# Patient Record
Sex: Female | Born: 1955 | Race: Black or African American | Hispanic: No | Smoking: Never smoker
Health system: Southern US, Community
[De-identification: ages and names within clinical notes are randomized; demographics above are authoritative.]

## PROBLEM LIST (undated history)

## (undated) DIAGNOSIS — K219 Gastro-esophageal reflux disease without esophagitis: Secondary | ICD-10-CM

## (undated) DIAGNOSIS — B20 Human immunodeficiency virus [HIV] disease: Secondary | ICD-10-CM

## (undated) DIAGNOSIS — I10 Essential (primary) hypertension: Secondary | ICD-10-CM

## (undated) HISTORY — PX: ECTOPIC PREGNANCY SURGERY: SHX613

## (undated) HISTORY — PX: UTERINE FIBROID SURGERY: SHX826

## (undated) HISTORY — PX: BUNIONECTOMY: SHX129

---

## 1997-08-10 ENCOUNTER — Emergency Department (HOSPITAL_COMMUNITY): Admission: EM | Admit: 1997-08-10 | Discharge: 1997-08-10 | Payer: Self-pay | Admitting: Emergency Medicine

## 1998-02-24 ENCOUNTER — Emergency Department (HOSPITAL_COMMUNITY): Admission: EM | Admit: 1998-02-24 | Discharge: 1998-02-24 | Payer: Self-pay | Admitting: Emergency Medicine

## 1999-07-22 ENCOUNTER — Encounter: Payer: Self-pay | Admitting: Emergency Medicine

## 1999-07-22 ENCOUNTER — Emergency Department (HOSPITAL_COMMUNITY): Admission: EM | Admit: 1999-07-22 | Discharge: 1999-07-22 | Payer: Self-pay | Admitting: Emergency Medicine

## 1999-11-10 ENCOUNTER — Emergency Department (HOSPITAL_COMMUNITY): Admission: EM | Admit: 1999-11-10 | Discharge: 1999-11-10 | Payer: Self-pay | Admitting: Emergency Medicine

## 2000-01-18 ENCOUNTER — Emergency Department (HOSPITAL_COMMUNITY): Admission: EM | Admit: 2000-01-18 | Discharge: 2000-01-18 | Payer: Self-pay | Admitting: *Deleted

## 2001-06-22 ENCOUNTER — Emergency Department (HOSPITAL_COMMUNITY): Admission: EM | Admit: 2001-06-22 | Discharge: 2001-06-22 | Payer: Self-pay

## 2001-07-23 ENCOUNTER — Encounter: Admission: RE | Admit: 2001-07-23 | Discharge: 2001-07-23 | Payer: Self-pay | Admitting: Obstetrics and Gynecology

## 2001-07-23 ENCOUNTER — Other Ambulatory Visit: Admission: RE | Admit: 2001-07-23 | Discharge: 2001-07-23 | Payer: Self-pay | Admitting: Obstetrics and Gynecology

## 2001-11-20 ENCOUNTER — Emergency Department (HOSPITAL_COMMUNITY): Admission: EM | Admit: 2001-11-20 | Discharge: 2001-11-20 | Payer: Self-pay | Admitting: Emergency Medicine

## 2002-07-14 ENCOUNTER — Emergency Department (HOSPITAL_COMMUNITY): Admission: EM | Admit: 2002-07-14 | Discharge: 2002-07-14 | Payer: Self-pay | Admitting: Emergency Medicine

## 2003-01-24 ENCOUNTER — Ambulatory Visit (HOSPITAL_COMMUNITY): Admission: RE | Admit: 2003-01-24 | Discharge: 2003-01-24 | Payer: Self-pay | Admitting: Obstetrics and Gynecology

## 2003-02-22 ENCOUNTER — Encounter (INDEPENDENT_AMBULATORY_CARE_PROVIDER_SITE_OTHER): Payer: Self-pay | Admitting: *Deleted

## 2003-02-22 ENCOUNTER — Encounter: Admission: RE | Admit: 2003-02-22 | Discharge: 2003-02-22 | Payer: Self-pay | Admitting: Obstetrics and Gynecology

## 2003-08-06 ENCOUNTER — Emergency Department (HOSPITAL_COMMUNITY): Admission: EM | Admit: 2003-08-06 | Discharge: 2003-08-06 | Payer: Self-pay | Admitting: Emergency Medicine

## 2003-10-30 ENCOUNTER — Emergency Department (HOSPITAL_COMMUNITY): Admission: EM | Admit: 2003-10-30 | Discharge: 2003-10-30 | Payer: Self-pay | Admitting: Emergency Medicine

## 2004-01-18 ENCOUNTER — Emergency Department (HOSPITAL_COMMUNITY): Admission: EM | Admit: 2004-01-18 | Discharge: 2004-01-18 | Payer: Self-pay | Admitting: Emergency Medicine

## 2004-01-27 ENCOUNTER — Ambulatory Visit: Payer: Self-pay | Admitting: Family Medicine

## 2004-09-21 ENCOUNTER — Emergency Department (HOSPITAL_COMMUNITY): Admission: EM | Admit: 2004-09-21 | Discharge: 2004-09-21 | Payer: Self-pay | Admitting: Family Medicine

## 2004-11-23 ENCOUNTER — Emergency Department (HOSPITAL_COMMUNITY): Admission: EM | Admit: 2004-11-23 | Discharge: 2004-11-23 | Payer: Self-pay | Admitting: Emergency Medicine

## 2004-12-17 ENCOUNTER — Emergency Department (HOSPITAL_COMMUNITY): Admission: EM | Admit: 2004-12-17 | Discharge: 2004-12-17 | Payer: Self-pay | Admitting: Emergency Medicine

## 2005-02-04 ENCOUNTER — Emergency Department (HOSPITAL_COMMUNITY): Admission: EM | Admit: 2005-02-04 | Discharge: 2005-02-04 | Payer: Self-pay | Admitting: Family Medicine

## 2005-03-11 DIAGNOSIS — Z21 Asymptomatic human immunodeficiency virus [HIV] infection status: Secondary | ICD-10-CM

## 2005-03-11 DIAGNOSIS — B20 Human immunodeficiency virus [HIV] disease: Secondary | ICD-10-CM

## 2005-03-11 HISTORY — DX: Asymptomatic human immunodeficiency virus (hiv) infection status: Z21

## 2005-03-11 HISTORY — DX: Human immunodeficiency virus (HIV) disease: B20

## 2005-03-24 ENCOUNTER — Emergency Department (HOSPITAL_COMMUNITY): Admission: EM | Admit: 2005-03-24 | Discharge: 2005-03-24 | Payer: Self-pay | Admitting: Emergency Medicine

## 2005-05-08 ENCOUNTER — Emergency Department (HOSPITAL_COMMUNITY): Admission: EM | Admit: 2005-05-08 | Discharge: 2005-05-08 | Payer: Self-pay | Admitting: Emergency Medicine

## 2005-05-21 ENCOUNTER — Emergency Department (HOSPITAL_COMMUNITY): Admission: EM | Admit: 2005-05-21 | Discharge: 2005-05-21 | Payer: Self-pay | Admitting: Emergency Medicine

## 2005-05-23 ENCOUNTER — Encounter: Admission: RE | Admit: 2005-05-23 | Discharge: 2005-05-23 | Payer: Self-pay | Admitting: Internal Medicine

## 2005-05-23 ENCOUNTER — Encounter (INDEPENDENT_AMBULATORY_CARE_PROVIDER_SITE_OTHER): Payer: Self-pay | Admitting: *Deleted

## 2005-05-23 ENCOUNTER — Ambulatory Visit: Payer: Self-pay | Admitting: Internal Medicine

## 2005-05-31 ENCOUNTER — Ambulatory Visit: Payer: Self-pay | Admitting: Cardiology

## 2005-06-01 ENCOUNTER — Inpatient Hospital Stay (HOSPITAL_COMMUNITY): Admission: EM | Admit: 2005-06-01 | Discharge: 2005-06-07 | Payer: Self-pay | Admitting: Emergency Medicine

## 2005-06-01 ENCOUNTER — Ambulatory Visit: Payer: Self-pay | Admitting: Internal Medicine

## 2005-06-01 ENCOUNTER — Ambulatory Visit: Payer: Self-pay | Admitting: Emergency Medicine

## 2005-06-01 ENCOUNTER — Ambulatory Visit: Payer: Self-pay | Admitting: Infectious Diseases

## 2005-06-04 ENCOUNTER — Encounter: Payer: Self-pay | Admitting: Cardiology

## 2005-06-18 ENCOUNTER — Ambulatory Visit: Payer: Self-pay | Admitting: Internal Medicine

## 2005-06-23 ENCOUNTER — Inpatient Hospital Stay (HOSPITAL_COMMUNITY): Admission: EM | Admit: 2005-06-23 | Discharge: 2005-06-26 | Payer: Self-pay | Admitting: Emergency Medicine

## 2005-06-29 ENCOUNTER — Emergency Department (HOSPITAL_COMMUNITY): Admission: EM | Admit: 2005-06-29 | Discharge: 2005-06-29 | Payer: Self-pay | Admitting: Emergency Medicine

## 2005-07-02 ENCOUNTER — Ambulatory Visit: Payer: Self-pay | Admitting: Internal Medicine

## 2005-07-04 ENCOUNTER — Ambulatory Visit: Payer: Self-pay | Admitting: Internal Medicine

## 2005-07-08 ENCOUNTER — Ambulatory Visit: Payer: Self-pay | Admitting: Internal Medicine

## 2005-08-07 ENCOUNTER — Ambulatory Visit: Payer: Self-pay | Admitting: Internal Medicine

## 2005-08-08 ENCOUNTER — Ambulatory Visit: Payer: Self-pay | Admitting: Internal Medicine

## 2005-08-22 ENCOUNTER — Ambulatory Visit: Payer: Self-pay | Admitting: Internal Medicine

## 2005-09-12 ENCOUNTER — Ambulatory Visit: Payer: Self-pay | Admitting: Internal Medicine

## 2005-10-10 ENCOUNTER — Ambulatory Visit: Payer: Self-pay | Admitting: Obstetrics & Gynecology

## 2005-10-10 ENCOUNTER — Encounter (INDEPENDENT_AMBULATORY_CARE_PROVIDER_SITE_OTHER): Payer: Self-pay | Admitting: Specialist

## 2005-10-21 ENCOUNTER — Ambulatory Visit: Payer: Self-pay | Admitting: Gynecology

## 2005-10-21 ENCOUNTER — Ambulatory Visit (HOSPITAL_COMMUNITY): Admission: RE | Admit: 2005-10-21 | Discharge: 2005-10-21 | Payer: Self-pay | Admitting: Family Medicine

## 2005-10-28 ENCOUNTER — Ambulatory Visit: Payer: Self-pay | Admitting: Internal Medicine

## 2005-10-28 ENCOUNTER — Encounter (INDEPENDENT_AMBULATORY_CARE_PROVIDER_SITE_OTHER): Payer: Self-pay | Admitting: *Deleted

## 2005-10-28 LAB — CONVERTED CEMR LAB
CD4 Count: 96 microliters
HIV 1 RNA Quant: 49 copies/mL

## 2005-11-20 ENCOUNTER — Ambulatory Visit: Payer: Self-pay | Admitting: Internal Medicine

## 2005-12-03 ENCOUNTER — Ambulatory Visit: Payer: Self-pay | Admitting: Gastroenterology

## 2005-12-05 ENCOUNTER — Ambulatory Visit: Payer: Self-pay | Admitting: Obstetrics & Gynecology

## 2005-12-05 ENCOUNTER — Encounter (INDEPENDENT_AMBULATORY_CARE_PROVIDER_SITE_OTHER): Payer: Self-pay | Admitting: Specialist

## 2005-12-05 ENCOUNTER — Other Ambulatory Visit: Admission: RE | Admit: 2005-12-05 | Discharge: 2005-12-05 | Payer: Self-pay | Admitting: Obstetrics and Gynecology

## 2005-12-13 ENCOUNTER — Ambulatory Visit: Payer: Self-pay | Admitting: Gastroenterology

## 2005-12-25 ENCOUNTER — Ambulatory Visit: Payer: Self-pay | Admitting: Internal Medicine

## 2005-12-25 ENCOUNTER — Ambulatory Visit: Payer: Self-pay | Admitting: Infectious Diseases

## 2005-12-25 ENCOUNTER — Encounter (INDEPENDENT_AMBULATORY_CARE_PROVIDER_SITE_OTHER): Payer: Self-pay | Admitting: *Deleted

## 2005-12-25 LAB — CONVERTED CEMR LAB
ALT: 13 units/L (ref 0–40)
Alkaline Phosphatase: 103 units/L (ref 39–117)
Bilirubin Urine: NEGATIVE
Calcium: 9.5 mg/dL (ref 8.4–10.5)
Cholesterol: 235 mg/dL — ABNORMAL HIGH (ref 0–200)
HDL: 74 mg/dL (ref >39)
HIV 1 RNA Quant: 52 copies/mL
Hgb urine dipstick: NEGATIVE
Phosphorus: 4.3 mg/dL (ref 2.3–4.6)
Specific Gravity, Urine: 1.017 (ref 1.005–1.03)
TSH: 1.397 microintl units/mL (ref 0.350–5.50)
Total CHOL/HDL Ratio: 3.2
pH: 6 (ref 5.0–8.0)

## 2006-01-13 DIAGNOSIS — O009 Unspecified ectopic pregnancy without intrauterine pregnancy: Secondary | ICD-10-CM

## 2006-01-13 DIAGNOSIS — Z9889 Other specified postprocedural states: Secondary | ICD-10-CM

## 2006-01-13 DIAGNOSIS — Z9189 Other specified personal risk factors, not elsewhere classified: Secondary | ICD-10-CM

## 2006-01-13 DIAGNOSIS — D649 Anemia, unspecified: Secondary | ICD-10-CM

## 2006-01-13 DIAGNOSIS — K219 Gastro-esophageal reflux disease without esophagitis: Secondary | ICD-10-CM | POA: Insufficient documentation

## 2006-01-13 DIAGNOSIS — F1420 Cocaine dependence, uncomplicated: Secondary | ICD-10-CM | POA: Insufficient documentation

## 2006-01-13 DIAGNOSIS — T783XXA Angioneurotic edema, initial encounter: Secondary | ICD-10-CM | POA: Insufficient documentation

## 2006-01-13 DIAGNOSIS — J309 Allergic rhinitis, unspecified: Secondary | ICD-10-CM | POA: Insufficient documentation

## 2006-01-13 DIAGNOSIS — O0384 Damage to pelvic organs following complete or unspecified spontaneous abortion: Secondary | ICD-10-CM | POA: Insufficient documentation

## 2006-01-13 DIAGNOSIS — A6000 Herpesviral infection of urogenital system, unspecified: Secondary | ICD-10-CM | POA: Insufficient documentation

## 2006-03-17 ENCOUNTER — Encounter (INDEPENDENT_AMBULATORY_CARE_PROVIDER_SITE_OTHER): Payer: Self-pay | Admitting: *Deleted

## 2006-03-17 ENCOUNTER — Ambulatory Visit: Payer: Self-pay | Admitting: Internal Medicine

## 2006-03-17 LAB — CONVERTED CEMR LAB
ALT: 9 units/L (ref 0–35)
Albumin: 4.1 g/dL (ref 3.5–5.2)
Bilirubin, Direct: <0.1 mg/dL (ref 0.0–0.3)
CD4 Count: 262 microliters
CO2: 28 meq/L (ref 19–32)
Calcium: 9.5 mg/dL (ref 8.4–10.5)
Chloride: 106 meq/L (ref 96–112)
Creatinine, Ser: 0.76 mg/dL (ref 0.40–1.20)
HIV 1 RNA Quant: 49 copies/mL
Total Protein: 8 g/dL (ref 6.0–8.3)
VLDL: 18 mg/dL (ref 0–40)

## 2006-05-05 ENCOUNTER — Encounter (INDEPENDENT_AMBULATORY_CARE_PROVIDER_SITE_OTHER): Payer: Self-pay | Admitting: *Deleted

## 2006-05-05 LAB — CONVERTED CEMR LAB: Pap Smear: ABNORMAL

## 2006-05-18 ENCOUNTER — Encounter (INDEPENDENT_AMBULATORY_CARE_PROVIDER_SITE_OTHER): Payer: Self-pay | Admitting: *Deleted

## 2006-06-10 ENCOUNTER — Telehealth: Payer: Self-pay | Admitting: Internal Medicine

## 2006-06-11 ENCOUNTER — Ambulatory Visit: Payer: Self-pay | Admitting: Internal Medicine

## 2006-06-11 ENCOUNTER — Encounter: Payer: Self-pay | Admitting: Internal Medicine

## 2006-06-11 ENCOUNTER — Encounter: Payer: Self-pay | Admitting: Infectious Diseases

## 2006-06-11 LAB — CONVERTED CEMR LAB
ALT: 10 units/L (ref 0–35)
AST: 17 units/L (ref 0–37)
Albumin: 4 g/dL (ref 3.5–5.2)
Bilirubin Urine: NEGATIVE
Calcium: 9.2 mg/dL (ref 8.4–10.5)
Cholesterol: 250 mg/dL — ABNORMAL HIGH (ref 0–200)
Creatinine, Ser: 0.82 mg/dL (ref 0.40–1.20)
Glucose, Bld: 73 mg/dL (ref 70–99)
HIV 1 RNA Quant: 49 copies/mL
Indirect Bilirubin: 0.3 mg/dL (ref 0.0–0.9)
Ketones, ur: NEGATIVE mg/dL
Phosphorus: 3.5 mg/dL (ref 2.3–4.6)
Protein, ur: NEGATIVE mg/dL
Sodium: 144 meq/L (ref 135–145)
Urine Glucose: NEGATIVE mg/dL
Urobilinogen, UA: 0.2 (ref 0.0–1.0)
VLDL: 20 mg/dL (ref 0–40)

## 2006-07-04 ENCOUNTER — Telehealth: Payer: Self-pay | Admitting: Internal Medicine

## 2006-08-01 ENCOUNTER — Telehealth: Payer: Self-pay | Admitting: Internal Medicine

## 2006-08-12 ENCOUNTER — Ambulatory Visit: Payer: Self-pay | Admitting: Internal Medicine

## 2006-08-12 DIAGNOSIS — M1990 Unspecified osteoarthritis, unspecified site: Secondary | ICD-10-CM | POA: Insufficient documentation

## 2006-08-12 DIAGNOSIS — B20 Human immunodeficiency virus [HIV] disease: Secondary | ICD-10-CM | POA: Insufficient documentation

## 2006-08-27 ENCOUNTER — Ambulatory Visit: Payer: Self-pay | Admitting: Internal Medicine

## 2006-08-28 ENCOUNTER — Encounter: Payer: Self-pay | Admitting: Internal Medicine

## 2006-08-28 LAB — CONVERTED CEMR LAB
BUN: 6 mg/dL (ref 6–23)
CO2: 25 meq/L (ref 19–32)
Calcium: 9.2 mg/dL (ref 8.4–10.5)
Creatinine, Ser: 0.7 mg/dL (ref 0.40–1.20)
Glucose, Bld: 94 mg/dL (ref 70–99)
HDL: 73 mg/dL (ref >39)
LDL Cholesterol: 147 mg/dL — ABNORMAL HIGH (ref 0–99)
Total CHOL/HDL Ratio: 3.2
Triglycerides: 76 mg/dL (ref <150)

## 2006-09-27 ENCOUNTER — Emergency Department (HOSPITAL_COMMUNITY): Admission: EM | Admit: 2006-09-27 | Discharge: 2006-09-27 | Payer: Self-pay | Admitting: Emergency Medicine

## 2006-11-19 ENCOUNTER — Ambulatory Visit: Payer: Self-pay | Admitting: Internal Medicine

## 2006-11-19 ENCOUNTER — Encounter: Payer: Self-pay | Admitting: Infectious Diseases

## 2006-11-19 LAB — CONVERTED CEMR LAB
CD4 Count: 260 microliters
HIV 1 RNA Quant: 49 copies/mL

## 2007-01-30 ENCOUNTER — Encounter (INDEPENDENT_AMBULATORY_CARE_PROVIDER_SITE_OTHER): Payer: Self-pay | Admitting: *Deleted

## 2007-02-17 ENCOUNTER — Ambulatory Visit: Payer: Self-pay | Admitting: Internal Medicine

## 2007-03-03 ENCOUNTER — Ambulatory Visit: Payer: Self-pay | Admitting: Internal Medicine

## 2007-03-09 ENCOUNTER — Encounter (INDEPENDENT_AMBULATORY_CARE_PROVIDER_SITE_OTHER): Payer: Self-pay | Admitting: *Deleted

## 2007-03-10 ENCOUNTER — Encounter (INDEPENDENT_AMBULATORY_CARE_PROVIDER_SITE_OTHER): Payer: Self-pay | Admitting: *Deleted

## 2007-05-14 ENCOUNTER — Ambulatory Visit: Payer: Self-pay | Admitting: Internal Medicine

## 2007-05-20 ENCOUNTER — Encounter: Payer: Self-pay | Admitting: Internal Medicine

## 2007-05-20 LAB — CONVERTED CEMR LAB: CD4 Count: 421 microliters

## 2007-07-22 ENCOUNTER — Emergency Department (HOSPITAL_COMMUNITY): Admission: EM | Admit: 2007-07-22 | Discharge: 2007-07-23 | Payer: Self-pay | Admitting: Emergency Medicine

## 2007-07-29 ENCOUNTER — Ambulatory Visit: Payer: Self-pay | Admitting: Internal Medicine

## 2007-08-25 ENCOUNTER — Telehealth: Payer: Self-pay | Admitting: Internal Medicine

## 2007-08-26 ENCOUNTER — Ambulatory Visit: Payer: Self-pay | Admitting: Internal Medicine

## 2007-08-26 DIAGNOSIS — R310 Gross hematuria: Secondary | ICD-10-CM

## 2007-08-26 LAB — CONVERTED CEMR LAB
Bilirubin Urine: NEGATIVE
Ketones, urine, test strip: NEGATIVE
Nitrite: NEGATIVE
Specific Gravity, Urine: 1.025
Urobilinogen, UA: 0.2
WBC Urine, dipstick: NEGATIVE
pH: 6.5

## 2007-08-27 ENCOUNTER — Encounter: Payer: Self-pay | Admitting: Internal Medicine

## 2007-09-01 ENCOUNTER — Ambulatory Visit: Payer: Self-pay | Admitting: *Deleted

## 2007-09-01 ENCOUNTER — Encounter: Payer: Self-pay | Admitting: Internal Medicine

## 2007-09-01 LAB — CONVERTED CEMR LAB: OCCULT 1: NEGATIVE

## 2007-09-21 ENCOUNTER — Ambulatory Visit: Payer: Self-pay | Admitting: Internal Medicine

## 2007-09-21 DIAGNOSIS — K029 Dental caries, unspecified: Secondary | ICD-10-CM | POA: Insufficient documentation

## 2007-10-01 ENCOUNTER — Ambulatory Visit: Payer: Self-pay | Admitting: Internal Medicine

## 2007-10-12 ENCOUNTER — Encounter (INDEPENDENT_AMBULATORY_CARE_PROVIDER_SITE_OTHER): Payer: Self-pay | Admitting: *Deleted

## 2007-10-15 ENCOUNTER — Encounter: Payer: Self-pay | Admitting: Internal Medicine

## 2007-11-03 ENCOUNTER — Telehealth: Payer: Self-pay | Admitting: Internal Medicine

## 2007-11-03 ENCOUNTER — Ambulatory Visit: Payer: Self-pay | Admitting: Internal Medicine

## 2007-11-05 ENCOUNTER — Telehealth (INDEPENDENT_AMBULATORY_CARE_PROVIDER_SITE_OTHER): Payer: Self-pay | Admitting: *Deleted

## 2007-11-11 ENCOUNTER — Encounter: Payer: Self-pay | Admitting: Family

## 2007-11-11 ENCOUNTER — Encounter (INDEPENDENT_AMBULATORY_CARE_PROVIDER_SITE_OTHER): Payer: Self-pay | Admitting: *Deleted

## 2007-11-11 ENCOUNTER — Ambulatory Visit: Payer: Self-pay | Admitting: Obstetrics and Gynecology

## 2007-11-11 ENCOUNTER — Encounter: Payer: Self-pay | Admitting: Internal Medicine

## 2007-11-20 ENCOUNTER — Ambulatory Visit (HOSPITAL_COMMUNITY): Admission: RE | Admit: 2007-11-20 | Discharge: 2007-11-20 | Payer: Self-pay | Admitting: Obstetrics & Gynecology

## 2007-12-08 ENCOUNTER — Encounter (INDEPENDENT_AMBULATORY_CARE_PROVIDER_SITE_OTHER): Payer: Self-pay | Admitting: *Deleted

## 2007-12-21 ENCOUNTER — Emergency Department (HOSPITAL_COMMUNITY): Admission: EM | Admit: 2007-12-21 | Discharge: 2007-12-21 | Payer: Self-pay | Admitting: Emergency Medicine

## 2007-12-23 ENCOUNTER — Emergency Department (HOSPITAL_COMMUNITY): Admission: EM | Admit: 2007-12-23 | Discharge: 2007-12-23 | Payer: Self-pay | Admitting: *Deleted

## 2008-01-14 ENCOUNTER — Ambulatory Visit: Payer: Self-pay | Admitting: Internal Medicine

## 2008-01-14 LAB — CONVERTED CEMR LAB
ALT: 34 units/L (ref 0–35)
AST: 39 units/L — ABNORMAL HIGH (ref 0–37)
Albumin: 3.7 g/dL (ref 3.5–5.2)
Alkaline Phosphatase: 69 units/L (ref 39–117)
BUN: 11 mg/dL (ref 6–23)
Basophils Absolute: 0 10*3/uL (ref 0.0–0.1)
Basophils Relative: 0 % (ref 0–1)
Eosinophils Absolute: 0.3 10*3/uL (ref 0.0–0.7)
HDL: 42 mg/dL (ref >39)
LDL Cholesterol: 86 mg/dL (ref 0–99)
MCHC: 31.3 g/dL (ref 30.0–36.0)
MCV: 92.6 fL (ref 78.0–100.0)
Neutrophils Relative %: 29 % — ABNORMAL LOW (ref 43–77)
Platelets: 190 10*3/uL (ref 150–400)
Potassium: 3.9 meq/L (ref 3.5–5.3)
RDW: 13.6 % (ref 11.5–15.5)
Sodium: 140 meq/L (ref 135–145)
Total Protein: 8.1 g/dL (ref 6.0–8.3)

## 2008-01-15 ENCOUNTER — Telehealth: Payer: Self-pay | Admitting: Internal Medicine

## 2008-01-19 ENCOUNTER — Telehealth (INDEPENDENT_AMBULATORY_CARE_PROVIDER_SITE_OTHER): Payer: Self-pay | Admitting: *Deleted

## 2008-02-11 ENCOUNTER — Telehealth (INDEPENDENT_AMBULATORY_CARE_PROVIDER_SITE_OTHER): Payer: Self-pay | Admitting: *Deleted

## 2008-02-13 ENCOUNTER — Emergency Department (HOSPITAL_COMMUNITY): Admission: EM | Admit: 2008-02-13 | Discharge: 2008-02-13 | Payer: Self-pay | Admitting: Emergency Medicine

## 2008-03-14 ENCOUNTER — Telehealth (INDEPENDENT_AMBULATORY_CARE_PROVIDER_SITE_OTHER): Payer: Self-pay | Admitting: *Deleted

## 2008-03-22 ENCOUNTER — Ambulatory Visit: Payer: Self-pay | Admitting: Internal Medicine

## 2008-03-22 DIAGNOSIS — F329 Major depressive disorder, single episode, unspecified: Secondary | ICD-10-CM

## 2008-03-24 ENCOUNTER — Telehealth: Payer: Self-pay | Admitting: Licensed Clinical Social Worker

## 2008-03-28 ENCOUNTER — Encounter: Payer: Self-pay | Admitting: Licensed Clinical Social Worker

## 2008-03-30 ENCOUNTER — Encounter (INDEPENDENT_AMBULATORY_CARE_PROVIDER_SITE_OTHER): Payer: Self-pay | Admitting: *Deleted

## 2008-04-06 ENCOUNTER — Ambulatory Visit: Payer: Self-pay | Admitting: Internal Medicine

## 2008-04-08 ENCOUNTER — Telehealth: Payer: Self-pay | Admitting: Internal Medicine

## 2008-04-15 ENCOUNTER — Telehealth (INDEPENDENT_AMBULATORY_CARE_PROVIDER_SITE_OTHER): Payer: Self-pay | Admitting: *Deleted

## 2008-04-25 ENCOUNTER — Encounter (INDEPENDENT_AMBULATORY_CARE_PROVIDER_SITE_OTHER): Payer: Self-pay | Admitting: *Deleted

## 2008-05-16 ENCOUNTER — Telehealth (INDEPENDENT_AMBULATORY_CARE_PROVIDER_SITE_OTHER): Payer: Self-pay | Admitting: *Deleted

## 2008-06-13 ENCOUNTER — Telehealth (INDEPENDENT_AMBULATORY_CARE_PROVIDER_SITE_OTHER): Payer: Self-pay | Admitting: *Deleted

## 2008-06-20 ENCOUNTER — Ambulatory Visit: Payer: Self-pay | Admitting: Internal Medicine

## 2008-06-20 ENCOUNTER — Encounter: Payer: Self-pay | Admitting: Internal Medicine

## 2008-06-20 LAB — CONVERTED CEMR LAB
Alkaline Phosphatase: 94 units/L (ref 39–117)
BUN: 11 mg/dL (ref 6–23)
Cholesterol: 200 mg/dL (ref 0–200)
Creatinine, Ser: 0.69 mg/dL (ref 0.40–1.20)
GFR calc non Af Amer: >60 mL/min (ref >60)
Glucose, Bld: 81 mg/dL (ref 70–99)
HDL: 56 mg/dL (ref >39)
Total Bilirubin: 0.3 mg/dL (ref 0.3–1.2)
Total CHOL/HDL Ratio: 3.6

## 2008-06-23 ENCOUNTER — Telehealth (INDEPENDENT_AMBULATORY_CARE_PROVIDER_SITE_OTHER): Payer: Self-pay | Admitting: *Deleted

## 2008-06-29 ENCOUNTER — Telehealth: Payer: Self-pay | Admitting: Internal Medicine

## 2008-07-06 ENCOUNTER — Telehealth (INDEPENDENT_AMBULATORY_CARE_PROVIDER_SITE_OTHER): Payer: Self-pay | Admitting: *Deleted

## 2008-07-11 ENCOUNTER — Encounter: Payer: Self-pay | Admitting: Internal Medicine

## 2008-08-02 ENCOUNTER — Telehealth (INDEPENDENT_AMBULATORY_CARE_PROVIDER_SITE_OTHER): Payer: Self-pay | Admitting: *Deleted

## 2008-08-30 ENCOUNTER — Ambulatory Visit: Payer: Self-pay | Admitting: *Deleted

## 2008-08-30 ENCOUNTER — Encounter (INDEPENDENT_AMBULATORY_CARE_PROVIDER_SITE_OTHER): Payer: Self-pay | Admitting: Emergency Medicine

## 2008-08-30 ENCOUNTER — Inpatient Hospital Stay (HOSPITAL_COMMUNITY): Admission: EM | Admit: 2008-08-30 | Discharge: 2008-09-02 | Payer: Self-pay | Admitting: Emergency Medicine

## 2008-09-01 ENCOUNTER — Ambulatory Visit: Payer: Self-pay | Admitting: Infectious Disease

## 2008-09-02 ENCOUNTER — Encounter (INDEPENDENT_AMBULATORY_CARE_PROVIDER_SITE_OTHER): Payer: Self-pay | Admitting: Internal Medicine

## 2008-09-06 ENCOUNTER — Telehealth (INDEPENDENT_AMBULATORY_CARE_PROVIDER_SITE_OTHER): Payer: Self-pay | Admitting: *Deleted

## 2008-09-08 ENCOUNTER — Telehealth: Payer: Self-pay | Admitting: Internal Medicine

## 2008-09-08 ENCOUNTER — Emergency Department (HOSPITAL_COMMUNITY): Admission: EM | Admit: 2008-09-08 | Discharge: 2008-09-08 | Payer: Self-pay | Admitting: Emergency Medicine

## 2008-09-14 ENCOUNTER — Ambulatory Visit: Payer: Self-pay | Admitting: Internal Medicine

## 2008-09-14 DIAGNOSIS — R21 Rash and other nonspecific skin eruption: Secondary | ICD-10-CM | POA: Insufficient documentation

## 2008-09-14 DIAGNOSIS — A879 Viral meningitis, unspecified: Secondary | ICD-10-CM | POA: Insufficient documentation

## 2008-09-14 LAB — CONVERTED CEMR LAB: HIV-1 RNA Quant, Log: 4.71 — ABNORMAL HIGH (ref <1.68)

## 2008-09-16 ENCOUNTER — Telehealth (INDEPENDENT_AMBULATORY_CARE_PROVIDER_SITE_OTHER): Payer: Self-pay | Admitting: *Deleted

## 2008-10-03 ENCOUNTER — Telehealth: Payer: Self-pay | Admitting: Internal Medicine

## 2008-10-04 ENCOUNTER — Telehealth (INDEPENDENT_AMBULATORY_CARE_PROVIDER_SITE_OTHER): Payer: Self-pay | Admitting: *Deleted

## 2008-10-14 ENCOUNTER — Ambulatory Visit: Payer: Self-pay | Admitting: Internal Medicine

## 2008-10-14 LAB — CONVERTED CEMR LAB
ALT: 13 units/L (ref 0–35)
AST: 17 units/L (ref 0–37)
Basophils Absolute: 0 10*3/uL (ref 0.0–0.1)
CO2: 23 meq/L (ref 19–32)
Calcium: 8.8 mg/dL (ref 8.4–10.5)
Chloride: 108 meq/L (ref 96–112)
Eosinophils Absolute: 0.1 10*3/uL (ref 0.0–0.7)
HDL: 57 mg/dL (ref >39)
LDL Cholesterol: 127 mg/dL — ABNORMAL HIGH (ref 0–99)
Lymphs Abs: 0.8 10*3/uL (ref 0.7–4.0)
MCV: 92.3 fL (ref >=78.0)
Neutrophils Relative %: 50 % (ref 43–77)
Platelets: 204 10*3/uL (ref 150–400)
RDW: 14 % (ref 11.5–15.5)
Sodium: 142 meq/L (ref 135–145)
Total Bilirubin: 0.4 mg/dL (ref 0.3–1.2)
Total Protein: 7.6 g/dL (ref 6.0–8.3)
WBC: 2.5 10*3/uL — ABNORMAL LOW (ref 4.0–10.5)

## 2008-10-18 ENCOUNTER — Ambulatory Visit: Payer: Self-pay | Admitting: Internal Medicine

## 2008-10-21 ENCOUNTER — Telehealth (INDEPENDENT_AMBULATORY_CARE_PROVIDER_SITE_OTHER): Payer: Self-pay | Admitting: *Deleted

## 2008-11-17 ENCOUNTER — Telehealth (INDEPENDENT_AMBULATORY_CARE_PROVIDER_SITE_OTHER): Payer: Self-pay | Admitting: *Deleted

## 2008-11-30 ENCOUNTER — Ambulatory Visit: Payer: Self-pay | Admitting: Internal Medicine

## 2008-11-30 ENCOUNTER — Telehealth (INDEPENDENT_AMBULATORY_CARE_PROVIDER_SITE_OTHER): Payer: Self-pay | Admitting: *Deleted

## 2008-11-30 LAB — CONVERTED CEMR LAB
Albumin: 3.9 g/dL (ref 3.5–5.2)
BUN: 8 mg/dL (ref 6–23)
CO2: 25 meq/L (ref 19–32)
Calcium: 9 mg/dL (ref 8.4–10.5)
Chloride: 108 meq/L (ref 96–112)
Creatinine, Ser: 0.67 mg/dL (ref 0.40–1.20)
Eosinophils Absolute: 0.1 10*3/uL (ref 0.0–0.7)
Eosinophils Relative: 2 % (ref 0–5)
HCT: 36.8 % (ref 36.0–46.0)
HIV 1 RNA Quant: 100000 copies/mL — ABNORMAL HIGH (ref <48)
Hemoglobin: 11.5 g/dL — ABNORMAL LOW (ref 12.0–15.0)
Lymphocytes Relative: 20 % (ref 12–46)
Lymphs Abs: 0.6 10*3/uL — ABNORMAL LOW (ref 0.7–4.0)
MCV: 93.6 fL (ref >=78.0)
Monocytes Absolute: 0.5 10*3/uL (ref 0.1–1.0)
Monocytes Relative: 17 % — ABNORMAL HIGH (ref 3–12)
Potassium: 3.6 meq/L (ref 3.5–5.3)
RBC: 3.93 M/uL (ref 3.87–5.11)
WBC: 2.9 10*3/uL — ABNORMAL LOW (ref 4.0–10.5)

## 2008-12-20 ENCOUNTER — Ambulatory Visit: Payer: Self-pay | Admitting: Internal Medicine

## 2008-12-23 ENCOUNTER — Telehealth (INDEPENDENT_AMBULATORY_CARE_PROVIDER_SITE_OTHER): Payer: Self-pay | Admitting: *Deleted

## 2008-12-23 ENCOUNTER — Ambulatory Visit: Payer: Self-pay | Admitting: Internal Medicine

## 2008-12-28 ENCOUNTER — Ambulatory Visit: Payer: Self-pay | Admitting: Internal Medicine

## 2009-01-18 ENCOUNTER — Telehealth (INDEPENDENT_AMBULATORY_CARE_PROVIDER_SITE_OTHER): Payer: Self-pay | Admitting: *Deleted

## 2009-01-24 ENCOUNTER — Ambulatory Visit: Payer: Self-pay | Admitting: Internal Medicine

## 2009-01-24 LAB — CONVERTED CEMR LAB
AST: 18 units/L (ref 0–37)
Albumin: 4.1 g/dL (ref 3.5–5.2)
Alkaline Phosphatase: 72 units/L (ref 39–117)
BUN: 11 mg/dL (ref 6–23)
CD4 Count: 97 microliters
Creatinine, Urine: 340.5 mg/dL
Glucose, Bld: 91 mg/dL (ref 70–99)
HDL: 51 mg/dL (ref >39)
HIV 1 RNA Quant: 60658 copies/mL
LDL Cholesterol: 133 mg/dL — ABNORMAL HIGH (ref 0–99)
Potassium: 3.7 meq/L (ref 3.5–5.3)
Sodium: 142 meq/L (ref 135–145)
Total Bilirubin: 0.3 mg/dL (ref 0.3–1.2)
Total Protein: 8.2 g/dL (ref 6.0–8.3)
Triglycerides: 75 mg/dL (ref <150)
VLDL: 15 mg/dL (ref 0–40)

## 2009-01-25 ENCOUNTER — Ambulatory Visit: Payer: Self-pay | Admitting: Obstetrics and Gynecology

## 2009-01-25 DIAGNOSIS — R87622 Low grade squamous intraepithelial lesion on cytologic smear of vagina (LGSIL): Secondary | ICD-10-CM | POA: Insufficient documentation

## 2009-01-30 ENCOUNTER — Ambulatory Visit: Payer: Self-pay | Admitting: Internal Medicine

## 2009-01-30 LAB — CONVERTED CEMR LAB: Hep B Core Total Ab: NEGATIVE

## 2009-02-06 ENCOUNTER — Encounter (INDEPENDENT_AMBULATORY_CARE_PROVIDER_SITE_OTHER): Payer: Self-pay | Admitting: *Deleted

## 2009-02-16 ENCOUNTER — Telehealth (INDEPENDENT_AMBULATORY_CARE_PROVIDER_SITE_OTHER): Payer: Self-pay | Admitting: *Deleted

## 2009-02-22 ENCOUNTER — Other Ambulatory Visit: Admission: RE | Admit: 2009-02-22 | Discharge: 2009-02-22 | Payer: Self-pay | Admitting: Obstetrics and Gynecology

## 2009-02-22 ENCOUNTER — Ambulatory Visit: Payer: Self-pay | Admitting: Obstetrics & Gynecology

## 2009-02-24 ENCOUNTER — Telehealth (INDEPENDENT_AMBULATORY_CARE_PROVIDER_SITE_OTHER): Payer: Self-pay | Admitting: *Deleted

## 2009-03-15 ENCOUNTER — Telehealth (INDEPENDENT_AMBULATORY_CARE_PROVIDER_SITE_OTHER): Payer: Self-pay | Admitting: *Deleted

## 2009-03-28 ENCOUNTER — Encounter: Payer: Self-pay | Admitting: Internal Medicine

## 2009-03-28 ENCOUNTER — Telehealth (INDEPENDENT_AMBULATORY_CARE_PROVIDER_SITE_OTHER): Payer: Self-pay | Admitting: *Deleted

## 2009-03-29 ENCOUNTER — Encounter (INDEPENDENT_AMBULATORY_CARE_PROVIDER_SITE_OTHER): Payer: Self-pay | Admitting: *Deleted

## 2009-03-29 ENCOUNTER — Ambulatory Visit: Payer: Self-pay | Admitting: Internal Medicine

## 2009-03-29 DIAGNOSIS — I10 Essential (primary) hypertension: Secondary | ICD-10-CM | POA: Insufficient documentation

## 2009-03-29 LAB — CONVERTED CEMR LAB
ALT: 21 units/L (ref 0–35)
AST: 23 units/L (ref 0–37)
CO2: 25 meq/L (ref 19–32)
Calcium: 8.6 mg/dL (ref 8.4–10.5)
Chloride: 107 meq/L (ref 96–112)
Creatinine, Ser: 0.66 mg/dL (ref 0.40–1.20)
HIV-1 RNA Quant, Log: 5.16 — ABNORMAL HIGH (ref <1.68)
Lymphocytes Relative: 42 % (ref 12–46)
Lymphs Abs: 1 10*3/uL (ref 0.7–4.0)
Monocytes Relative: 17 % — ABNORMAL HIGH (ref 3–12)
Neutro Abs: 0.8 10*3/uL — ABNORMAL LOW (ref 1.7–7.7)
Neutrophils Relative %: 37 % — ABNORMAL LOW (ref 43–77)
Platelets: 195 10*3/uL (ref 150–400)
Potassium: 3.8 meq/L (ref 3.5–5.3)
RBC: 4.1 M/uL (ref 3.87–5.11)
Sodium: 143 meq/L (ref 135–145)
Total CHOL/HDL Ratio: 3.5
Total Protein: 7.9 g/dL (ref 6.0–8.3)
WBC: 2.3 10*3/uL — ABNORMAL LOW (ref 4.0–10.5)

## 2009-03-30 ENCOUNTER — Telehealth: Payer: Self-pay | Admitting: Internal Medicine

## 2009-03-31 ENCOUNTER — Telehealth (INDEPENDENT_AMBULATORY_CARE_PROVIDER_SITE_OTHER): Payer: Self-pay | Admitting: *Deleted

## 2009-04-12 ENCOUNTER — Telehealth (INDEPENDENT_AMBULATORY_CARE_PROVIDER_SITE_OTHER): Payer: Self-pay | Admitting: *Deleted

## 2009-04-25 ENCOUNTER — Encounter (INDEPENDENT_AMBULATORY_CARE_PROVIDER_SITE_OTHER): Payer: Self-pay | Admitting: *Deleted

## 2009-05-02 ENCOUNTER — Ambulatory Visit: Payer: Self-pay | Admitting: Internal Medicine

## 2009-05-08 ENCOUNTER — Ambulatory Visit (HOSPITAL_COMMUNITY): Admission: RE | Admit: 2009-05-08 | Discharge: 2009-05-08 | Payer: Self-pay | Admitting: Internal Medicine

## 2009-05-09 ENCOUNTER — Telehealth (INDEPENDENT_AMBULATORY_CARE_PROVIDER_SITE_OTHER): Payer: Self-pay | Admitting: *Deleted

## 2009-05-23 ENCOUNTER — Encounter: Payer: Self-pay | Admitting: Internal Medicine

## 2009-05-31 ENCOUNTER — Ambulatory Visit: Payer: Self-pay | Admitting: Internal Medicine

## 2009-05-31 LAB — CONVERTED CEMR LAB
ALT: 15 units/L (ref 0–35)
Albumin: 4.1 g/dL (ref 3.5–5.2)
Alkaline Phosphatase: 87 units/L (ref 39–117)
CO2: 26 meq/L (ref 19–32)
Cholesterol: 170 mg/dL (ref 0–200)
HIV 1 RNA Quant: 2123 copies/mL
LDL Cholesterol: 103 mg/dL — ABNORMAL HIGH (ref 0–99)
Potassium: 3.5 meq/L (ref 3.5–5.3)
Sodium: 143 meq/L (ref 135–145)
Total Bilirubin: 2.6 mg/dL — ABNORMAL HIGH (ref 0.3–1.2)
Total Protein: 7.8 g/dL (ref 6.0–8.3)
VLDL: 15 mg/dL (ref 0–40)

## 2009-06-01 ENCOUNTER — Telehealth (INDEPENDENT_AMBULATORY_CARE_PROVIDER_SITE_OTHER): Payer: Self-pay | Admitting: *Deleted

## 2009-06-16 ENCOUNTER — Ambulatory Visit: Payer: Self-pay | Admitting: Internal Medicine

## 2009-06-19 ENCOUNTER — Encounter (INDEPENDENT_AMBULATORY_CARE_PROVIDER_SITE_OTHER): Payer: Self-pay | Admitting: *Deleted

## 2009-06-27 ENCOUNTER — Telehealth (INDEPENDENT_AMBULATORY_CARE_PROVIDER_SITE_OTHER): Payer: Self-pay | Admitting: *Deleted

## 2009-07-04 ENCOUNTER — Ambulatory Visit: Payer: Self-pay | Admitting: Internal Medicine

## 2009-07-19 ENCOUNTER — Telehealth: Payer: Self-pay | Admitting: Internal Medicine

## 2009-07-26 ENCOUNTER — Telehealth (INDEPENDENT_AMBULATORY_CARE_PROVIDER_SITE_OTHER): Payer: Self-pay | Admitting: *Deleted

## 2009-08-15 ENCOUNTER — Ambulatory Visit: Payer: Self-pay | Admitting: Internal Medicine

## 2009-08-15 ENCOUNTER — Telehealth: Payer: Self-pay | Admitting: Internal Medicine

## 2009-08-31 ENCOUNTER — Encounter: Payer: Self-pay | Admitting: Internal Medicine

## 2009-09-05 ENCOUNTER — Encounter: Payer: Self-pay | Admitting: Internal Medicine

## 2009-09-07 ENCOUNTER — Ambulatory Visit: Payer: Self-pay | Admitting: Internal Medicine

## 2009-09-07 LAB — CONVERTED CEMR LAB
ALT: 13 units/L (ref 0–35)
Alkaline Phosphatase: 81 units/L (ref 39–117)
Basophils Absolute: 0 10*3/uL (ref 0.0–0.1)
Basophils Relative: 0 % (ref 0–1)
LDL Cholesterol: 109 mg/dL — ABNORMAL HIGH (ref 0–99)
MCHC: 32.2 g/dL (ref 30.0–36.0)
Neutro Abs: 2.2 10*3/uL (ref 1.7–7.7)
Neutrophils Relative %: 58 % (ref 43–77)
RDW: 13.4 % (ref 11.5–15.5)
Sodium: 142 meq/L (ref 135–145)
Total Bilirubin: 1.4 mg/dL — ABNORMAL HIGH (ref 0.3–1.2)
Total Protein: 7.4 g/dL (ref 6.0–8.3)
Triglycerides: 110 mg/dL (ref <150)
VLDL: 22 mg/dL (ref 0–40)

## 2009-09-18 ENCOUNTER — Encounter (INDEPENDENT_AMBULATORY_CARE_PROVIDER_SITE_OTHER): Payer: Self-pay | Admitting: *Deleted

## 2009-09-19 ENCOUNTER — Telehealth: Payer: Self-pay | Admitting: Internal Medicine

## 2009-09-28 ENCOUNTER — Encounter: Payer: Self-pay | Admitting: Internal Medicine

## 2009-10-05 ENCOUNTER — Ambulatory Visit: Payer: Self-pay | Admitting: Internal Medicine

## 2009-10-05 DIAGNOSIS — D4959 Neoplasm of unspecified behavior of other genitourinary organ: Secondary | ICD-10-CM

## 2009-10-11 ENCOUNTER — Telehealth (INDEPENDENT_AMBULATORY_CARE_PROVIDER_SITE_OTHER): Payer: Self-pay | Admitting: *Deleted

## 2009-10-19 ENCOUNTER — Telehealth: Payer: Self-pay | Admitting: Internal Medicine

## 2009-10-23 ENCOUNTER — Telehealth: Payer: Self-pay | Admitting: Internal Medicine

## 2009-10-27 ENCOUNTER — Encounter (INDEPENDENT_AMBULATORY_CARE_PROVIDER_SITE_OTHER): Payer: Self-pay | Admitting: *Deleted

## 2010-01-16 ENCOUNTER — Ambulatory Visit: Payer: Self-pay | Admitting: Internal Medicine

## 2010-01-16 ENCOUNTER — Encounter: Payer: Self-pay | Admitting: Internal Medicine

## 2010-01-16 DIAGNOSIS — M545 Low back pain: Secondary | ICD-10-CM

## 2010-01-16 LAB — CONVERTED CEMR LAB
Alkaline Phosphatase: 100 units/L (ref 39–117)
Glucose, Bld: 94 mg/dL (ref 70–99)
HDL: 51 mg/dL (ref >39)
HIV 1 RNA Quant: 46 copies/mL
LDL Cholesterol: 125 mg/dL — ABNORMAL HIGH (ref 0–99)
Sodium: 141 meq/L (ref 135–145)
Total Bilirubin: 1.8 mg/dL — ABNORMAL HIGH (ref 0.3–1.2)
Total CHOL/HDL Ratio: 3.8
Total Protein: 7.5 g/dL (ref 6.0–8.3)
Triglycerides: 90 mg/dL (ref <150)
VLDL: 18 mg/dL (ref 0–40)

## 2010-01-22 ENCOUNTER — Encounter: Payer: Self-pay | Admitting: Internal Medicine

## 2010-01-22 ENCOUNTER — Telehealth: Payer: Self-pay | Admitting: Internal Medicine

## 2010-01-23 ENCOUNTER — Encounter (INDEPENDENT_AMBULATORY_CARE_PROVIDER_SITE_OTHER): Payer: Self-pay | Admitting: *Deleted

## 2010-02-07 ENCOUNTER — Ambulatory Visit: Payer: Self-pay | Admitting: Internal Medicine

## 2010-02-07 LAB — CONVERTED CEMR LAB
BUN: 11 mg/dL (ref 6–23)
CO2: 30 meq/L (ref 19–32)
Chloride: 99 meq/L (ref 96–112)
Creatinine, Ser: 0.7 mg/dL (ref 0.40–1.20)
Glucose, Bld: 89 mg/dL (ref 70–99)

## 2010-02-19 ENCOUNTER — Encounter: Payer: Self-pay | Admitting: Internal Medicine

## 2010-04-08 LAB — CONVERTED CEMR LAB
ALT: 13 units/L (ref 0–35)
ALT: 18 units/L (ref 0–35)
AST: 17 units/L (ref 0–37)
AST: 19 units/L (ref 0–37)
Albumin: 4 g/dL (ref 3.5–5.2)
Albumin: 4.1 g/dL (ref 3.5–5.2)
Alkaline Phosphatase: 101 units/L (ref 39–117)
Alkaline Phosphatase: 115 units/L (ref 39–117)
Alkaline Phosphatase: 75 units/L (ref 39–117)
BUN: 12 mg/dL (ref 6–23)
BUN: 15 mg/dL (ref 6–23)
BUN: 9 mg/dL (ref 6–23)
Bilirubin Urine: NEGATIVE
Bilirubin, Direct: 0.1 mg/dL (ref 0.0–0.3)
Bilirubin, Direct: 0.1 mg/dL (ref 0.0–0.3)
Bilirubin, Direct: <0.1 mg/dL (ref 0.0–0.3)
CD4 Count: 251 microliters
CD4 Count: 293 microliters
CO2: 21 meq/L (ref 19–32)
CO2: 25 meq/L (ref 19–32)
CO2: 26 meq/L (ref 19–32)
CO2: 26 meq/L (ref 19–32)
Calcium: 9 mg/dL (ref 8.4–10.5)
Calcium: 9.5 mg/dL (ref 8.4–10.5)
Chloride: 106 meq/L (ref 96–112)
Chloride: 106 meq/L (ref 96–112)
Chloride: 107 meq/L (ref 96–112)
Cholesterol: 166 mg/dL (ref 0–200)
Cholesterol: 224 mg/dL — ABNORMAL HIGH (ref 0–200)
Creatinine, Ser: 0.65 mg/dL (ref 0.40–1.20)
Creatinine, Ser: 0.66 mg/dL (ref 0.40–1.20)
Creatinine, Ser: 0.68 mg/dL (ref 0.40–1.20)
Creatinine, Ser: 0.78 mg/dL (ref 0.40–1.20)
Creatinine, Urine: 211.1 mg/dL
Glucose, Bld: 90 mg/dL (ref 70–99)
Glucose, Bld: 93 mg/dL (ref 70–99)
HDL: 57 mg/dL (ref >39)
HDL: 73 mg/dL (ref >39)
HIV 1 RNA Quant: 135611 copies/mL
HIV 1 RNA Quant: 49 copies/mL
HIV 1 RNA Quant: 49 copies/mL
HIV 1 RNA Quant: 49 copies/mL
HIV 1 RNA Quant: 49 copies/mL
HIV-1 RNA Quant, Log: 2.97 — ABNORMAL HIGH (ref <1.68)
Hemoglobin, Urine: NEGATIVE
Hemoglobin, Urine: NEGATIVE
Hep B Core Total Ab: NEGATIVE
Indirect Bilirubin: 0.2 mg/dL (ref 0.0–0.9)
Indirect Bilirubin: 0.2 mg/dL (ref 0.0–0.9)
Indirect Bilirubin: 0.2 mg/dL (ref 0.0–0.9)
Indirect Bilirubin: 0.3 mg/dL (ref 0.0–0.9)
Ketones, ur: NEGATIVE mg/dL
LDL Cholesterol: 100 mg/dL — ABNORMAL HIGH (ref 0–99)
LDL Cholesterol: 136 mg/dL — ABNORMAL HIGH (ref 0–99)
LDL Cholesterol: 141 mg/dL — ABNORMAL HIGH (ref 0–99)
LDL Cholesterol: 154 mg/dL — ABNORMAL HIGH (ref 0–99)
Leukocytes, UA: NEGATIVE
Nitrite: NEGATIVE
Nitrite: NEGATIVE
Phosphorus: 3.8 mg/dL (ref 2.3–4.6)
Potassium: 3.6 meq/L (ref 3.5–5.3)
Potassium: 4.1 meq/L (ref 3.5–5.3)
Protein, ur: NEGATIVE mg/dL
Sodium: 141 meq/L (ref 135–145)
Sodium: 142 meq/L (ref 135–145)
Sodium: 143 meq/L (ref 135–145)
Specific Gravity, Urine: 1.021 (ref 1.005–1.03)
Specific Gravity, Urine: 1.024 (ref 1.005–1.03)
Total Bilirubin: 0.3 mg/dL (ref 0.3–1.2)
Total Bilirubin: 0.3 mg/dL (ref 0.3–1.2)
Total Bilirubin: 0.4 mg/dL (ref 0.3–1.2)
Total Bilirubin: 0.4 mg/dL (ref 0.3–1.2)
Total CHOL/HDL Ratio: 3
Total CHOL/HDL Ratio: 3.5
Total Protein, Urine: 7
Total Protein, Urine: 7
Total Protein: 7.7 g/dL (ref 6.0–8.3)
Total Protein: 8 g/dL (ref 6.0–8.3)
Total Protein: 8 g/dL (ref 6.0–8.3)
Total Protein: 8.2 g/dL (ref 6.0–8.3)
Total Protein: 8.2 g/dL (ref 6.0–8.3)
Triglycerides: 102 mg/dL (ref <150)
Triglycerides: 71 mg/dL (ref <150)
Triglycerides: 77 mg/dL (ref <150)
Urine Glucose: NEGATIVE mg/dL
Urobilinogen, UA: 0.2 (ref 0.0–1.0)
Urobilinogen, UA: 0.2 (ref 0.0–1.0)
Urobilinogen, UA: 0.2 (ref 0.0–1.0)
VLDL: 14 mg/dL (ref 0–40)
VLDL: 15 mg/dL (ref 0–40)
VLDL: 15 mg/dL (ref 0–40)
VLDL: 18 mg/dL (ref 0–40)

## 2010-04-09 ENCOUNTER — Ambulatory Visit: Admit: 2010-04-09 | Payer: Self-pay | Admitting: Internal Medicine

## 2010-04-10 NOTE — Consult Note (Addendum)
Summary: [facility]: Progress Note  [facility]: Progress Note   Imported By: Florinda Marker 10/11/2009 10:20:30  _____________________________________________________________________  External Attachment:    Type:   Image     Comment:   External Document

## 2010-04-12 NOTE — Progress Notes (Signed)
  Phone Note Outgoing Call Call back at [facility] Phone [phone]   Action Taken: Information Sent Summary of Call: Hattie Perch to let her know that Dr. Orvan Falconer and Dr. Lang Snow had spoken and that she did not need to come in the hospital at this time. I reinforced that she was taking her acyclovir 5 x day and using the cream that Dr. Lang Snow had prescribed. She says she is marking it on paper every time she takes it to keep up with the 5 doses/day. She is also being see n by Bjorn Loser the adherence nurse for her HIV meds. She doesn't feel like it has gotten any better and is due back to see Dr. Lang Snow on the 23rd of August. Initial call taken by: Deirdre Evener RN,  October 23, 2009 3:22 PM

## 2010-04-12 NOTE — Assessment & Plan Note (Signed)
Summary: F/U/VS   CC:  follow-up visit and Depression.  History of Present Illness: [name] [name] is in for her routine visit.  She is using her pill box and is not missing medications nearly as frequently as she had been.  She believes the only medicine she has missed was for Zithromax which she did not take on purpose because it upsets her stomach.  She is ready to restart a salvage regimen for her HIV infection.  Depression History:      The patient denies a depressed mood most of the day and a diminished interest in her usual daily activities.        Preventive Screening-Counseling & Management  Alcohol-Tobacco     Alcohol drinks/day: 0     Smoking Status: never  Caffeine-Diet-Exercise     Caffeine use/day: yes     Does Patient Exercise: no     Type of exercise: gym     Exercise (avg: min/session): >60     Times/week: 3  Hep-HIV-STD-Contraception     HIV Risk: no risk noted  Safety-Violence-Falls     Seat Belt Use: yes  Comments: pt refused condoms      Sexual History:  currently monogamous.        Drug Use:  former.     Prior Medication List:  MEPRON 750 MG/5ML SUSP (ATOVAQUONE) 10 ml once daily with food DIFLUCAN 100 MG TABS (FLUCONAZOLE) Take 1 tablet by mouth once a week ZITHROMAX 200 MG/5ML SUSR (AZITHROMYCIN) 30 ML by mouth once weekly VALTREX 500 MG TABS (VALACYCLOVIR HCL) Take 1 tablet by mouth two times a day as needed BENADRYL 25 MG CAPS (DIPHENHYDRAMINE HCL) Take 1 capsule by mouth four times a day as needed for itching   Current Allergies (reviewed today): ! PCN ! BACTRIM Social History: Drug Use:  former  Vital Signs:  Patient profile:   54 year old female Menstrual status:  postmenopausal Height:      61 inches (154.94 cm) Weight:      221.9 pounds (100.86 kg) BMI:     42.08 Temp:     97.0 degrees F (36.11 degrees C) oral Pulse rate:   102 / minute BP sitting:   159 / 88  (right arm)  Vitals Entered By: Wendall Mola CMA Duncan Dull)  (May 02, 2009 9:41 AM) CC: follow-up visit, Depression Is Patient Diabetic? No Pain Assessment Patient in pain? no      Nutritional Status BMI of > 30 = obese Nutritional Status Detail appetite "good"  Have you ever been in a relationship where you felt threatened, hurt or afraid?No   Does patient need assistance? Functional Status Self care Ambulation Normal Comments missed two doses of meds since last visit   Physical Exam  General:  alert and overweight-appearing.  she has gained 8 pounds. Mouth:  pharynx pink and moist, poor dentition, and teeth missing.   Psych:  normally interactive, good eye contact, not anxious appearing, and not depressed appearing.      Impression & Recommendations:  Problem # 1:  HIV INFECTION (ICD-042) I will start a new once daily regimen.  I have reviewed that regimen with her and instructed her on how to fill out her pill box.  She knows to call me if she has any problems tolerating her new regimen. Her updated medication list for this problem includes:    Diflucan 100 Mg Tabs (Fluconazole) .Marland Kitchen... Take 1 tablet by mouth once a week    Zithromax 200 Mg/35ml  Susr (Azithromycin) .Marland KitchenMarland KitchenMarland KitchenMarland Kitchen 30 ml by mouth once weekly    Valtrex 500 Mg Tabs (Valacyclovir hcl) .Marland Kitchen... 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 Patient Level III (99213)Future Orders: T-CD4SP ([facility]) (CD4SP) ... 06/13/2009 T-HIV Viral Load [id number]) ... 06/13/2009 T-Comprehensive Metabolic Panel [id number]) ... 06/13/2009 T-CBC w/Diff ([id number]) ... 06/13/2009 T-RPR (Syphilis) [phone]) ... 06/13/2009 T-Lipid Profile [phone]) ... 06/13/2009  Medications Added to  Medication List This Visit: 1)  Viread 300 Mg Tabs (Tenofovir disoproxil fumarate) .... Take 1 tablet by mouth once a day 2)  Ziagen 300 Mg Tabs (Abacavir sulfate) .... Take 2 tablets by mouth once a day 3)  Reyataz 300 Mg Caps (Atazanavir sulfate) .... Take 1 capsule by mouth once a day 4)  Norvir 100 Mg Tabs (Ritonavir) .... Take 1 tablet by mouth once a day  Patient Instructions: 1)  Please schedule a follow-up appointment in 2 months.  Prescriptions: NORVIR 100 MG TABS (RITONAVIR) Take 1 tablet by mouth once a day  #30 x 11   Entered and Authorized by:   Cliffton Asters MD   Signed by:   Cliffton Asters MD on 05/02/2009   Method used:   Print then Give to Patient   RxID:   [id number] REYATAZ 300 MG CAPS (ATAZANAVIR SULFATE) Take 1 capsule by mouth once a day  #30 x 11   Entered and Authorized by:   Cliffton Asters MD   Signed by:   Cliffton Asters MD on 05/02/2009   Method used:   Print then Give to Patient   RxID:   [id number] ZIAGEN 300 MG TABS (ABACAVIR SULFATE) Take 2 tablets by mouth once a day  #60 x 11   Entered and Authorized by:   Cliffton Asters MD   Signed by:   Cliffton Asters MD on 05/02/2009   Method used:   Print then Give to Patient   RxID:   [id number] VIREAD 300 MG TABS (TENOFOVIR DISOPROXIL FUMARATE) Take 1 tablet by mouth once a day  #30 x 11   Entered and Authorized by:   Cliffton Asters MD   Signed by:   Cliffton Asters MD on 05/02/2009   Method used:   Print then Give to Patient   RxID:   [id number]  Process Orders Check Orders Results:     [facility]: ABN not required for this insurance Tests Sent for requisitioning (May 02, 2009 10:07 AM):     06/13/2009: [facility] -- T-HIV Viral Load [phone] (signed)     06/13/2009: [facility] -- T-Comprehensive Metabolic Panel [[id number]] (signed)     06/13/2009: [facility] -- T-CBC w/Diff [[id number]] (signed)      06/13/2009: [facility] -- T-RPR (Syphilis) [phone] (signed)     06/13/2009: [facility] -- T-Lipid Profile [phone] (signed)

## 2010-04-12 NOTE — Progress Notes (Signed)
Summary: [facility]/pt assist meds arrived for Feb  Phone Note Refill Request      Prescriptions: DIFLUCAN 100 MG TABS (FLUCONAZOLE) Take 1 tablet by mouth once a week  #4 x 0   Entered by:   Paulo Fruit  BS,CPht II,MPH   Authorized by:   Cliffton Asters MD   Signed by:   Paulo Fruit  BS,CPht II,MPH on 04/12/2009   Method used:   Samples Given   RxID:   [id number] MEPRON 750 MG/5ML SUSP (ATOVAQUONE) 10 ml once daily with food  #300 x 0   Entered by:   Paulo Fruit  BS,CPht II,MPH   Authorized by:   Cliffton Asters MD   Signed by:   Paulo Fruit  BS,CPht II,MPH on 04/12/2009   Method used:   Samples Given   RxID:   [id number]  Patient Assist Medication Verification: Medication name: Fluconazole 100mg  RX # [id number] Tech approval:MLD   Patient Assist Medication Verification: Medication:Mepron 750mg /69ml Lot# [id number] Exp Date:Sep 12 Tech approval:MLD Call placed to patient with message that assistance medications are ready for pick-up. Paulo Fruit  BS,CPht II,MPH  April 12, 2009 2:51 PM

## 2010-04-12 NOTE — Assessment & Plan Note (Signed)
Summary: F/U/VS   CC:  follow-up visit.  History of Present Illness: [name] [name] is in for her routine visit.  She has been taking her Mepron, Diflucan, and Zithromax and has not missed any doses since her last visit in October.  She finds the Zithromax to be difficult because of the large size of the tablets.  When I asked her why she has been able to take these medicines and not miss doses but was having a great deal of difficulty missing doses of her HIV medicines she tells me that it is due to having to take the evening dose of the Retrovir.  She feels as if he's she could switch preparations of Zithromax and have a once daily HIV regimen she would do much better.  She has been working on adherence with Larey Brick in finds him to be very nice and helpful.  Rosanne Ashing noted that her blood pressure was up yesterday.  She says that she does salt her food fairly heavily and eats a lot of prepared foods with salt in it.  She has not been exercising regularly due to the cold weather and her husband's medical condition.  She denies any alcohol, cocaine or other drug use.  Preventive Screening-Counseling & Management  Alcohol-Tobacco     Alcohol drinks/day: 0     Smoking Status: never  Caffeine-Diet-Exercise     Caffeine use/day: yes     Does Patient Exercise: no     Type of exercise: gym     Exercise (avg: min/session): >60     Times/week: 3  Hep-HIV-STD-Contraception     HIV Risk: no risk noted  Safety-Violence-Falls     Seat Belt Use: yes  Comments: condoms declined      Sexual History:  currently monogamous.        Drug Use:  never.     Updated Prior Medication List: MEPRON 750 MG/5ML SUSP (ATOVAQUONE) 10 ml once daily with food DIFLUCAN 100 MG TABS (FLUCONAZOLE) Take 1 tablet by mouth once a week ZITHROMAX 200 MG/5ML SUSR (AZITHROMYCIN) 30 ML by mouth daily VALTREX 500 MG TABS (VALACYCLOVIR HCL) Take 1 tablet by mouth two times a day as needed BENADRYL 25 MG CAPS  (DIPHENHYDRAMINE HCL) Take 1 capsule by mouth four times a day as needed for itching  Current Allergies (reviewed today): ! PCN ! 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 No Pain Assessment Patient in pain? no      Nutritional Status BMI of > 30 = obese Nutritional Status Detail appetite "too good"  Have you ever been in a relationship where you felt threatened, hurt or afraid?No   Does patient need  assistance? Functional Status Self care Ambulation Normal Comments no missed doses of rxes   Physical Exam  General:  alert and overweight-appearing.  she has gained 8 pounds. Mouth:  pharynx pink and moist, poor dentition, and teeth missing.   Lungs:  normal breath sounds.  no crackles and no wheezes.   Heart:  normal rate, regular rhythm, and no murmur.   Psych:  normally interactive, good eye contact, not anxious appearing, and not depressed appearing.          Medication Adherence: 03/29/2009   Adherence to medications reviewed with patient. Counseling to provide adequate adherence provided                                  Impression & Recommendations:  Problem # 1:  HIV INFECTION (ICD-042) I will switch to Zithromax to the suspension form and continue Diflucan and Mepron. I will check and HLA B 1507 and consider a once daily regimen of Viread, Ziagen, Reyataz Norvir. Her updated medication list for this problem includes:    Diflucan 100 Mg Tabs (Fluconazole) .Marland Kitchen... Take 1 tablet by mouth once a week    Zithromax 200 Mg/37ml Susr (Azithromycin) .Marland KitchenMarland KitchenMarland KitchenMarland Kitchen 30 ml by mouth daily    Valtrex 500 Mg Tabs (Valacyclovir hcl) .Marland Kitchen... Take 1 tablet by mouth two times a day as needed  Diagnostics Reviewed:  HIV: CDC-defined AIDS (06/20/2008)   CD4: 97 (01/24/2009)   WBC: 2.9 (11/30/2008)   Hgb: 11.5 (11/30/2008)   HCT: 36.8 (11/30/2008)   Platelets: 208 (11/30/2008) HIV genotype: See Comment (09/14/2008)   HIV-1 RNA: 21,308 (01/24/2009)   HBSAg: NEG (01/30/2009)  Orders: T-CD4SP ([facility]) (CD4SP) T-HIV Viral Load ([id number]) T-Comprehensive Metabolic Panel [id number]) T-Lipid Profile ([id number]) T-CBC w/Diff ([id number]) T-RPR (Syphilis) (63875-64332) Est. Patient Level III (99213) T-HLA-B*5701 ([id number])  Problem # 2:  HYPERTENSION (ICD-401.9) Her blood pressure is up for the first time.  Since she has so much difficulty with adherence I will not begin a new medication now.  I have talked to her about the importance of salt restriction, exercise and weight control.  She believes that she will be able to cut out some of the salt in her diet. BP today: 182/101 Prior BP: 158/81 (01/30/2009)  Labs Reviewed: K+: 3.7 (01/24/2009) Creat: : 0.69 (01/24/2009)   Chol: 199 (01/24/2009)   HDL: 51 (01/24/2009)   LDL: 133 (01/24/2009)   TG: 75 (01/24/2009)  Medications Added to Medication List This Visit: 1)  Zithromax 200 Mg/92ml Susr (Azithromycin) .... 30 ml by mouth daily  Patient Instructions: 1)  Please schedule a follow-up appointment in 1 month.  Prescriptions: ZITHROMAX 200 MG/5ML SUSR  (AZITHROMYCIN) 30 ML by mouth daily  #1 month x 11   Entered and Authorized by:   Cliffton Asters MD   Signed by:   Cliffton Asters MD on 03/29/2009   Method used:   Print then Give to Patient   RxID:   [id number]  Process Orders Check Orders Results:     [facility]: ABN not required for this insurance Tests Sent for requisitioning (March 29, 2009 4:33 PM):     03/29/2009: [facility] -- T-HIV Viral Load [phone] (signed)     03/29/2009: [facility] -- T-Comprehensive Metabolic Panel [[id number]] (signed)     03/29/2009: [facility] -- T-Lipid Profile [phone] (signed)     03/29/2009: [facility] -- [facility] w/Diff [[id number]] (signed)  03/29/2009: [facility] -- T-RPR (Syphilis) [phone] (signed)     03/29/2009: [facility] -- T-HLA-B*5701 [[id number]] (signed)

## 2010-04-12 NOTE — Miscellaneous (Signed)
Summary: HIV-1 RNA, CD4 (RESEARCH)  Clinical Lists Changes  Observations: Added new observation of CD4 COUNT: 205 microliters (01/16/2010 10:50) Added new observation of HIV1RNA QA: 46 copies/mL (01/16/2010 10:50)

## 2010-04-12 NOTE — Progress Notes (Signed)
Summary: Elevated B/Ps in the home per H. Dan Humphreys, RN  Phone Note Other Incoming   Caller: [name], RN, [facility] Summary of Call: [name] [name] has been taking the pt's blood pressure over the last several weeks when he visits.  He has found systolic values 132-160 and diastolic values not less than 119.  He wanted you to know about this.  This RN shared her last 2 OV B/Ps with him which were less than his home readings.  Jennet Maduro RN  Jul 19, 2009 1:57 PM   Follow-up for Phone Call        [name] [name]'s start her on hydrochlorthiazide 25 mg daily. Follow-up by: [name] Asters MD,  Jul 20, 2009 9:17 AM    New/Updated Medications: HYDROCHLOROTHIAZIDE 25 MG TABS (HYDROCHLOROTHIAZIDE) Take 1 tablet by mouth once a day

## 2010-04-12 NOTE — Miscellaneous (Signed)
Summary: [facility]  [facility]   Imported By: Florinda Marker 03/28/2009 14:09:41  _____________________________________________________________________  External Attachment:    Type:   Image     Comment:   External Document

## 2010-04-12 NOTE — Assessment & Plan Note (Signed)
Summary: F/U/VS   CC:  Breaking out and burning and itching above anus and at end of spine.  History of Present Illness: [name] [name] is in for her routine visit.  She has been working with a [name] on medication adherence.  She is using her pillbox consistently. She recalls missing only two doses of her HIV medications since her last visit when she got dizzy and forgot to take them in the morning.  [name] has instructed her to take the medications in the afternoon or evening whenever she misses her morning dose.  She has problems tolerating her weekly Zithromax.  It causes her to have nausea, vomiting and diarrhea.  She has a lesser degree of GI intolerance of Mepron.  She recently developed a recurrent outbreak of genital herpes on her buttocks.  She got her Valtrex refilled on April 15 but has not started taking it.  Preventive Screening-Counseling & Management  Alcohol-Tobacco     Alcohol drinks/day: 0     Smoking Status: never  Caffeine-Diet-Exercise     Caffeine use/day: yes     Does Patient Exercise: no     Type of exercise: gym     Exercise (avg: min/session): >60     Times/week: 3  Hep-HIV-STD-Contraception     HIV Risk: no risk noted  Safety-Violence-Falls     Seat Belt Use: yes  Comments: declined condoms   Prior Medication List:  VIREAD 300 MG TABS (TENOFOVIR DISOPROXIL FUMARATE) Take 1 tablet by mouth once a day ZIAGEN 300 MG TABS (ABACAVIR SULFATE) Take 2 tablets by mouth once a day REYATAZ 300 MG CAPS (ATAZANAVIR SULFATE) Take 1 capsule by mouth once a day NORVIR 100 MG TABS (RITONAVIR) Take 1 tablet by mouth once a day MEPRON 750 MG/5ML SUSP (ATOVAQUONE) 10 ml once daily with food DIFLUCAN 100 MG TABS (FLUCONAZOLE) Take 1 tablet by mouth once a week ZITHROMAX 200 MG/5ML SUSR (AZITHROMYCIN) 30 ML by mouth once weekly VALTREX 500 MG TABS (VALACYCLOVIR HCL) Take 1 tablet by mouth two times a day as needed BENADRYL 25 MG CAPS (DIPHENHYDRAMINE HCL) Take 1  capsule by mouth four times a day as needed for itching   Current Allergies (reviewed today): ! PCN ! BACTRIM Vital Signs:  Patient profile:   55 year old female Menstrual status:  postmenopausal Height:      61 inches (154.94 cm) Weight:      227.5 pounds (103.41 kg) BMI:     43.14 Temp:     98.2 degrees F (36.78 degrees C) oral Pulse rate:   92 / minute BP sitting:   138 / 84  (left arm)  Vitals Entered By: Jennet Maduro RN (July 04, 2009 9:14 AM)  Nutrition Counseling: Patient's BMI is greater than 25 and therefore counseled on weight management options. CC: Breaking out, burning and itching above anus and at end of spine Is Patient Diabetic? No Pain Assessment Patient in pain? no      Nutritional Status BMI of > 30 = obese Nutritional Status Detail appetits "good"  Have you ever been in a relationship where you felt threatened, hurt or afraid?No   Does patient need assistance? Functional Status Self care Ambulation Normal Comments missed 2 doses of hiv rxes   Physical Exam  General:  alert and overweight-appearing.   Mouth:  pharynx pink and moist, poor dentition, and teeth missing.   Lungs:  normal breath sounds.  no crackles and no wheezes.   Heart:  normal rate, regular rhythm,  and no murmur.   Skin:  she has an outbreak typical of herpes simplex on her buttocks        Medication Adherence: 07/04/2009   Adherence to medications reviewed with patient. Counseling to provide adequate adherence provided   Prevention For Positives: 07/04/2009   Safe sex practices discussed with patient. Condoms offered.                             Impression & Recommendations:  Problem # 1:  HIV INFECTION (ICD-042) Her HIV infection is coming under better control thanks to better adherence.  Even though her CD4 count is not over a hundred yet I will have her stop the Zithromax and she cannot tolerate it. The following medications were removed from the medication  list:    Zithromax 200 Mg/71ml Susr (Azithromycin) .Marland KitchenMarland KitchenMarland KitchenMarland Kitchen 30 ml by mouth once weekly Her updated medication list for this problem includes:    Diflucan 100 Mg Tabs (Fluconazole) .Marland Kitchen... Take 1 tablet by mouth once a week    Valtrex 500 Mg Tabs (Valacyclovir hcl) .Marland Kitchen... Take 1 tablet by mouth two times a day as needed  Diagnostics Reviewed:  HIV: CDC-defined AIDS (06/20/2008)   CD4: 68 (05/31/2009)   WBC: 2.3 (03/29/2009)   Hgb: 12.1 (03/29/2009)   HCT: 37.3 (03/29/2009)   Platelets: 195 (03/29/2009) HIV genotype: See Comment (09/14/2008)   HIV-1 RNA: 936 (06/16/2009)   HBSAg: NEG (01/30/2009)  Orders: Est. Patient Level III (99213)Future Orders: T-CD4SP ([facility]) (CD4SP) ... 08/15/2009 T-HIV Viral Load [phone]) ... 08/15/2009  Problem # 2:  GENITAL HERPES (ICD-054.10) I instructed her to start her Valtrex today and take it for 5 days.  I asked her to refill the prescription and have it on hand in case she has another recurrence. Orders: Est. Patient Level III (44034)  Patient Instructions: 1)  Please schedule a follow-up appointment in 2 months.

## 2010-04-12 NOTE — Progress Notes (Signed)
Summary: NCADAP/pt assist meds arrived for Mar  Phone Note Refill Request      Prescriptions: ZITHROMAX 200 MG/5ML SUSR (AZITHROMYCIN) 30 ML by mouth once weekly  #4 btls x 0   Entered by:   Paulo Fruit  BS,CPht II,MPH   Authorized by:   Cliffton Asters MD   Signed by:   Paulo Fruit  BS,CPht II,MPH on 05/09/2009   Method used:   Samples Given   RxID:   [id number] DIFLUCAN 100 MG TABS (FLUCONAZOLE) Take 1 tablet by mouth once a week  #4 x 0   Entered by:   Paulo Fruit  BS,CPht II,MPH   Authorized by:   Cliffton Asters MD   Signed by:   Paulo Fruit  BS,CPht II,MPH on 05/09/2009   Method used:   Samples Given   RxID:   [id number] MEPRON 750 MG/5ML SUSP (ATOVAQUONE) 10 ml once daily with food  #300 x 0   Entered by:   Paulo Fruit  BS,CPht II,MPH   Authorized by:   Cliffton Asters MD   Signed by:   Paulo Fruit  BS,CPht II,MPH on 05/09/2009   Method used:   Samples Given   RxID:   [id number] NORVIR 100 MG TABS (RITONAVIR) Take 1 tablet by mouth once a day  #30 x 0   Entered by:   Paulo Fruit  BS,CPht II,MPH   Authorized by:   Cliffton Asters MD   Signed by:   Paulo Fruit  BS,CPht II,MPH on 05/09/2009   Method used:   Samples Given   RxID:   [id number] REYATAZ 300 MG CAPS (ATAZANAVIR SULFATE) Take 1 capsule by mouth once a day  #30 x 0   Entered by:   Paulo Fruit  BS,CPht II,MPH   Authorized by:   Cliffton Asters MD   Signed by:   Paulo Fruit  BS,CPht II,MPH on 05/09/2009   Method used:   Samples Given   RxID:   [id number] ZIAGEN 300 MG TABS (ABACAVIR SULFATE) Take 2 tablets by mouth once a day  #60 x 0   Entered by:   Paulo Fruit  BS,CPht II,MPH   Authorized by:   Cliffton Asters MD   Signed by:   Paulo Fruit  BS,CPht II,MPH on 05/09/2009   Method used:   Samples Given   RxID:   [id number] VIREAD 300 MG TABS (TENOFOVIR DISOPROXIL FUMARATE) Take 1 tablet by mouth once a day  #30 x 0   Entered by:   Paulo Fruit  BS,CPht II,MPH   Authorized  by:   Cliffton Asters MD   Signed by:   Paulo Fruit  BS,CPht II,MPH on 05/09/2009   Method used:   Samples Given   RxID:   [id number]  Patient Assist Medication Verification: Medication name: Fluconazole 100mg  RX # [id number] Tech approval:MLD   Patient Assist Medication Verification: Medication:Ziagen 300mg  GYI#[id number] Exp Date:Aug 2013 Tech approval:MLD                Patient Assist Medication Verification: Medication: Reyataz 300mg  Lot# [id number] Exp Date:NOv 2012 Tech approval:MLD                Patient Assist Medication Verification: Medication: Norvir 100mg  GHW#[id number] Exp Date:12 Jul 2010 Tech approval:MLD                Patient Assist Medication Verification: Medication: Viread 300mg  IRC#[id number] Exp Date:07 2014 Tech approval:MLD             Patient Assist Medication Verification: Medication: Mepron  750mg /29ml Lot#[id number] Exp Date:sep 12 Tech approval:MLD                Patient Assist Medication Verification: Medication:Azithromycin 200mg /58ml Lot# [id number] Exp Date:19 Nov 12 Tech approval:MLD Call placed to patient with message that assistance medications are ready for pick-up. Paulo Fruit  BS,CPht II,MPH  May 09, 2009 12:36 PM

## 2010-04-12 NOTE — Assessment & Plan Note (Signed)
Summary: F/U/VS   CC:  follow-up visit, seen by Dr. Ambrose Mantle, GYN, placed on 1 gram dose of valacyclovir, does have a return appt.  Is having diarrhea due to increased dose.  Pt. to call Dr. Ebony Hail office about the diarrhea, and last dose that the pt. took was last Friday.  History of Present Illness: [name] [name] is in for her routine visit.  Her husband died recently but she states that she is doing okay.  She is actually found it easier to take her medications as she is not is distracted now.  She denies missing any doses of her medications but inadvertently stopped her Mepron after her last visit when I told her to stop taking Zithromax.  She stopped both.  Her only current concern is the persistent vaginal lesion and discharge.  She was seen by Dr. Volanda Napoleon and had her Valtrex increased 8 days ago.  However she says that she began to have nausea and vomiting with the higher dose of Valtrex and stopped taking it.  She says the lesion is worse.  She has a follow-up visit with Dr. Lang Snow on August 1.  She is not sexually active.  Preventive Screening-Counseling & Management  Alcohol-Tobacco     Alcohol drinks/day: 0     Smoking Status: never  Caffeine-Diet-Exercise     Caffeine use/day: yes     Does Patient Exercise: no     Type of exercise: gym     Exercise (avg: min/session): >60     Times/week: 3  Hep-HIV-STD-Contraception     HIV Risk: no risk noted  Safety-Violence-Falls     Seat Belt Use: yes  Comments: condoms declined      Sexual History:  n/a.     Updated Prior Medication List: VIREAD 300 MG TABS (TENOFOVIR DISOPROXIL FUMARATE) Take 1 tablet by mouth once a day ZIAGEN 300 MG TABS (ABACAVIR SULFATE) Take 2 tablets by mouth once a day REYATAZ 300 MG CAPS (ATAZANAVIR SULFATE) Take 1 capsule by mouth once a day NORVIR 100 MG TABS (RITONAVIR) Take 1 tablet by mouth once a day DIFLUCAN 100 MG TABS (FLUCONAZOLE) Take 1 tablet by mouth once a week VALTREX 500 MG TABS  (VALACYCLOVIR HCL) Take 1 tablet by mouth two times a day as needed BENADRYL 25 MG CAPS (DIPHENHYDRAMINE HCL) Take 1 capsule by mouth four times a day as needed for itching HYDROCHLOROTHIAZIDE 25 MG TABS (HYDROCHLOROTHIAZIDE) Take 1 tablet by mouth once a day  Current Allergies (reviewed today): ! PCN ! BACTRIM Social History: Sexual History:  n/a  Vital Signs:  Patient profile:   55 year old female Menstrual status:  postmenopausal Height:      61 inches (154.94 cm) Weight:      227 pounds (103.18 kg) BMI:     43.05 Temp:     97.7 degrees F oral Pulse rate:   87 / minute BP sitting:   139 / 86  (left arm) Cuff size:   large  Vitals Entered By: Jennet Maduro RN (October 05, 2009 9:31 AM) CC: follow-up visit, seen by Dr. Ambrose Mantle, GYN, placed on 1 gram dose of valacyclovir, does have a return appt.  Is having diarrhea due to increased dose.  Pt. to call Dr. Ebony Hail office about the diarrhea, last dose that the pt. took was last Friday Is Patient Diabetic? No Pain Assessment Patient in pain? yes     Location: pelvic pain Intensity: 6 Type: soreness, Onset of pain  Constant Nutritional Status BMI of >  30 = obese Nutritional Status Detail appetite "good"  Have you ever been in a relationship where you felt threatened, hurt or afraid?No   Does patient need assistance? Functional Status Self care Ambulation Normal Comments no missed doses   Physical Exam  General:  alert and overweight-appearing.   Mouth:  pharynx pink and moist.   Lungs:  normal breath sounds.  no crackles and no wheezes.   Heart:  normal rate, regular rhythm, and no murmur.   Psych:  normally interactive, good eye contact, not anxious appearing, and not depressed appearing.          Medication Adherence: 10/05/2009   Adherence to medications reviewed with patient. Counseling to provide adequate adherence provided   Prevention For Positives: 10/05/2009   Safe sex practices discussed with patient.  Condoms offered.                             Impression & Recommendations:  Problem # 1:  HIV INFECTION (ICD-042) Her adherence is better and her HIV infection is coming under better control.  I will not make any changes in her regimen today. Her updated medication list for this problem includes:    Diflucan 100 Mg Tabs (Fluconazole) .Marland Kitchen... Take 1 tablet by mouth once a week    Valtrex 500 Mg Tabs (Valacyclovir hcl) .Marland Kitchen... 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 Patient Level III (99213)Future Orders: T-CD4SP ([facility]) (CD4SP) ... 01/03/2010 T-HIV Viral Load [phone]) ... 01/03/2010  Problem # 2:  LESION, VAGINA (ICD-236.3) I would suggest that she have viral cultures and a biopsy if the lesion is not improving by the time she sees Dr. Ambrose Mantle on August . Orders: Est. Patient Level III (09811)  Problem # 3:  HYPERTENSION (ICD-401.9) I will continue her current regimen. Her updated medication list for this problem includes:    Hydrochlorothiazide 25 Mg Tabs (Hydrochlorothiazide) .Marland Kitchen... Take 1 tablet by mouth once a day  Orders: Est. Patient Level III (91478)  BP today: 139/86 Prior BP: 133/86 (09/07/2009)  Labs Reviewed: K+: 3.7 (09/07/2009) Creat: : 0.69 (09/07/2009)   Chol: 174 (09/07/2009)   HDL: 43 (09/07/2009)   LDL: 109 (09/07/2009)   TG: 110 (09/07/2009)  Medications Added to Medication List This Visit: 1)  Mepron 750 Mg/66ml Susp (Atovaquone) .Marland Kitchen.. 10 ml by mouth daily  Patient Instructions: 1)  Please schedule a follow-up appointment in 3 months.         Medication Adherence: 10/05/2009   Adherence to medications reviewed with patient. Counseling to  provide adequate adherence provided    Prevention For Positives: 10/05/2009   Safe sex practices discussed with patient. Condoms offered.

## 2010-04-12 NOTE — Progress Notes (Signed)
Summary: refill/mld  Phone Note From Pharmacy   Caller: [facility] Reason for Call: Needs renewal Summary of Call: Pharmacy called for a renewal prescription for patient's Mepron.  Please send to [facility]. Initial call taken by: Paulo Fruit  BS,CPht II,MPH,  October 11, 2009 12:10 PM    Prescriptions: MEPRON 750 MG/5ML SUSP (ATOVAQUONE) 10 ml by mouth daily  #300 x 6   Entered by:   Paulo Fruit  BS,CPht II,MPH   Authorized by:   Cliffton Asters MD   Signed by:   Paulo Fruit  BS,CPht II,MPH on 10/11/2009   Method used:   Electronically to        [facility] [phone]* (retail)       [address]       [place], [place]  [address]       Ph: [phone]       Fax:    RxID:   [id number]  Paulo Fruit  BS,CPht II,MPH  October 11, 2009 12:10 PM

## 2010-04-12 NOTE — Miscellaneous (Signed)
Summary: Problem list correction  Clinical Lists Changes  Problems: Removed problem of PAPANICOLAOU SMEAR OF ANUS WITH LGSIL (ICD-796.73) Added new problem of PAPANICOLAOU SMEAR OF VAGINA WITH LGSIL (ICD-795.13)

## 2010-04-12 NOTE — Miscellaneous (Signed)
Summary: clinical update/ryan white NCADAP apprv til 06/09/10  Clinical Lists Changes  Observations: Added new observation of AIDSDAP: Yes 2011 (06/19/2009 11:50)

## 2010-04-12 NOTE — Miscellaneous (Signed)
Summary: RW Flowsheet update  Clinical Lists Changes  Observations: Added new observation of MARITAL STAT: Widowed (10/27/2009 11:26)

## 2010-04-12 NOTE — Assessment & Plan Note (Signed)
Summary: STUDY APPT/ LH    Current Allergies: ! PCN ! BACTRIM Vital Signs:  Patient profile:   55 year old female Menstrual status:  postmenopausal Weight:      228 pounds (103.64 kg) BMI:     43.24 Temp:     98.1 degrees F oral Pulse rate:   85 / minute BP sitting:   133 / 86  (right arm) Is Patient Diabetic? No Pain Assessment Patient in pain? no      Nutritional Status BMI of > 30 = obese  Does patient need assistance? Functional Status Self care Ambulation Normal   Patient here for week 224 ALLRT study visit. She has a sore area on her vagina that has been bothering her since her herpes outbreak cleared up. I have instructed her to followup at [facility]. She also has an itchy rash on her face that has started recently. Her husband passed away 2 weeks ago and she will be seeing [place] tomorrow for counseling. She has a followup appt with Dr. Orvan Falconer this coming month.Deirdre Evener RN  September 07, 2009 10:54 AM   Other Orders: Est. Patient Research Study [id number]) T-CBC w/Diff [phone]) T-Comprehensive Metabolic Panel [id number]) T-Lipid Profile [phone])  Process Orders Check Orders Results:     [facility]: ABN not required for this insurance Tests Sent for requisitioning (September 07, 2009 10:47 AM):     09/07/2009: [facility] -- T-CBC w/Diff [[id number]] (signed)     09/07/2009: [facility] -- T-Comprehensive Metabolic Panel [[id number]] (signed)     09/07/2009: [facility] -- T-Lipid Profile [phone] (signed)

## 2010-04-12 NOTE — Progress Notes (Signed)
Summary: F/U BMP in 2 weeks  ---- Converted from flag ---- ---- 01/18/2010 1:17 PM, Cliffton Asters MD wrote: Please ask Laressa to eat a banana daily and return in 2 weeks for a BMP to recheck her potassium. ------------------------------

## 2010-04-12 NOTE — Letter (Signed)
Summary: Out of [facility] for Infectious Disease  [address]   [place], [place] [address]   Phone: [phone]  Fax: [phone]    September 05, 2009   Student:  Fuller Song    To Whom It May Concern:   For Medical reasons, please excuse the above named student from school for the following dates:  Start:   August 27, 2009  End:    September 25, 2009  If you need additional information, please feel free to contact our office.   Sincerely,    Cliffton Asters MD    ****This is a legal document and cannot be tampered with.  Schools are authorized to verify all information and to do so accordingly.

## 2010-04-12 NOTE — Miscellaneous (Signed)
  Clinical Lists Changes  Observations: Added new observation of YEARAIDSPOS: 2007  (01/23/2010 16:13)

## 2010-04-12 NOTE — Progress Notes (Signed)
Summary: B/P in home 170/100  Phone Note Other Incoming   Caller: Cleophus Molt, RN Summary of Call: Visited pt. today.  B/P 170/100 today.  Wanted Dr. Orvan Falconer to know about B/P prior to OV tomorrow. Jennet Maduro RN  March 28, 2009 2:15 PM

## 2010-04-12 NOTE — Progress Notes (Signed)
Summary: NCADAP/pt assist med arrived for Jan  Phone Note Refill Request      Prescriptions: ZITHROMAX 200 MG/5ML SUSR (AZITHROMYCIN) 30 ML by mouth once weekly  #4 btls x 0   Entered by:   Paulo Fruit  BS,CPht II,MPH   Authorized by:   Cliffton Asters MD   Signed by:   Paulo Fruit  BS,CPht II,MPH on 03/31/2009   Method used:   Samples Given   RxID:   [id number]   Patient Assist Medication Verification: Medication: Azithromycin 200mg /44ml w/distilled water to mix Lot# [id number] Exp Date:Nov 12 Tech approval:MLD Patient picked up Paulo Fruit  BS,CPht II,MPH  March 31, 2009 4:34 PM

## 2010-04-12 NOTE — Assessment & Plan Note (Signed)
Summary: herpes outbreak   History of Present Illness: Pt has had an outbreak of herpes for the last several days. She is out of her valtrex and needs more.   Updated Prior Medication List: VIREAD 300 MG TABS (TENOFOVIR DISOPROXIL FUMARATE) Take 1 tablet by mouth once a day ZIAGEN 300 MG TABS (ABACAVIR SULFATE) Take 2 tablets by mouth once a day REYATAZ 300 MG CAPS (ATAZANAVIR SULFATE) Take 1 capsule by mouth once a day NORVIR 100 MG TABS (RITONAVIR) Take 1 tablet by mouth once a day MEPRON 750 MG/5ML SUSP (ATOVAQUONE) 10 ml once daily with food DIFLUCAN 100 MG TABS (FLUCONAZOLE) Take 1 tablet by mouth once a week VALTREX 500 MG TABS (VALACYCLOVIR HCL) Take 1 tablet by mouth two times a day as needed BENADRYL 25 MG CAPS (DIPHENHYDRAMINE HCL) Take 1 capsule by mouth four times a day as needed for itching HYDROCHLOROTHIAZIDE 25 MG TABS (HYDROCHLOROTHIAZIDE) Take 1 tablet by mouth once a day  Current Allergies: ! PCN ! 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    Physical Exam  General:  alert, well-developed, well-nourished, and well-hydrated.   Head:  normocephalic and atraumatic.   Mouth:  pharynx pink and moist.   Lungs:  normal breath sounds.   Skin:  healing ulcers on left buttock   Impression & Recommendations:  Problem # 1:  GENITAL HERPES (ICD-054.10)  apparently vlatrex was already called in pt to start on it as soon as shee  gets it.  Orders: Est. Patient Level II (04540)  Appended Document: herpes outbreak B/P 156/92 Temp. 98.2 (oral) Pulse 85 weight 227.12 lbs. Height 61  Marylen P[place], [place]

## 2010-04-12 NOTE — Progress Notes (Signed)
Summary: Pt assist meds arrived via [facility] mail order for Jan  Phone Note Refill Request      Prescriptions: ZITHROMAX 600 MG TABS (AZITHROMYCIN) Take 2 tablets by mouth once a week  #8 x 0   Entered by:   Paulo Fruit  BS,CPht II,MPH   Authorized by:   Cliffton Asters MD   Signed by:   Paulo Fruit  BS,CPht II,MPH on 03/15/2009   Method used:   Samples Given   RxID:   [id number] DIFLUCAN 100 MG TABS (FLUCONAZOLE) Take 1 tablet by mouth once a week  #4 x 0   Entered by:   Paulo Fruit  BS,CPht II,MPH   Authorized by:   Cliffton Asters MD   Signed by:   Paulo Fruit  BS,CPht II,MPH on 03/15/2009   Method used:   Samples Given   RxID:   [id number] MEPRON 750 MG/5ML SUSP (ATOVAQUONE) 10 ml once daily with food  #300 x 0   Entered by:   Paulo Fruit  BS,CPht II,MPH   Authorized by:   Cliffton Asters MD   Signed by:   Paulo Fruit  BS,CPht II,MPH on 03/15/2009   Method used:   Samples Given   RxID:   [id number] NORVIR 100 MG TABS (RITONAVIR) Take 1 tablet by mouth once a day  #30 x 0   Entered by:   Paulo Fruit  BS,CPht II,MPH   Authorized by:   Cliffton Asters MD   Signed by:   Paulo Fruit  BS,CPht II,MPH on 03/15/2009   Method used:   Samples Given   RxID:   [id number] REYATAZ 300 MG CAPS (ATAZANAVIR SULFATE) Take 1 capsule by mouth once a day  #30 x 0   Entered by:   Paulo Fruit  BS,CPht II,MPH   Authorized by:   Cliffton Asters MD   Signed by:   Paulo Fruit  BS,CPht II,MPH on 03/15/2009   Method used:   Samples Given   RxID:   [id number] RETROVIR 300 MG TABS (ZIDOVUDINE) Take 1 tablet by mouth twice a day  #60 x 0   Entered by:   Paulo Fruit  BS,CPht II,MPH   Authorized by:   Cliffton Asters MD   Signed by:   Paulo Fruit  BS,CPht II,MPH on 03/15/2009   Method used:   Samples Given   RxID:   [id number] TRUVADA 200-300 MG TABS (EMTRICITABINE-TENOFOVIR) Take 1 tablet by mouth once a day  #30 x 0   Entered by:   Paulo Fruit  BS,CPht  II,MPH   Authorized by:   Cliffton Asters MD   Signed by:   Paulo Fruit  BS,CPht II,MPH on 03/15/2009   Method used:   Samples Given   RxID:   [id number]  Patient Assist Medication Verification: Medication name: Azithromycin 600mg  RX # [id number] Tech approval:MLD  Patient Assist Medication Verification: Medication name: Fluconazole 100mg  RX # [id number] Tech approval:MLD   Patient Assist Medication Verification: Medication:Mepron 750mg /61ml Lot# [id number] Exp Date:Aug 12 Tech approval:MLD                Patient Assist Medication Verification: Medication:Reyataz 300mg  [id number] Exp Date:Sep 2012 Tech approval:MLD                Patient Assist Medication Verification: Medication: Norvir 100mg  NIO#[id number] Exp Date:11 May 2010 Tech approval:MLD                Patient Assist Medication Verification: Medication:Zidovudine 300mg  KXF#[id number] Exp Date:apr 12 Tech approval:MLD  Patient Assist Medication Verification: Medication:Truvada EAV#[id number] Exp Date:05 2014 Tech approval:MLD Call placed to patient with message that assistance medications are ready for pick-up. Paulo Fruit  BS,CPht II,MPH  March 15, 2009 10:50 AM

## 2010-04-12 NOTE — Assessment & Plan Note (Signed)
Summary: STUDY APPT/ LH   History of Present Illness: [name] [name] is in for her routine visit.  She recalls missing only one dose of her HIV medications since her last visit when she got distracted caring for her mother.  She generally takes her HIV medicines each morning but occasionally forgets and takes it in the afternoon.  She has a pillbox but does not have any other system up reminders to take her medications at the same time each day.  Her vaginal lesion has now healed and she is taking acyclovir 400 mg 3 times daily in an attempt to prevent recurrence.  She has not been sexually active since her husband died.  She recently stooped over to pick up some laundry and developed sudden the left low back pain radiating down the back of her left leg.  She has been taking Aleve but still has some discomfort.   Current Allergies: ! PCN ! BACTRIM Physical Exam  General:  alert and overweight-appearing.  alert.   Mouth:  pharynx pink and moist.  poor dentition.  poor dentition.   Lungs:  normal breath sounds.  no crackles and no wheezes.   Heart:  normal rate, regular rhythm, and no murmur.   Abdomen:  soft, non-tender, normal bowel sounds, and no masses.  obese.soft, non-tender, normal bowel sounds, and no masses.   Neurologic:  strength normal in all extremities, sensation intact to light touch, gait normal, and DTRs symmetrical and normal.  strength normal in all extremities, sensation intact to light touch, gait normal, and DTRs symmetrical and normal.   Skin:  no rashes.  no rashes.   Psych:  normally interactive, good eye contact, not anxious appearing, and not depressed appearing.     Impression & Recommendations:  Problem # 1:  HIV INFECTION (ICD-042)  Her updated medication list for this problem includes:    Diflucan 100 Mg Tabs (Fluconazole) .Marland Kitchen... Take 1 tablet by mouth once a week    Acyclovir 400 Mg Tabs (Acyclovir) .Marland Kitchen... Take 1 tablet by mouth three times a day  Orders: Est.  Patient Research Study [phone]) T-Comprehensive Metabolic Panel [id number]) T-Lipid Profile [phone]) Est. Patient Level IV (13086) T-Urine Protein [phone]) T-Urine Creatinine ([id number])  Diagnostics Reviewed:  HIV: CDC-defined AIDS (06/20/2008)   CD4: 130 (09/08/2009)   WBC: 3.7 (09/07/2009)   Hgb: 11.8 (09/07/2009)   HCT: 36.6 (09/07/2009)   Platelets: 227 (09/07/2009) HIV genotype: See Comment (09/14/2008)   HIV-1 RNA: 93 (09/07/2009)   HBSAg: NEG (01/30/2009)  Her updated medication list for this problem includes:    Diflucan 100 Mg Tabs (Fluconazole) .Marland Kitchen... Take 1 tablet by mouth once a week    Acyclovir 400 Mg Tabs (Acyclovir) .Marland Kitchen... Take 1 tablet by mouth three times a day  Problem # 2:  LESION, VAGINA (ICD-236.3) Her genital herpes is responding slowly to acyclovir therapy and immune reconstitution. Orders: Est. Patient Level IV (02725)  Problem # 3:  LOW BACK PAIN, ACUTE (ICD-724.2)  Her acute low back pain is probably due to musculoskeletal strain.  I have reviewed stretching exercises that she can begin doing in an attempt to recover more quickly.  I have asked her to continue taking Aleve up to two times daily along with heat therapy. Her updated medication list for this problem includes:    Aleve 220 Mg Tabs (Naproxen sodium) .Marland Kitchen... Take 1 tablet by mouth two times a day  Orders: Est. Patient Level IV (36644)  Her updated medication list for this problem includes:  Aleve 220 Mg Tabs (Naproxen sodium) .Marland Kitchen... Take 1 tablet by mouth two times a day  Problem # 4:  HYPERTENSION (ICD-401.9)  I will continue her current medication. Her updated medication list for this problem includes:    Hydrochlorothiazide 25 Mg Tabs (Hydrochlorothiazide) .Marland Kitchen... Take 1 tablet by mouth once a day  Orders: Est. Patient Level IV (24401)  Her updated medication list for this problem includes:    Hydrochlorothiazide 25 Mg Tabs (Hydrochlorothiazide) .Marland Kitchen... Take 1 tablet by mouth  once a day  Medications Added to Medication List This Visit: 1)  Acyclovir 400 Mg Tabs (Acyclovir) .... Take 1 tablet by mouth three times a day 2)  Aleve 220 Mg Tabs (Naproxen sodium) .... Take 1 tablet by mouth two times a day     Patient Instructions: 1)  Please schedule a follow-up appointment in 3 months.

## 2010-04-12 NOTE — Miscellaneous (Signed)
Summary: f/u BMP   Clinical Lists Changes  Orders: Added new Test order of T-Basic Metabolic Panel [id number]) - Signed     Process Orders Check Orders Results:     [facility]: ABN not required for this insurance Order queued for requisitioning for [facility]: January 22, 2010 4:50 PM  Tests Sent for requisitioning (January 22, 2010 4:50 PM):     02/07/2010: [facility] -- T-Basic Metabolic Panel [phone] (signed)

## 2010-04-12 NOTE — Miscellaneous (Signed)
Summary: HCP Continous Care: Home Health Cert. & Plan Of Care  HCP Continous Care: Home Health Cert. & Plan Of Care   Imported By: Florinda Marker 09/07/2009 14:48:40  _____________________________________________________________________  External Attachment:    Type:   Image     Comment:   External Document

## 2010-04-12 NOTE — Miscellaneous (Signed)
Summary: Scrrening Mammogram   Clinical Lists Changes Pt. given Mammogram scholarship paperwork for [facility] Program Problems: Added new problem of PREVENTIVE HEALTH CARE (ICD-V70.0) Orders: Added new Test order of Mammogram (Screening) (Mammo) - Signed  Appended Document: Scrrening Mammogram  faxed to [facility]

## 2010-04-12 NOTE — Miscellaneous (Signed)
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

## 2010-04-12 NOTE — Progress Notes (Signed)
Summary: Referal to Dr. Ebony Hail office for GYN problems  Phone Note Call from Patient Call back at [facility] Phone [phone]   Caller: Patient Reason for Call: Acute Illness Summary of Call: Continuing problems with vaginal/labial lesions.  To restart Valtrex as soon as ADAP delivers to her.  Pt. unable to be seen by  [facility] until Sept. 2011.  Pt. desires to be seen earlier.  Very concerned about drainage from the lesions.  Needing to change her panties at least 3 times a day due to the drainage.  Pt. contacted Dr. Erick Colace office because the pt. has been approved for Medicaid as of July 1st.  Dr. Ebony Hail office will see the pt. if this office will refer her.  Pt. requesting referral to Dr. Ebony Hail office.  Obtained verbal order from Dr. Orvan Falconer to refer pt. Phone call to pt. to let her know that the referral will be made.  RN will call pt. after speaking w/ Dr. Ebony Hail office. Jennet Maduro RN  September 19, 2009 11:25 AM  Phone call to pt.  Pt. given appt. information.  Pt. verbalized understanding. Jennet Maduro RN  September 21, 2009 8:40 AM

## 2010-04-12 NOTE — Miscellaneous (Signed)
Summary: Orders Update  Clinical Lists Changes  Orders: Added new Test order of T-HIV Viral Load [phone]) - Signed Added new Test order of T-Comprehensive Metabolic Panel [phone]) - Signed Added new Test order of T-RPR (Syphilis) ([id number]) - Signed Added new Test order of T-CD4SP [facility]) (CD4SP) - Signed Added new Test order of T-CBC w/Diff [phone]) - Signed

## 2010-04-12 NOTE — Progress Notes (Signed)
  Phone Note Call from Patient   Caller: Patient Reason for Call: Talk to Nurse Summary of Call: Patient called and said that she was seen at the GYN clinic today and the MD there said he thought she probably needed to be hospitalized for iv valcyclovir. She continues to feel weak, tired and have pelvic pain as well as lesions that are draining on her perineum. She said that Dr. Lang Snow had tried to contact Dr. Orvan Falconer about it. She is now leaving the office and can be contacted at home. I told her we would call her back when we could find out something. 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 Ambrose Mantle). We will call her and try to improved the tolerability and adherence.If she does not improve with adequate therapy for HSV she will need a Bx.

## 2010-04-12 NOTE — Progress Notes (Signed)
Summary: [place]/pt assist meds arrived for Apr  Phone Note Refill Request      Prescriptions: VALTREX 500 MG TABS (VALACYCLOVIR HCL) Take 1 tablet by mouth two times a day as needed  #10 x 0   Entered by:   Paulo Fruit  BS,CPht II,MPH   Authorized by:   Cliffton Asters MD   Signed by:   Paulo Fruit  BS,CPht II,MPH on 06/27/2009   Method used:   Samples Given   RxID:   [id number] ZITHROMAX 200 MG/5ML SUSR (AZITHROMYCIN) 30 ML by mouth once weekly  #4 btls x 0   Entered by:   Paulo Fruit  BS,CPht II,MPH   Authorized by:   Cliffton Asters MD   Signed by:   Paulo Fruit  BS,CPht II,MPH on 06/27/2009   Method used:   Samples Given   RxID:   [id number] DIFLUCAN 100 MG TABS (FLUCONAZOLE) Take 1 tablet by mouth once a week  #4 x 0   Entered by:   Paulo Fruit  BS,CPht II,MPH   Authorized by:   Cliffton Asters MD   Signed by:   Paulo Fruit  BS,CPht II,MPH on 06/27/2009   Method used:   Samples Given   RxID:   [id number] MEPRON 750 MG/5ML SUSP (ATOVAQUONE) 10 ml once daily with food  #300 x 0   Entered by:   Paulo Fruit  BS,CPht II,MPH   Authorized by:   Cliffton Asters MD   Signed by:   Paulo Fruit  BS,CPht II,MPH on 06/27/2009   Method used:   Samples Given   RxID:   [id number] NORVIR 100 MG TABS (RITONAVIR) Take 1 tablet by mouth once a day  #30 x 0   Entered by:   Paulo Fruit  BS,CPht II,MPH   Authorized by:   Cliffton Asters MD   Signed by:   Paulo Fruit  BS,CPht II,MPH on 06/27/2009   Method used:   Samples Given   RxID:   [id number] REYATAZ 300 MG CAPS (ATAZANAVIR SULFATE) Take 1 capsule by mouth once a day  #30 x 0   Entered by:   Paulo Fruit  BS,CPht II,MPH   Authorized by:   Cliffton Asters MD   Signed by:   Paulo Fruit  BS,CPht II,MPH on 06/27/2009   Method used:   Samples Given   RxID:   [id number] ZIAGEN 300 MG TABS (ABACAVIR SULFATE) Take 2 tablets by mouth once a day  #60 x 0   Entered by:   Paulo Fruit  BS,CPht II,MPH  Authorized by:   Cliffton Asters MD   Signed by:   Paulo Fruit  BS,CPht II,MPH on 06/27/2009   Method used:   Samples Given   RxID:   [id number] VIREAD 300 MG TABS (TENOFOVIR DISOPROXIL FUMARATE) Take 1 tablet by mouth once a day  #30 x 0   Entered by:   Paulo Fruit  BS,CPht II,MPH   Authorized by:   Cliffton Asters MD   Signed by:   Paulo Fruit  BS,CPht II,MPH on 06/27/2009   Method used:   Samples Given   RxID:   [id number]  Patient Assist Medication Verification: Medication name: Valacyclovir 500mg  RX # [id number] Tech approval:MLD  Patient Assist Medication Verification: Medication name:Fluconazole 100mg  RX # [id number] Tech approval:MLD   Patient Assist Medication Verification: Medication:Norvir 100mg  Lot# [id number] Exp Date:10 Nov 2010 Tech approval:MLD                Patient Assist Medication Verification: Medication:Viread 300mg  Lot# [id number]  Exp Date:07 2014 Tech approval:MLD                Patient Assist Medication Verification: Medication:Reyataz 300mg  Lot# [id number] Exp Date:Jan 2013 Tech approval:MLD                Patient Assist Medication Verification: Medication:Ziagen 300mg  Lot# [id number] Exp Date:Aug 2013 Tech approval:MLD                Patient Assist Medication Verification: Medication:Mepron 750mg /83ml suspension Lot# [id number] Exp Date:Jan 2013 Tech approval:MLD               Patient Assist Medication Verification: Medication:Azithromycin 200mg /34ml Lot# [id number] Exp Date:Apr 13 Tech approval:MLD Call placed to patient with message that assistance medications are ready for pick-up. Paulo Fruit  BS,CPht II,MPH  June 27, 2009 12:10 PM                 Patient Assist Medication Verification: Medication: Lot# Exp Date: Tech approval:

## 2010-04-12 NOTE — Assessment & Plan Note (Signed)
Summary: STUDY APPT/ LH    Current Allergies: ! PCN ! BACTRIM Vital Signs:  Patient profile:   55 year old female Menstrual status:  postmenopausal Weight:      225.5 pounds (102.50 kg) BMI:     42.76 Temp:     97.7 degrees F oral Pulse rate:   93 / minute BP sitting:   145 / 90  (right arm) Is Patient Diabetic? No Pain Assessment Patient in pain? no      Nutritional Status BMI of > 30 = obese  Does patient need assistance? Functional Status Self care Ambulation Normal   Patient here for week 192 ALLRT study visit. She complains of fatigue and has a itchy rash on her rectum which she thinks is herpes. She started back on her ARVs 2 weeks ago and promises to continue them. She has been on the mepron, diflucan and zithromax longer than that, but says that the zithromax makes her nauseated. Her left hand/arm occassionally ache, but that has been chronic. She is to see Dr. Orvan Falconer in 4 weeks and has a lab visit in 2 weeks.Deirdre Evener RN  May 31, 2009 1:27 PM    Other Orders: Est. Patient Research Study [phone]) T-Comprehensive Metabolic Panel [phone]) T-Lipid Profile [phone]) Prescriptions: VALTREX 500 MG TABS (VALACYCLOVIR HCL) Take 1 tablet by mouth two times a day as needed  #10 x 11   Entered by:   Deirdre Evener RN   Authorized by:   Cliffton Asters MD   Signed by:   Deirdre Evener RN on 05/31/2009   Method used:   Print then Give to Patient   RxID:   [id number]  Process Orders Check Orders Results:     [facility]: ABN not required for this insurance Tests Sent for requisitioning (May 31, 2009 1:22 PM):     05/31/2009: [facility] -- T-Comprehensive Metabolic Panel [[id number]] (signed)     05/31/2009: [facility] -- T-Lipid Profile [phone] (signed)   Spoke to Barbaraann Cao at [facility] for [place]. Patient will receive med tomorrow at the clinic. Paulo Fruit  BS,CPht II,MPH  May 31, 2009 10:55  AM

## 2010-04-12 NOTE — Assessment & Plan Note (Signed)
Summary: Client enrolled in THP services.  CM met with client to enroll in Case Management services. Seeking help with independent housing and community resources.     Document signed by [employer] team due to date of document.  Original dictated document resides in [place].

## 2010-04-12 NOTE — Progress Notes (Signed)
Summary: Drug dose clarification needed  Phone Note Outgoing Call   Call placed by: Paulo Fruit  BS,CPht II,MPH,  March 30, 2009 10:15 AM Call placed to: [facility] Details for Reason: d/c'ing old medications. Calling in new med Summary of Call: In calling in the new prescription Dr. Orvan Falconer wants patient to take, the pharmacist needed a little more clarification on the dosing of the Zithromycin 200mg /5 ml suspension.  The way it is written is an extremely high dosing.  Usually it is not taken daily.  Pharmacist wants to know if you do want patient to take this medication once daily since its not usually dosed that way, then they also need a reason behind why so it can be documented. Initial call taken by: Paulo Fruit  BS,CPht II,MPH,  March 30, 2009 10:24 AM  Follow-up for Phone Call        I meant for it to read Zithromax 30 ML by mouth once weekly. Thanks. Follow-up by: Cliffton Asters MD,  March 30, 2009 11:59 AM    New/Updated Medications: ZITHROMAX 200 MG/5ML SUSR (AZITHROMYCIN) 30 ML by mouth once weekly  Appended Document: Drug dose clarification needed Thanks. Pharmacy notified. Spoke to [place], [place] [phone] XT [phone]

## 2010-04-12 NOTE — Assessment & Plan Note (Signed)
Summary: LAB APPT FOR [name] [name]/ LH    Current Allergies: ! PCN ! BACTRIM

## 2010-04-12 NOTE — Miscellaneous (Addendum)
Summary: HCP: Home Health Cert. & Plan Of Care  HCP: Home Health Cert. & Plan Of Care   Imported By: Florinda Marker 02/27/2010 09:06:39  _____________________________________________________________________  External Attachment:    Type:   Image     Comment:   External Document

## 2010-04-12 NOTE — Miscellaneous (Signed)
Summary: clinical update/ryan white [place]ADAP completed  Clinical Lists Changes  Observations: Added new observation of PCTFPL: 155.42  (04/25/2009 15:14) Added new observation of INCOMESOURCE: DIS   (04/25/2009 15:14) Added new observation of HOUSEINCOME: 16109  (04/25/2009 15:14) Added new observation of FINASSESSDT: 04/03/2009  (04/25/2009 15:14)

## 2010-04-12 NOTE — Assessment & Plan Note (Signed)
Summary: F/U OV/VS   CC:  f/u ov , Depression 3. body aches , and lower body pain "sharp shooting with movement' . The patient denies that she feels like life is not worth living, denies that she wishes that she were dead, and denies that she has thought about ending her life.        Preventive Screening-Counseling & Management  Alcohol-Tobacco     Alcohol drinks/day: 0     Smoking Status: never  Hep-HIV-STD-Contraception     HIV Risk: risk noted  Safety-Violence-Falls     Seat Belt Use: yes      Sexual History:  not sexually active.     Current Allergies (reviewed today): ! PCN ! BACTRIM Social History: Sexual History:  not sexually active  Vital Signs:  Patient profile:   55 year old female Menstrual status:  postmenopausal Height:      61 inches Weight:      229 pounds BMI:     43.43 Temp:     98.1 degrees F oral Pulse rate:   84 / minute BP sitting:   134 / 85  (right arm)  Vitals Entered By: Tomasita Morrow RN (January 16, 2010 8:50 AM) CC: f/u ov , Depression 3. body aches , lower body pain "sharp shooting with movement'  Pain Assessment Patient in pain? yes     Location: whole body  Intensity: 10 Type: aching Onset of pain  x 1 month  Nutritional Status BMI of > 30 = obese  Have you ever been in a relationship where you felt threatened, hurt or afraid?No  Domestic Violence Intervention none  Does patient need assistance? Functional Status Self care Ambulation Normal

## 2010-04-12 NOTE — Progress Notes (Signed)
Summary: [place]ADAP/pt assist meds arrived for Mar  Phone Note Refill Request      Prescriptions: VALTREX 500 MG TABS (VALACYCLOVIR HCL) Take 1 tablet by mouth two times a day as needed  #10 x 0   Entered by:   Paulo Fruit  BS,CPht II,MPH   Authorized by:   Cliffton Asters MD   Signed by:   Paulo Fruit  BS,CPht II,MPH on 06/01/2009   Method used:   Samples Given   RxID:   [id number] ZITHROMAX 200 MG/5ML SUSR (AZITHROMYCIN) 30 ML by mouth once weekly  #4 btls x 0   Entered by:   Paulo Fruit  BS,CPht II,MPH   Authorized by:   Cliffton Asters MD   Signed by:   Paulo Fruit  BS,CPht II,MPH on 06/01/2009   Method used:   Samples Given   RxID:   [id number] DIFLUCAN 100 MG TABS (FLUCONAZOLE) Take 1 tablet by mouth once a week  #4 x 0   Entered by:   Paulo Fruit  BS,CPht II,MPH   Authorized by:   Cliffton Asters MD   Signed by:   Paulo Fruit  BS,CPht II,MPH on 06/01/2009   Method used:   Samples Given   RxID:   [id number] MEPRON 750 MG/5ML SUSP (ATOVAQUONE) 10 ml once daily with food  #300 x 0   Entered by:   Paulo Fruit  BS,CPht II,MPH   Authorized by:   Cliffton Asters MD   Signed by:   Paulo Fruit  BS,CPht II,MPH on 06/01/2009   Method used:   Samples Given   RxID:   [id number] NORVIR 100 MG TABS (RITONAVIR) Take 1 tablet by mouth once a day  #30 x 0   Entered by:   Paulo Fruit  BS,CPht II,MPH   Authorized by:   Cliffton Asters MD   Signed by:   Paulo Fruit  BS,CPht II,MPH on 06/01/2009   Method used:   Samples Given   RxID:   [id number] REYATAZ 300 MG CAPS (ATAZANAVIR SULFATE) Take 1 capsule by mouth once a day  #30 x 0   Entered by:   Paulo Fruit  BS,CPht II,MPH   Authorized by:   Cliffton Asters MD   Signed by:   Paulo Fruit  BS,CPht II,MPH on 06/01/2009   Method used:   Samples Given   RxID:   [id number] ZIAGEN 300 MG TABS (ABACAVIR SULFATE) Take 2 tablets by mouth once a day  #60 x 0   Entered by:   Paulo Fruit  BS,CPht II,MPH  Authorized by:   Cliffton Asters MD   Signed by:   Paulo Fruit  BS,CPht II,MPH on 06/01/2009   Method used:   Samples Given   RxID:   [id number] VIREAD 300 MG TABS (TENOFOVIR DISOPROXIL FUMARATE) Take 1 tablet by mouth once a day  #30 x 0   Entered by:   Paulo Fruit  BS,CPht II,MPH   Authorized by:   Cliffton Asters MD   Signed by:   Paulo Fruit  BS,CPht II,MPH on 06/01/2009   Method used:   Samples Given   RxID:   [id number]  Patient Assist Medication Verification: Medication name: Fluconazole 100mg  RX # [id number] Tech approval:MLD  Patient Assist Medication Verification: Medication name: Valacyclovir 500mg  RX # [id number] Tech approval:MLD   Patient Assist Medication Verification: Medication: Ziagen 300mg  IOE#[id number] Exp Date:Aug 2013 Tech approval:MLD                Patient Assist Medication Verification: Medication: Reyataz 300mg  Lot#[id number]  Exp Date:Dec 2012 Tech approval:MLD                Patient Assist Medication Verification: Medication:Viread 300mg  ZOX#[id number] Exp Date:07 2014 Tech approval:MLD                Patient Assist Medication Verification: Medication:Norvir 100mg  JWJ#[id number] Exp Date:14 Jul 2010 Tech approval:MLD                Patient Assist Medication Verification: Medication:Azithromycin 200mg /45ml Lot# [id number] Exp Date:NOv 12 Tech approval:MLD                Patient Assist Medication Verification: Medication:Mepron 750mg /39ml Lot#[id number] Exp Date:Oct 12 Tech approval:MLD Call placed to patient with message that assistance medications are ready for pick-up. Left message on VM Paulo Fruit  BS,CPht II,MPH  June 01, 2009 4:45 PM

## 2010-04-12 NOTE — Progress Notes (Signed)
Summary: New med called in/NCADAP meds arrived for May  Phone Note Refill Request      Prescriptions: HYDROCHLOROTHIAZIDE 25 MG TABS (HYDROCHLOROTHIAZIDE) Take 1 tablet by mouth once a day  #30 x 3   Entered by:   Paulo Fruit  BS,CPht II,MPH   Authorized by:   Cliffton Asters MD   Signed by:   Paulo Fruit  BS,CPht II,MPH on 07/26/2009   Method used:   [facility]        [facility] [address] [phone]* (retail)       [address]       [place], [place]  [address]       Ph: [phone] or [phone]       Fax: [phone]   RxID:   [id number] VALTREX 500 MG TABS (VALACYCLOVIR HCL) Take 1 tablet by mouth two times a day as needed  #10 x 0   Entered by:   Paulo Fruit  BS,CPht II,MPH   Authorized by:   Cliffton Asters MD   Signed by:   Paulo Fruit  BS,CPht II,MPH on 07/26/2009   Method used:   Samples Given   RxID:   [id number] DIFLUCAN 100 MG TABS (FLUCONAZOLE) Take 1 tablet by mouth once a week  #4 x 0   Entered by:   Paulo Fruit  BS,CPht II,MPH   Authorized by:   Cliffton Asters MD   Signed by:   Paulo Fruit  BS,CPht II,MPH on 07/26/2009   Method used:   Samples Given   RxID:   [id number] MEPRON 750 MG/5ML SUSP (ATOVAQUONE) 10 ml once daily with food  #300 x 0   Entered by:   Paulo Fruit  BS,CPht II,MPH   Authorized by:   Cliffton Asters MD   Signed by:   Paulo Fruit  BS,CPht II,MPH on 07/26/2009   Method used:   Samples Given   RxID:   [id number] NORVIR 100 MG TABS (RITONAVIR) Take 1 tablet by mouth once a day  #30 x 0   Entered by:   Paulo Fruit  BS,CPht II,MPH   Authorized by:   Cliffton Asters MD   Signed by:   Paulo Fruit  BS,CPht II,MPH on 07/26/2009   Method used:   Samples Given   RxID:   [id number] REYATAZ 300 MG CAPS (ATAZANAVIR SULFATE) Take 1 capsule by mouth once a day  #30 x 0   Entered by:   Paulo Fruit  BS,CPht II,MPH   Authorized by:   Cliffton Asters MD   Signed by:   Paulo Fruit  BS,CPht II,MPH  on 07/26/2009   Method used:   Samples Given   RxID:   [id number] ZIAGEN 300 MG TABS (ABACAVIR SULFATE) Take 2 tablets by mouth once a day  #60 x 0   Entered by:   Paulo Fruit  BS,CPht II,MPH   Authorized by:   Cliffton Asters MD   Signed by:   Paulo Fruit  BS,CPht II,MPH on 07/26/2009   Method used:   Samples Given   RxID:   [id number] VIREAD 300 MG TABS (TENOFOVIR DISOPROXIL FUMARATE) Take 1 tablet by mouth once a day  #30 x 0   Entered by:   Paulo Fruit  BS,CPht II,MPH   Authorized by:   Cliffton Asters MD   Signed by:   Paulo Fruit  BS,CPht II,MPH on 07/26/2009   Method used:   Samples Given   RxID:   [id number]  Patient  Assist Medication Verification: Medication name: Valacyclovir 500mg  RX #  [id number] Tech approval:MLD  Patient Assist Medication Verification: Medication name:Fluconazole 100mg  RX #  [id number] Tech approval:MLD   Patient Assist Medication Verification: Medication: Norvir 100mg  Lot# [id number] Exp Date:10 Dec 2010 Tech approval:MLD                Patient Assist Medication Verification: Medication: Ziagen 300mg  Lot# [id number] Exp Date:Nov 2013 Tech approval:MLD                Patient Assist Medication Verification: Medication:Reyataz 300mg  Lot#[id number] Exp Date:Feb 2013 Tech approval:MLD                Patient Assist Medication Verification: Medication:Viread 300mg  Lot# [id number] Exp Date:07 2014 Tech approval:MLD                Patient Assist Medication Verification: Medication:Mepron 750mg /25ml Lot# [id number] Exp Date:jan 13 Tech approval:MLD   Also told pt that Dr. Orvan Falconer wants to start her on BP med.  She [facility] to [facility]. Call placed to patient with message that assistance medications are ready for pick-up. Paulo Fruit  BS,CPht II,MPH  Jul 26, 2009 11:37 AM

## 2010-04-12 NOTE — Progress Notes (Signed)
  Patient called and said she had a very bad outbreak on her back side that was itching and draining and she needed some valtrex and needed to be seen. She didn't know if it was infected, but it was irritating her very much. Appt made for this afternoon with Dr. Philipp Deputy.Deirdre Evener RN  August 15, 2009 9:17 AM

## 2010-04-12 NOTE — Assessment & Plan Note (Signed)
   Process Orders Check Orders Results:     [facility]: ABN not required for this insurance Order queued for requisitioning for [facility]: September 07, 2009 10:19 AM  Tests Sent for requisitioning (September 07, 2009 10:19 AM):     09/07/2009: [facility] -- T-HIV Viral Load [phone] (signed)

## 2010-04-24 ENCOUNTER — Ambulatory Visit: Payer: Self-pay | Admitting: Internal Medicine

## 2010-04-30 ENCOUNTER — Encounter (INDEPENDENT_AMBULATORY_CARE_PROVIDER_SITE_OTHER): Payer: Self-pay | Admitting: *Deleted

## 2010-05-08 NOTE — Miscellaneous (Signed)
  Clinical Lists Changes  Observations: Added new observation of PCTFPL: 167.69  (04/30/2010 10:26) Added new observation of HOUSEINCOME: 87564  (04/30/2010 10:26)

## 2010-05-22 LAB — T-HELPER CELL (CD4) - (RCID CLINIC ONLY): CD4 T Cell Abs: 210 uL — ABNORMAL LOW (ref 400–2700)

## 2010-05-27 LAB — T-HELPER CELL (CD4) - (RCID CLINIC ONLY)
CD4 % Helper T Cell: 12 % — ABNORMAL LOW (ref 33–55)
CD4 T Cell Abs: 130 uL — ABNORMAL LOW (ref 400–2700)

## 2010-06-04 ENCOUNTER — Ambulatory Visit (INDEPENDENT_AMBULATORY_CARE_PROVIDER_SITE_OTHER): Payer: Self-pay | Admitting: *Deleted

## 2010-06-04 VITALS — BP 159/92 | HR 78 | Temp 98.0°F | Wt 220.4 lb

## 2010-06-04 DIAGNOSIS — B20 Human immunodeficiency virus [HIV] disease: Secondary | ICD-10-CM

## 2010-06-04 LAB — COMPREHENSIVE METABOLIC PANEL
ALT: 11 U/L (ref 0–35)
AST: 15 U/L (ref 0–37)
Albumin: 4.1 g/dL (ref 3.5–5.2)
Calcium: 9.2 mg/dL (ref 8.4–10.5)
Chloride: 104 mEq/L (ref 96–112)
Potassium: 4 mEq/L (ref 3.5–5.3)

## 2010-06-04 NOTE — Progress Notes (Signed)
Pt here for Research Study ACTG 5001, week 256: Pt tearful about mother passing in 03/2010 and c/o being tired. She has been going to counseling. Pt feels she does not need anything, that she needs to get back into her routine. Pt c/o swelling and numbness in her left foot when ambulating. This has not affected her ADL's. Pt has been elevating foot while sitting. Pt also stopped taking her HCTZ since 04/2010 due to not having the time to get prescription filled. Pt stated she has been out of town for [place]. Told patient she should restart her HCTZ and instructed her to call me, her counselor, or her PCP for any questions or concerns. Pt denies any problems taking ARV's. Next research appointment scheduled for Monday, 09/17/2010 at 10:00 am. Tacey Heap

## 2010-06-15 LAB — T-HELPER CELL (CD4) - (RCID CLINIC ONLY)
CD4 % Helper T Cell: 13 % — ABNORMAL LOW (ref 33–55)
CD4 T Cell Abs: 30 uL — ABNORMAL LOW (ref 400–2700)

## 2010-06-17 LAB — T-HELPER CELL (CD4) - (RCID CLINIC ONLY)
CD4 % Helper T Cell: 11 % — ABNORMAL LOW (ref 33–55)
CD4 T Cell Abs: 100 uL — ABNORMAL LOW (ref 400–2700)

## 2010-06-18 ENCOUNTER — Encounter: Payer: Self-pay | Admitting: Internal Medicine

## 2010-06-18 LAB — DIFFERENTIAL
Basophils Absolute: 0 10*3/uL (ref 0.0–0.1)
Basophils Relative: 0 % (ref 0–1)
Basophils Relative: 1 % (ref 0–1)
Eosinophils Absolute: 0.1 10*3/uL (ref 0.0–0.7)
Eosinophils Relative: 4 % (ref 0–5)
Eosinophils Relative: 4 % (ref 0–5)
Lymphocytes Relative: 23 % (ref 12–46)
Lymphs Abs: 0.7 10*3/uL (ref 0.7–4.0)
Lymphs Abs: 1 10*3/uL (ref 0.7–4.0)
Monocytes Absolute: 0.4 10*3/uL (ref 0.1–1.0)
Monocytes Relative: 15 % — ABNORMAL HIGH (ref 3–12)
Neutrophils Relative %: 55 % (ref 43–77)

## 2010-06-18 LAB — JC VIRUS, PCR CSF: JC Virus PCR, CSF: NEGATIVE

## 2010-06-18 LAB — CSF CELL COUNT WITH DIFFERENTIAL
Lymphs, CSF: 80 % (ref 40–80)
Monocyte-Macrophage-Spinal Fluid: 18 % (ref 15–45)
RBC Count, CSF: 2 /mm3 — ABNORMAL HIGH
Segmented Neutrophils-CSF: 2 % (ref 0–6)
Tube #: 1

## 2010-06-18 LAB — CSF CULTURE W GRAM STAIN

## 2010-06-18 LAB — HIV-1 RNA QUANT-NO REFLEX-BLD
HIV-1 RNA Quant, Log: 4.65 {Log} — ABNORMAL HIGH (ref <1.68)
HIV-1 RNA Viral Load: <40

## 2010-06-18 LAB — BASIC METABOLIC PANEL
BUN: 3 mg/dL — ABNORMAL LOW (ref 6–23)
BUN: 4 mg/dL — ABNORMAL LOW (ref 6–23)
CO2: 29 mEq/L (ref 19–32)
CO2: 29 mEq/L (ref 19–32)
Calcium: 8.6 mg/dL (ref 8.4–10.5)
Calcium: 9.2 mg/dL (ref 8.4–10.5)
Chloride: 100 mEq/L (ref 96–112)
Chloride: 105 mEq/L (ref 96–112)
GFR calc Af Amer: >60 mL/min (ref >60)
GFR calc non Af Amer: >60 mL/min (ref >60)
GFR calc non Af Amer: >60 mL/min (ref >60)
Glucose, Bld: 105 mg/dL — ABNORMAL HIGH (ref 70–99)
Glucose, Bld: 121 mg/dL — ABNORMAL HIGH (ref 70–99)
Potassium: 3.5 mEq/L (ref 3.5–5.1)
Sodium: 135 mEq/L (ref 135–145)
Sodium: 139 mEq/L (ref 135–145)

## 2010-06-18 LAB — AFB CULTURE WITH SMEAR (NOT AT ARMC): Acid Fast Smear: NONE SEEN

## 2010-06-18 LAB — CBC
HCT: 36 % (ref 36.0–46.0)
HCT: 37.5 % (ref 36.0–46.0)
Hemoglobin: 12.8 g/dL (ref 12.0–15.0)
MCHC: 33.5 g/dL (ref 30.0–36.0)
MCHC: 34.2 g/dL (ref 30.0–36.0)
MCV: 89.8 fL (ref 78.0–100.0)
Platelets: 159 10*3/uL (ref 150–400)
Platelets: 171 10*3/uL (ref 150–400)
RBC: 4.04 MIL/uL (ref 3.87–5.11)
RDW: 12.7 % (ref 11.5–15.5)
RDW: 12.9 % (ref 11.5–15.5)
WBC: 3.1 10*3/uL — ABNORMAL LOW (ref 4.0–10.5)

## 2010-06-18 LAB — PROTEIN AND GLUCOSE, CSF
Glucose, CSF: 56 mg/dL (ref 43–76)
Total  Protein, CSF: 54 mg/dL — ABNORMAL HIGH (ref 15–45)

## 2010-06-18 LAB — T-HELPER CELLS (CD4) COUNT (NOT AT ARMC): CD4 % Helper T Cell: 9 % — ABNORMAL LOW (ref 33–55)

## 2010-06-18 LAB — CD4/CD8 (T-HELPER/T-SUPPRESSOR CELL)
CD8 % Suppressor T Cell: 43.3
CD8: 606

## 2010-06-18 LAB — FUNGUS CULTURE W SMEAR

## 2010-06-18 LAB — CRYPTOCOCCAL ANTIGEN, CSF: Crypto Ag: NEGATIVE

## 2010-06-18 LAB — HSV PCR: HSV 2 , PCR: NOT DETECTED

## 2010-06-18 LAB — PREGNANCY, URINE: Preg Test, Ur: NEGATIVE

## 2010-06-26 ENCOUNTER — Ambulatory Visit (INDEPENDENT_AMBULATORY_CARE_PROVIDER_SITE_OTHER): Payer: Self-pay | Admitting: Internal Medicine

## 2010-06-26 ENCOUNTER — Encounter: Payer: Self-pay | Admitting: Internal Medicine

## 2010-06-26 VITALS — BP 159/100 | HR 86 | Temp 97.9°F | Ht 61.0 in | Wt 221.2 lb

## 2010-06-26 DIAGNOSIS — K625 Hemorrhage of anus and rectum: Secondary | ICD-10-CM | POA: Insufficient documentation

## 2010-06-26 DIAGNOSIS — I10 Essential (primary) hypertension: Secondary | ICD-10-CM

## 2010-06-26 DIAGNOSIS — B20 Human immunodeficiency virus [HIV] disease: Secondary | ICD-10-CM

## 2010-06-26 MED ORDER — HYDROCHLOROTHIAZIDE 25 MG PO TABS: 25.0000 mg | ORAL_TABLET | Freq: Every day | ORAL | Status: DC

## 2010-06-26 NOTE — Progress Notes (Signed)
  Subjective:    Patient ID: [name] [name], female    DOB: 05-12-1955, 55 y.o.   MRN: [id number]  HPI [name] [name] is in for her routine visit. She recalls missing only one dose of her HIV medicines since her last visit in November. She says that she missed it because she simply did not want to take her medication that day. She has been having some numbness on the sole of her left foot. She also has some chronic discomfort in her knees when she is standing and walking. She says that she's been having more fatigue during the day. She is getting up about 3 times each night to pass her urine. He does not feel depressed or anxious. Over the last few weeks he's had several episodes where she's noted some bright red blood mixed in with her stool and on the toilet paper. She has not had any pain with bowel movements and denies any constipation. She has not been sexually active. She has been out of her blood pressure medication and states that she's been having some financial difficulty paying for it.   Review of Systems     Objective:   Physical Exam  Constitutional: No distress.       Obese.  HENT:  Mouth/Throat: Oropharynx is clear and moist. No oropharyngeal exudate.  Cardiovascular: Normal rate and regular rhythm.   No murmur heard. Pulmonary/Chest: Breath sounds normal. She has no wheezes.  Abdominal: Soft. Bowel sounds are normal. She exhibits no distension and no mass. There is no tenderness.  Musculoskeletal: Normal range of motion.       No pain, swelling or other signs of inflammation in her knees.  Neurological:       The sensation in her feet is grossly intact. There is no swelling or signs of inflammation and she has good pulses          Assessment & Plan:

## 2010-06-26 NOTE — Assessment & Plan Note (Signed)
Her blood pressure is not well controlled because she is off of her medication. I have given her a prescription for a 90 day supply with when necessary refills and told her to have it filled at [facility] for $10. Her creatinine and potassium are normal.

## 2010-06-26 NOTE — Assessment & Plan Note (Signed)
We will try to make a referral for colonoscopy.

## 2010-06-26 NOTE — Assessment & Plan Note (Signed)
Her infection has come under better control in the past year. Her adherence has improved and her viral load is undetectable at less than 40 in her CD4 count has risen to 267. I will stop her prophylactic fluconazole.

## 2010-07-03 ENCOUNTER — Telehealth: Payer: Self-pay | Admitting: *Deleted

## 2010-07-03 NOTE — Telephone Encounter (Signed)
Please tell [name] [name] that I do not feel she is disabled and, therefore, cannot give here a card or letter stating that she is.

## 2010-07-03 NOTE — Telephone Encounter (Signed)
Patient notified

## 2010-07-03 NOTE — Telephone Encounter (Signed)
Patient called requesting a letter for [facility] stating she is disabled for a handicap placard.  She got a ticket for using her husbands placard, who is now deceased.  She is needing a letter to take to court and a placard for herself. Wendall Mola CMA

## 2010-07-24 NOTE — Discharge Summary (Signed)
NAME:  [name] [name], [name] [name] NO.:  [id number]   MEDICAL RECORD NO.:  [id number]          PATIENT TYPE:  OBV   LOCATION:  3028                         FACILITY:  [facility]   PHYSICIAN:  Waldemar Dickens, MD     DATE OF BIRTH:  April 11, 1955   DATE OF ADMISSION:  08/30/2008  DATE OF DISCHARGE:  09/02/2008                               DISCHARGE SUMMARY   DISCHARGE DIAGNOSES:  1. Headache, likely due to viral meningitis.  2. Human immunodeficiency virus.  3. Depression.  4. History of genital herpes.  5. Degenerative joint disease.  6. Anemia.  7. Gastroesophageal reflux disease.   DISCHARGE MEDICATIONS:  1. Truvada 200-300 mg p.o. daily.  2. Sustiva 600 mg p.o. daily.  3. Valtrex 500 mg p.o. b.i.d. p.r.n.  4. Mepron 750 mg/5 mL, take 10 mL daily with food.  5. Vicodin 5/500 mg 1 tablet p.o. q.6 h. p.r.n. for pain.  6. Phenergan 12.5 mg p.o. q.6 h. p.r.n. for nausea.   DISPOSITION/FOLLOWUP:  [name] [name] was discharged from the hospital on  September 02, 2008, in a stable and improved condition.  Her headache had  improved and she had no fever, the neck stiffness also has improved.  She will have an appointment with Dr. Orvan Falconer on September 14, 2008, at 2  p.m. to check her headache and neck stiffness.  We also told the patient  that if her headache gets worse or develops any fever, chills, or  changes in her vision and mental status, she should call the clinic or  come to the emergency department for further evaluation.   CONSULTATION:  Acey Lav, MD of Infectious Disease.   PROCEDURES PERFORMED:  1. CT of head without contrast did not show any acute intracranial      abnormality.  MRI of brain without and with contrast show some mild      atrophy and small vessel disease, no apparent acute intracranial      findings.  2. Lumbar puncture on August 30, 2008, with opening pressure of 19 cm of      water and 8-10 mL of CSF fluid was obtained for laboratory study.   ADMISSION  HISTORY:  The patient is a 55 year old African American female  with past medical history of HIV with CD-4 about 100 on ART treatment.  She came to the ED presenting with headache.  The headache started when  she was watching TV 3 days prior to admission.  It was constant, waxing  and waning, mostly 4-5/10 in severity, the worst severity is about 9-  10/10.  It is located all over the head and also associated with right  ear fullness, but no drainage.  She also had mild neck stiffness and  movements made her headache worse, but rest and quietness slightly made  her headache better.  About 1 day prior to admission, she found that she  still had bad headache, so she went to the ED for further evaluation.  She had no fever, nausea, vomiting, diarrhea, and no sick contacts.  No  wheezing change.  No loss of consciousness.  Denies diarrhea or dysuria.   ADMISSION PHYSICAL EXAMINATION:  VITAL SIGNS:  Temperature 98.3, blood  pressure 142/96, heart rate 85, respiration rate 18, and O2 sat 99 on  room air.  GENERAL:  The patient is in moderate acute distress for headache.  EYES:  Extraocular eye movement intact, reactive to light.  ENT:  Moist mucosa.  NECK:  Supple.  No thyroid enlargement.  LUNGS:  Clear to auscultation bilaterally.  No wheezing, crackle, or  rales.  HEART:  Regular rate and rhythm.  No murmur.  ABDOMEN:  Soft.  Bowel sounds positive.  No tenderness or distention.  EXTREMITIES:  No edema or rash.  NEURO:  Alert and oriented x3.  Cranial nerves II through XII intact.  Deep tendon reflex 2+.  Sensation is normal.  No focal neurological  findings.   ADMISSION LABORATORIES:  CBC, white blood cell 3.4, hemoglobin 11.8,  platelets 171, MCV 90.7.  BMET, sodium 138, potassium 3.4, chloride 105,  bicarb 29, BUN 4, creatinine 0.6, glucose 105, calcium 8.6.  CD-4 of 90.  Urine pregnancy test negative.   HOSPITAL COURSE:  1. Headache likely due to viral meningitis.  The patient has  HIV      disease with CD-4 about 100 on ART treatment.  Because the patient      has a headache with neck stiffness, we suspect that the patient may      have meningitis, so we did lumbar puncture and found that opening      pressure was 19 cm of water and we also did CSF analysis including      Cryptococcal antigen, HSV, CMV, and JC virus as well as RPR, which      were all negative and we also did the AFB and fungal culture, which      was pending.  The urine Histoplasma antigen also negative.  CSF      shows a white blood cell 91 and RBC 2 and most of the white blood      cells is lymphocyte, the lymphocytosis 80%.  So, based on these      findings we thought that the patient likely has viral meningitis.      On admission, the patient had been giving initially vancomycin,      ceftazidime, and ampicillin, and once we found that the CSF shows      lymphocytosis, we changed the antibiotics to acyclovir and we also      asked ID consult by Dr. Daiva Eves. He also thinks that the patient      most likely has viral meningitis, which may be due to Enterovirus      or arborvirus even HSV and CMV negative.  Since the patient's      headache and neck stiffnesshas gradually improved during the      hospitalization, the patient was discharged from the hospital. She      will continue to take her HIV medication and we also have made an      appointment with her primary doctor, Dr. Orvan Falconer to check her      symptoms. If no further improvement at that time we may have      further evaluation and may restart Valtrex t.i.d.. we also gave the      patient Vicodin and Phenergan to treat her headache and nausea      symptomatically.  2. HIV.  During the hospitalization, we rechecked her CD-4, which is  90 and HIV RNA viral load is 44,600.  The patient will continue to      take her discharge medication including Truvada, Sustiva, and      Mepron.  She may need to adjust her HIV medication at next  visit      with Dr. Orvan Falconer.  3. Hypokalemia.  On admission, her potassium was 3.4, so we repleted      with KCl.  On discharge her potassium was 3.6.   DISCHARGE VITALS:  Temperature 98.5, blood pressure 152/90, pulse 88,  respiration rate 18, O2 sat 98 on room air.   DISCHARGE LABS:  CBC, hemoglobin 12.8, white blood cell 3.1, platelet  167.  Sodium 135, potassium 3.6, chloride 100, bicarb 29, BUN 3,  creatinine 0.63, glucose 121, calcium 9.2. CSF culture negative, JC  virus negative, urine histoplama negative   PENDING LABS:  AFB culture, fungus culture.      Jackson Latino, MD  Electronically Signed      Waldemar Dickens, MD  Electronically Signed    ZY/MEDQ  D:  09/05/2008  T:  09/06/2008  Job:  [id number]   cc:   Outpatient Clinic

## 2010-07-24 NOTE — Discharge Summary (Signed)
NAME:  [name] [name], [name] [name]              ACCOUNT NO.:  [id number]   MEDICAL RECORD NO.:  [id number]          PATIENT TYPE:  WOC   LOCATION:  WOC                          FACILITY:  [facility]   PHYSICIAN:  Phil D. Okey Dupre, M.D.     DATE OF BIRTH:  Dec 23, 1955   DATE OF ADMISSION:  11/11/2007  DATE OF DISCHARGE:                               DISCHARGE SUMMARY   REASON FOR VISIT:  Annual exam, referred from ID clinic.   HISTORY OF PRESENT ILLNESS:  The patient is a 54 year old black female  who is HIV positive and here for routine Pap smear, referred from ID  clinic. The patient's last menstrual period was approximately three  years ago. Since that time, she has not had any bleeding or spotting.  The patient's last Pap smear was in 2007, and her last mammogram she  thinks was in 2007 as well. She comes with no complaints today per her  report. Her last viral load was in the 300s, and her CD4 count was  around 49-50. No records from the ID clinic are available at this time.   PAST MEDICAL HISTORY:  Is significant for an ectopic pregnancy with  salpingo-oophorectomy. She has also had fibroid tumors removed.   ALLERGIES:  PENICILLIN AND SULFA.   FAMILY HISTORY:  Negative for breast cancer, colon cancer, ovarian  cancer.   PHYSICAL EXAMINATION:  Temperature is 97.3, pulse 69, blood pressure  139/84. Weight is 95 kg, height 59-1/2 inches.  HEENT:  Extraocular muscles are intact. Mucous membranes are moist. Head  is normocephalic, atraumatic.  CARDIOVASCULAR:  Regular rate and rhythm. No murmurs, rubs, or gallops.  LUNGS:  Clear to auscultation anteriorly. Work of breathing is  unlabored.  ABDOMEN:  Positive bowel sounds, soft, obese, nontender. No masses  palpated.  GENITOURINARY:  External vaginal exam is normal to inspection. Labial  skin is normal and intact. Vaginal introitus is normal with pink mucosa  that is somewhat atrophic. Cervix:  Os is closed, is found to be very  posterior, and  towards the patient's right, there is physiologic  cervical discharge. Associated Pap smear is performed.  EXTREMITIES:  2+ pulses. No lower extremity edema, cyanosis or clubbing.  SKIN:  Is intact, warm, well perfused without any obvious lesions.   ASSESSMENT:  Normal GYN exam, normal breast exam and otherwise normal  exam.   DISPOSITION:  1. Pap smear was performed today.  2. The patient will be scheduled for a mammogram for annual screening.  3. The patient is to return to clinic in 12 months for repeat routine      annual examination.      Myrtie Soman, MD      Javier Glazier. Okey Dupre, M.D.  Electronically Signed    TE/MEDQ  D:  11/11/2007  T:  11/11/2007  Job:  [id number]

## 2010-07-24 NOTE — Consult Note (Signed)
NAME:  [name] [name], [name] [name] NO.:  [id number]   MEDICAL RECORD NO.:  [id number]          PATIENT TYPE:  OBV   LOCATION:  3028                         FACILITY:  [facility]   PHYSICIAN:  Acey Lav, MD  DATE OF BIRTH:  1955-05-11   DATE OF CONSULTATION:  DATE OF DISCHARGE:                                 CONSULTATION   REASON FOR INFECTIOUS DISEASE CONSULTATION:  The patient with aseptic  meningitis and HIV.   HISTORY OF PRESENT ILLNESS:  [name] [name] is a 55 year old African American  lady with past medical history significant for HIV/AIDS diagnosed in  2007 with a CD-4 count at that time of 90, who has been intermittently  compliant with antiretroviral therapy.  She was recently seen by Dr.  Orvan Falconer in April at which point in time she was off of her Sustiva and  Truvada, and had a viral load in excess of 100,000 and a CD-4 count that  had dropped to 100 having been as high as 400 in 2008.  She attributes  her not taking her medications due to multiple stressors.  She does care  for her husband who has multiple medical problems including chronic  kidney disease with him being on dialysis and also cervical disk disease  with his having undergone multiple cervical spine surgeries.  She claims  to have been on Sustiva and Truvada for the last month.  She also has a  history notable for genital herpes that has been recurrent and for which  she has been taking Valtrex.  The patient had developed acute onset of  fevers, chills and severe headaches that developed this past Saturday  night while lying in bed.  Headache persisted throughout the day in her  head, scalp and forehead with radiation down her neck and right  shoulder.  Symptoms persisted.  She also developed some nausea.  She was  admitted to the B-service having undergone lumbar puncture.  Lumbar  puncture showed 91 white blood cells with 80% lymphocytes, protein was  54 and glucose 56.  Her STAT cryptococcal  antigen was negative and no  growth.  She has had no growth on spinal fluid to date.  Her CD-4 check  this admission was as low as 90.  She has been placed on acyclovir on  August 31, 2008.  She continues to have severe headaches that have not  responded terribly well to treatment with Tylenol and Vicodin.  Her  spinal fluid has been sent for CMV by PCR as well as herpes simplex by  PCR.  She also has had spinal fluid sent for acid-fast bacilli, fungal  stain and culture.  Her opening pressure was 19 under fluoro-guided  lumbar puncture.   Her recent review of systems is notable for lack of weight loss.  Pertinent for herpes outbreak approximately a month ago.  Pertinent for  no sick contacts at home other than her chronically ill husband.  She  has had no significant travel other than having traveled to [place]  approximately a year ago.  Otherwise, [place] never been out of the country and never been  exposed the patient anyone with tuberculosis.  She had an MRI done this  admission.  This showed some atrophy and small vessel changes, but no  intracranial findings.  No evidence of toxoplasmosis, primary CNS  lymphoma or evidence of encephalitis or meningitis.   We are asked to see this patient to assist in workup of her aseptic  meningitis.   PAST MEDICAL HISTORY:  1. HIV/AIDS as described above.  2. Genital herpes.  3. History of severe thrush at admission with hypotension at that      time.  4. Also apparently with a rash in 2007.  This was blamed on Bactrim.  5. History of chronic anemia.  6. Depression.   PAST SURGICAL HISTORY:  No recent surgeries.   SOCIAL HISTORY:  No tobacco.  No recreational drugs.  She is married and  unemployed.  Her husband as mentioned is on dialysis.   FAMILY HISTORY:  Mother has arthritis and father died of prostate  cancer.   REVIEW OF SYSTEMS:  As described in the history of present illness.   Otherwise, she denies weight loss and otherwise 10-point review of  systems is negative.   ALLERGIES:  1. Reportedly allergic to PENICILLIN.  2. Reportedly allergic to TRIMETHOPRIM SULFA which she believes caused      her to have a rash.  She thinks that this caused her need for      admission in 2007, although this is unclear.  Of note, the      discharge summary of 2007 says that she had nausea, vomiting, fever      and diarrhea at that time.  Oral candidiasis was impugned, but      there was also some concern apparently for an acute drug reaction,      although it is not very clear to me why a drug reaction was      impugned during that admission.  3. In any case, she believes she has a severe reaction to BACTRIM.  4. There is also mention of allergic reaction to DAPSONE in a later      discharge summary, but this also seems it bit odd.  She has      apparently been refusing to take the dapsone here at her admission.      She has been on dapsone and atovaquone.   CURRENT MEDICATIONS:  1. Acyclovir 225 mg every 8 hours.  2. Mepron 1500 mg daily.  3. Dapsone 100 mg daily.  She states she has been refusing this one.  4. Benadryl.  5. Sustiva 1 tablet at bedtime.  6. Truvada 1 tablet at bedtime.  7. Zofran.  8. Potassium chloride.  9. Tylenol.  10.Vicodin.  11.Zofran.   PHYSICAL EXAMINATION:  VITAL SIGNS:  Blood pressure 143/87, pulse 82,  respirations 18, pulse ox 100% on room air, temperature maximum of 100.1  on August 30, 2008.  Overnight, she has been afebrile.  Current  temperature 97.9.  Her weight is 97 kg.  GENERAL:  Pleasant lady appears a bit underweight.  HEENT:  Normocephalic, atraumatic.  She has a little bit of acneiform-  type rash on her cheeks.  Her oropharynx has some slight coating to her  tongue.  Posterior oropharynx is without significant erythema.  CARDIOVASCULAR:  Regular rate and rhythm.  No murmurs, gallops or rubs.  LUNGS:  Clear to auscultation  bilaterally without wheezes or rales.  ABDOMEN:  Soft,  nondistended, nontender.  EXTREMITIES:  Without edema.  SKIN:  No rashes other than facial rash.   DIAGNOSTICS:  MRI as described above.   LABORATORY DATA:  CSF analysis as described above.  CBC with  differential:  White count 3.1, hemoglobin 12.4, platelets 159, ANC of  1.8.  Metabolic panel:  Creatinine 0.65, potassium 0.5, otherwise  unremarkable BMP.  Spinal fluid studies as described above.   IMPRESSION/RECOMMENDATIONS:  This is a 55 year old African American lady  with human immunodeficiency virus/acquired immunodeficiency disease who  presents with aseptic meningitis, who also has a history of genital  herpes.  1. Aseptic meningitis:  This is likely due to herpes simplex type 2      versus enterovirus, versus arbovirus infections such as West Nile      virus.  She certainly has no evidence of herpes simplex virus      encephalitis and she has no temporal lobe enhancement to suggest      this.  She could certainly very well have herpes meningitis though.      I think it is reasonable to give her therapy with acyclovir and      then when she can take it, to change her to oral Valtrex.  I agree      with the herpes simplex PCR that you sent off and the CMV PCR.  She      does not have CMV encephalitis and I do not think that the CMV is      causing her meningitis.  Given that her MRI shows no lesions      concerning for toxoplasmosis or primary CNS lymphoma, I do not feel      the need to check a toxoplasma IgG or a toxoplasma PCR on spinal      fluid or Epstein-Barr virus PCR other than spinal fluid.  I would      check an RPR and check for prozone in her serum.  She was      previously negative.  I would also check an arbovirus on CSF and      serum as well as an enterovirus PCR.  If she is able to take pills,      one can change her to Valtrex 1 gm t.i.d. and continue this for a      total of 14 days.  2. Human  immunodeficiency virus.  I think Sustiva and Truvada are      probably not good drugs for her and this was Dr. Blair Dolphin thought      as well when he offered to change her meds before.  I am sending      off a viral load with a genotype.  I think she is probably going to      be changed to a different regimen possibly Norvir, darunavir and      Truvada.  We could potentially make that switch during this      hospitalization versus when she follows up with Dr. Orvan Falconer.  In      any case, she is in dire need of antiretroviral therapy.  With that      thought, I think I might very well change her to those medications      during this hospital stay.  3. Questionable history of sulfa allergy.  She has not been taking her      dapsone, so I am just going to place her on atovaquone.  It is not  nearly as good for PCP prophylaxis and it provides no prophylaxis      against toxoplasmosis, but she at least is willing to take this.   Thank you for this Infectious Disease consultation.  I will follow along  closely.      Acey Lav, MD  Electronically Signed     CV/MEDQ  D:  09/01/2008  T:  09/01/2008  Job:  [id number]

## 2010-07-27 NOTE — Group Therapy Note (Signed)
NAME:  [name] [name], RAWLINSON NO.:  [id number]   MEDICAL RECORD NO.:  [id number]          PATIENT TYPE:  WOC   LOCATION:  [facility]                   FACILITY:  [facility]   PHYSICIAN:  Dorthula Perfect, MD     DATE OF BIRTH:  [birth year]   DATE OF SERVICE:                                    CLINIC NOTE   This 55 year old black female, gravida 6, para 4, abortus 2, is here for  routine Pap smear.  Her last menstrual period was 2 1/2 to 3 years ago.  Since that time, she has had no bleeding or spotting.  Her last Pap smear  was in 2004 which she describes as normal.  Her last mammogram was also in  2004.  She has no complaints.   PAST MEDICAL HISTORY:  No operations.   ALLERGIES:  PENICILLIN AND SULFA.   FAMILY HISTORY:  Negative.  There is no history of breast cancer, colon  cancer, or ovarian cancer.   PHYSICAL EXAMINATION:  VITAL SIGNS:  Blood pressure 132/78, weight 167.  NECK:  Thyroid is normal.  BREASTS:  Her breasts are bilaterally normal.  No masses or abnormalities  were detected.  ABDOMEN:  Flat, soft, nontender.  No masses are palpable.  PELVIC EXAMINATION:  External genitalia and BUS glands are normal.  She is  not sexually active, so the vaginal introitus is somewhat narrowed.  The  vagina is normal.  The cervix is deviated somewhat to the right and is  normal.  The uterus is of normal size and shape.  The ovaries are not  palpable.  There are no adnexal masses noted.   IMPRESSION:  Normal GYN exam.   DISPOSITION:  1.  Pap smear.  2.  She will be scheduled for mammogram.  3.  She will be given a hemoccult card to take home and then return.  4.  She is told that if this Pap smear is normal that she need only have a      Pap smear every 2-3 years but she should have annual mammograms.           ______________________________  Dorthula Perfect, MD     ER/MEDQ  D:  10/10/2005  T:  10/10/2005  Job:  [id number]

## 2010-07-27 NOTE — Consult Note (Signed)
NAME:  NOTNAMED, CROUCHER NO.:  [id number]   MEDICAL RECORD NO.:  [id number]          PATIENT TYPE:  INP   LOCATION:  3740                         FACILITY:  [facility]   PHYSICIAN:  Learta Codding, M.D. LHCDATE OF BIRTH:  1955-08-18   DATE OF CONSULTATION:  06/03/2005  DATE OF DISCHARGE:                                   CONSULTATION   REFERRING PHYSICIAN:  Ileana Roup, M.D. with the teaching service.   REASON FOR CONSULTATION:  Evaluation of presumed new EKG changes and  abnormal cardiac enzyme markers.   HISTORY OF PRESENT ILLNESS:  The patient is a 55 year old female with a  history of AIDS who was recently admitted with fever, nausea, vomiting, and  diarrhea.  During her hospitalization the patient became hypotensive.  She  also has been neutropenic.  She has been treated for neutropenic fevers.  On  admission the thought was that her nausea and vomiting was secondary to a  sulfa allergic reaction.  The patient also presented with renal  insufficiency presumed secondary to dehydration.  The patient currently  remains on antibiotic coverage and is being treated for the possibility of  sepsis versus pyelonephritis or possible enteritis.  The patient also has  multiple electrolyte abnormalities.  The patient denies any recent  substernal chest pain.  She does have a history of previous cocaine use but  has not used in the last couple of years.  She denies any recent tobacco  use.  She has no prior history of heart disease or HIV cardiomyopathy.   The patient was felt to have an episode of ventricular tachycardia.  This  prompted team to obtain cardiac enzyme markers.  Troponin has been mildly  elevated.  There is also question whether there is new EKG changes which are  commented on below.  During my review the patient denies any substernal  chest pain, she denies any recent shortness of breath.  She has no prior  history of heart disease.   ALLERGIES:  POSSIBLE  SULFA.  PENICILLIN.   PAST MEDICAL HISTORY:  HIV.  CD4 count is 60.  Diagnosed 04/22/05.  History  of thrush.  History of anemia.  History of ectopic pregnancy status post  right ovarian resection.  History of fibroids.   CURRENT MEDICATIONS:  Enoxaparin subcu.  DVT prophylaxis.  Gabapentin.  Promethazine.  Fluconazole.  Vancomycin.  Primaxin.  Magnesium sulfate.  Vicodin p.r.n.   SOCIAL HISTORY:  The patient lives in [place] with her husband.  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  She denies any tobacco or alcohol use.  She does  have a prior history of cocaine use.   FAMILY HISTORY:  Noncontributory.   REVIEW OF SYSTEMS:  As documented in the primary care notes and include  fevers, chills and sweats and no recent headaches, no visual changes, oral  thrush, no chest pain, shortness of breath or dyspnea on exertion, no edema,  no palpitations, frequency or urgency.  Positive for weakness, positive for  myalgias but no arthralgias, positive for nausea and vomiting as well as  diarrhea.  No  polyuria or polydipsia.   PHYSICAL EXAMINATION:  VITAL SIGNS:  Blood pressure 120/58, heart rate 82  beats per minute, temperature currently 98.2  GENERAL:  An African-American female in no apparent distress.  HEENT:  Pupils are isochoric, conjunctivae clear.  NECK:  Supple, normal carotid upstrokes, no carotid bruits.  Central line is  in place.  LUNGS:  Clear.  HEART:  Regular rate and rhythm.  Normal S1 and S2.  No murmurs, rubs, or  gallops.  ABDOMEN:  Soft, nontender, no rebound, good bowel sounds.  EXTREMITIES:  Reveals no clubbing, cyanosis, or edema.  NEUROLOGIC:  The patient is alert and oriented, grossly nonfocal.   LABORATORY DATA:  Hemoglobin 10.1, platelet count 137, white count 1.6.  Sodium 148, potassium 3.3, chloride 120, bicarbonate 20, BUN 5, creatinine  0.8, albumin 2.4, CK 1499, troponin 0.29, calcium 6.9.  A 2-D echo is  currently pending.  Chest x-ray reveals no active  cardiopulmonary disease.   PROBLEM LIST:  1.  Mild cardiac troponin elevation of a point nonspecific without chest      pain or evidence of acute coronary syndrome.  2. Nonspecific EKG      changes.  (Lead misplacement in the precordial leads and possible lead      switch in the extremity leads).  3. Status post nausea and vomiting      secondary to __________.  4. Hypotension.  __________ dehydration versus      sepsis /SIRS syndrome.  5. Artifactual arrhythmia (secondary to      baseline artifact).  6. AIDS.  7. Fever and neutropenia.  8.      Hyponatremia and hyperkalemia.   PLAN:  1.  I reviewed the electrocardiograms.  There is no evidence of an acute      coronary syndrome on the electrocardiogram.  Poor R-wave progression in      the precordial leads can be explained by lead misplacement.  There also      appears to be a switch between leads aVL and aVF.  There does appear to      be a small Q-wave in 3 but not in the other extremity leads and      therefore this is nondiagnostic.  2. I do not think the patient has an      acute coronary syndrome as she has few risk factors.  She does have a      history of cocaine use.  However, at the present time would only give      her aspirin.  Low-dose beta blocker can be considered if the patient's      blood pressure can tolerate this although this is likely not a      significant therapy.  3. I suspect the cardiac troponin elevations are      nonspecific and likely related to her underlying sepsis/SIRS syndrome      without definite evidence of acute coronary syndrome.  Cardiac      troponin's are very sensitive for myocardial injury.  Therefore I can      also not exclude possible myocarditis or HIV cardiomyopathy.  It is also      possible that cardiac troponin's are clinically mildly elevated      particularly secondary to a recent renal insufficiency.  4. If the 2-D     echocardiogram is within normal limits I would not pursue any  further      cardiac workup at this point and time.  If the  ejection      fraction is normal and there are no segmental wall motion abnormality      the patient can be continued on her current medical therapy.  5. Review      of the rhythm strips do indeed demonstrate that the patient had no      significant arrhythmia but this was secondary to baseline artifact.      Learta Codding, M.D. [facility]  Electronically Signed     GED/MEDQ  D:  06/03/2005  T:  06/04/2005  Job:  [id number]   cc:   Ileana Roup, M.D.  Fax: [phone]   Learta Codding, M.D. [facility]  [address]  [address]  [place]  [place] [address]

## 2010-07-27 NOTE — Discharge Summary (Signed)
NAME:  JOSELYNN, AMOROSO NO.:  [id number]   MEDICAL RECORD NO.:  [id number]          PATIENT TYPE:  INP   LOCATION:  3740                         FACILITY:  [facility]   PHYSICIAN:  Ileana Roup, M.D.  DATE OF BIRTH:  1955/08/12   DATE OF ADMISSION:  05/31/2005  DATE OF DISCHARGE:  06/07/2005                                 DISCHARGE SUMMARY   PRIMARY CARE PHYSICIAN:  [facility].   DISCHARGE DIAGNOSES:  1.  Nausea, vomiting, and diarrhea with fever secondary to acute drug      reaction from Bactrim versus oral Candidiasis.  2.  Nonspecific electrocardiogram changes with mild cardiac troponin      elevation without chest pain.  3.  Hypotension secondary to volume depletion and sepsis versusSIRS      syndrome.  4.  Human immunodeficiency virus/acquired immunodeficiency syndrome newly      diagnosed in February of 2007.  CD4 count 90.  Started on Dapsone for      PCP prophylaxis.  5.  Electrolyte abnormalities, hypokalemia, hyponatremia secondary to      nausea, vomiting, and diarrhea.  N/V/D.  6.  History of gastroesophageal reflux disease with strictures, needs follow-      up endoscopy as an outpatient.  7.  Hypertension.  8.  Diffuse myalgias and elevated CK secondary to sepsis.  9.  Acute renal insufficiency secondary to volume depletion.  10. History of cocaine use.   DISCHARGE MEDICATIONS:  1.  Diflucan 200 mg p.o. daily for a total of 3 weeks of antibiotics.  2.  Dapsone 100 mg p.o. daily for PCP prophylaxis.  3.  Lopressor 25 mg p.o. b.i.d.  4.  Aspirin 325 mg p.o. daily.  5.  Neurontin 300 mg p.o. daily.   DISPOSITION:  Ms. Loudin has been discharged home in fair condition to follow  up with ID physician, Dr. Orvan Falconer in [facility] on June 18, 2005.  At the time of follow-up, need to address these issues:  1.  Newly diagnosed HIV/AIDS.  CD4 count of 90.  Plan that she would be good      ALTG candidate, probably needs enrollment for  therapeutic trial.  2.  Clinical improvement post discharge.  3.  Check a CK level to follow-up __________  down.  Last CK total was 1537,      admission CK was 2157.  4.  Check a BMET to follow-up on electrolytes.  Discharge creatinine 0.6.   CONSULTATIONS:  1.  ID by Lacretia Leigh. Ninetta Lights, M.D. and Rockey Situ. Roxan Hockey, M.D.  2.  CCM for management of shock and central access.   PROCEDURE:  1.  Central line placement on June 01, 2005.  2.  Two-dimensional echocardiogram done on June 04, 2005, showed overall      left ventricular systolic function normal.  No LV or wall motion      abnormality.   HISTORY OF PRESENT ILLNESS:  Ms. Wease is a 55 year old African-American  female with past medical history significant for HIV, CD4 count of 90, who  presents to the emergency department  with 1-day history of nausea, vomiting,  diarrhea, and fever.  She has not been feeling well apparently for about 1  month with sore throat and fatigue.  She called the clinic where she was  first seen on March 1, and was called in a prescription for Diflucan for  thrush.  She developed nausea, vomiting, and diarrhea around the time she  took the Diflucan.   She did complain of fever with night sweats and chills and __________ .   PHYSICAL EXAMINATION:  VITAL SIGNS:  Temperature 102.5, pulse 106,  respiratory rate 22, blood pressure 98/56, O2 saturation 93% on room air.  GENERAL:  She was in moderate distress actively vomiting, very sick-looking  woman.  HEENT:  Eyes; pupils equal, round, and reactive to light.  Extraocular  muscles intact.  Conjunctivae injected.  ENT; external thrush on palate and  tongue.  Condition of the oropharynx and tongue are erythematous.  LUNGS:  Poor air movement with decreased effort.  No wheezing, rhonchi.  HEART:  Tachycardic, regular tachycardic rhythm.  ABDOMEN:  Soft, nontender, with positive bowel sounds.  SKIN:  Hot and dry.  NEUROLOGY:  Nonfocal.   ADMISSION  LABORATORY DATA:  CBC with hemoglobin of 10.0, white count 0.6,  platelets 153.  Sodium 139, potassium less than 2 critical, chloride 105,  glucose 96, bicarbonate 22, creatinine 1.7 and later on 2.2.  AST elevated  at 47, ALT elevated at 61, and total protein 5.3, albumin of 2.3, calcium  7.0.  Lipase normal at 19.   Hepatitis panel negative.  UDS was positive for opiates and negative for  cocaine.   MICROBIOLOGY:  1.  Hepatitis panel negative.  CMV antibody, ELISA, IgM 0.58, within normal      limits.  2.  RPR nonreactive.  3.  Clostridium difficile negative x1 out of 1.  4.  Blood cultures x2 negative.  Blood culture work for fungus was negative      1 out of 1.  5.  Stool culture was negative.  Stool WBC's were none.  6.  Blood for AFB smear negative, culture pending.   OTHER LABS:  Urine pregnancy test negative, serum cortisol level normal at  15.4.  CK level on June 03, 2005, was 2157.  Discharge CK 1537.  Troponin  0.29, trended down to 0.17.  Fasting lipid profile within normal limits, LDL  57, HDL low at 24. EKG within normal limits.  Chest x-ray was negative.   HOSPITAL COURSE:  Problem 1.  Fever, nausea, vomiting, and diarrhea.  Etiology, although not completely clear, most likely diagnosis of __________  per ID consult was acute Bactrim allergic reaction could explain her  symptoms versus oral Candidiasis versus bacterial gastroenteritis.  Her  condition on initial presentation was very tenuous with hypotension and  refractory vomiting.  She was treated generously with IV fluids, treated  with Phenergan, and also Zofran for nausea and vomiting.  Blood cultures and  stool cultures were sent which showed a final negative report.  Chest x-ray  was clear.  Since she could not tolerate any p.o., she was placed on IV  fluconazole 200 mg daily per ID consult recommendations.  She was also  started on Vancomycin and Primaxin on admission given possibility of sepsis and  bacteremia which was later discontinued with negative blood cultures and  clinical improvement on supportive management and IV fluconazole.  PICC line  was also placed during this admission for IV fluids as fluid replacement and  to  monitor her volume status.  She did improve very well over time with  resolution of fever and nausea and vomiting.  She was able to tolerate p.o.  well at the time of discharge.   Problem 2.  Elevated cardiac enzymes.  Ms. Heagle had an episode of irregular  rhythm suggesting possible SVT versus ventricular tachycardia on a rhythm  strip which was later on discussed with the cardiologist and was found to be  an artifact.  During that period, her cardiac enzymes were also checked  which were elevated significantly with a CK level of 2157, CK-MB 63.3, and  troponin of 0.96.  For this reason, an official cardiology consult was done  and also she had some EKG changes of P wave flipped in anterior leads.  Dr.  Andee Lineman advised [place] regarding this matter and suggested that the EKG changes  were nonspecific, likely secondary to lead placement and her cardiac enzymes  were not significant in the setting of sepsis and also she was asymptomatic  without any chest pain which makes the cardiac origin unlikely.  She did  have a 2-D echocardiogram done which was within normal limits.  For this  reason, no further workup was done regarding this matter.   Problem 3.  Newly diagnosed HIV with a CD4 count of 90.  Ms. Testerman had a  recent screening test done for HIV at [facility] in February of 2007 and  was supposed to see Dr. Orvan Falconer in the ID Clinic for follow-up on June 18, 2005.  We did consult ID during this admission who suggested to hold off on  HART therapy while in the hospital with the plan of enrolling her in the  [employer] project for therapy.  We did start her on Dapsone for PCP prophylaxis  and discharge.  She has also been counseled and gotten some information   through__________  counseling program.  She will follow up with Dr. Orvan Falconer  in ID Clinic on April 10, at 11:30 a.m. for further management of this.  Laural Benes did approach the patient about participation in the study and the  patient did consider that.   Problem 4.  Electrolyte abnormalities, hypokalemia most likely secondary to  nausea, vomiting, and diarrhea and poor p.o. intake.  Shewas treated with  p.o. potassium and magnesium was also treated which was also low at 1. 4  which was repleted along with potassium and her discharge potassium remained  stable around 4.6.  This needs to be further checked at the time of hospital  follow-up.   Problem 5.  Hypomagnesemia.  Rechecked and resolved, most likely secondary  to nausea, vomiting, and diarrhea.   Problem 6.  Anemia.  Her iron studies showed iron level of 60, TIBC 253,  percent saturation 24, ferritin level 794, B12 level borderline at 292 which  might need further recheck at a later time.  She most likely has iron deficiency anemia with normal iron level and elevated ferritin level could  be acute reaction which needs to be further checked and evaluated.  Consider  starting her on iron therapy as an outpatient.   Problem 7.  Screening workup during hospitalization, her RPR was checked  which was negative.  Hepatitis panel was checked which was also negative.  She did have elevated liver function tests which were most likely thought to  be secondary to sepsis.   Last AST of 70, ALT of 46.  Clostridium difficile toxin was negative.  Blood  cultures x2 were negative and stool culture was negative.  Blood serum for  AFB smear was negative.  Culture is pending at the time of discharge.  Serum  for fungal was also negative.  CMV antibody, IgM, EIA was normal at 0.58.   DISCHARGE LABORATORY DATA:  CK 15,037, MB 39.1.  BMET; sodium 139, potassium  4.2, chloride 108, bicarb remained 25, BUN 6, creatinine 0.6.  CBC with  hemoglobin  of 12.4, WBC 1.9 which is increased from 0.6 on admission,  platelets 154.      Judie Grieve, MD    ______________________________  Ileana Roup, M.D.    SY/MEDQ  D:  06/14/2005  T:  06/14/2005  Job:  [id number]   cc:   Cliffton Asters, M.D.  Fax: [phone]   Learta Codding, M.D. [facility]  [address]  [address]  [place]  [place] [address]

## 2010-07-27 NOTE — Discharge Summary (Signed)
NAME:  CATRIONA, DILLENBECK              ACCOUNT NO.:  [id number]   MEDICAL RECORD NO.:  [id number]          PATIENT TYPE:  INP   LOCATION:  3004                         FACILITY:  [facility]   PHYSICIAN:  Madaline Guthrie, M.D.    DATE OF BIRTH:  04-01-1955   DATE OF ADMISSION:  06/22/2005  DATE OF DISCHARGE:  06/26/2005                                 DISCHARGE SUMMARY   DISCHARGE DIAGNOSES:  1.  Allergic reaction to Valtrex, (?) dapsone.  2.  Neutropenia.  3.  Urinary tract infection secondary to Escherichia coli.  4.  Human immunodeficiency virus.  5.  Hypokalemia.  6.  Anemia.  7.  Chest pain, atypical.   DISCHARGE MEDICATIONS:  1.  Allegra 180 mg one tablet p.o. daily x2 weeks.  2.  Pepcid 40 mg one tablet p.o. q.h.s. x2 weeks.  3.  Levaquin 715 mg 1/2 tablet p.o. daily x6 days.  4.  Prednisone 60 mg p.o. daily x3 days, then 40 mg p.o. daily x3 days, then      20 mg p.o. x3 days, than stop.  5.  Benadryl 12.5 mg 1-2 tablets p.o. q.6 h p.r.n. itching.  6.  Ibuprofen 400 mg p.o. q.6-8 h p.r.n. muscle pain.  (The patient was advised to stop her dapsone and her Valtrex until she sees  Dr. Orvan Falconer.)   CONDITION ON DISCHARGE:  The patient is much improved at the time of  discharge.  Her swelling and rash have resolved.  She will follow up with  Dr. Orvan Falconer at a previously-scheduled appointment on Jul 13, 2005.  At that  time, follow-up should include any further allergic reactions and  consideration of restarting any prophylactic medications, as well as  possible HAART therapy.  She should have a basic metabolic panel to evaluate  her potassium, as well.   PROCEDURES:  1.  On June 23, 2005 a chest x-ray showed no acute disease.  2.  On June 26, 2005,  a CT angio of the chest was negative for pulmonary      embolus.   CONSULTATIONS:  There were no consultations during this admission.   BRIEF ADMITTING HISTORY AND PHYSICAL:  Ms. Huyett is a 55 year old African-  American woman  recently diagnosed with HIV in February of 2007 who is  followed by Dr. Orvan Falconer in our infectious disease clinic.  She presents  with a 1-day history of a rash and mild shortness of breath secondary to  upper airway congestion.  She also noted some generalized swelling of her  lips and nausea and vomiting.  She has recently started taking Valtrex 1-2  days prior to admission and attributes her symptoms to this.  Notably, the  patient has a history of allergic reaction to Bactrim, for which she was  hospitalized earlier in the year.  She is also allergic to penicillin.   PHYSICAL EXAMINATION:  The next notable findings on physical exam included:  VITAL SIGNS:  Temperature of 99.6 to 102.1, blood pressure was stable at  121/63, pulse was 82, respirations 20.  She was 94% on room air.  GENERAL:  She was  in no acute distress.  Her mucous membranes were moist.  There was no swelling of her oropharynx.  Her upper lip did appear to be  swollen.  LUNGS:  Her lungs were clear.  There were no wheezes, no stridor.  HEART:  Regular rate and rhythm without murmurs.  ABDOMEN:  Soft, nontender, nondistended.  She had good bowel sounds.  SKIN:  She did have a generalized maculopapular rash on the bilateral upper  extremities, thighs, face and chest.  NEUROLOGIC:  Unremarkable.  PSYCHIATRIC:  Appropriate.   LABORATORY DATA:  Notable findings on admission labs include a white count  of 1.5, hemoglobin of 10.4, hematocrit 30.2, platelets of 168.  Her ANC was  0.4.  Initial potassium was 3.4.  For full details, please see admission  history and physical.   HOSPITAL COURSE:  1.  Allergic reactions.  The patient, as mentioned, came in with allergic      reaction 1-2 days after starting Valtrex for oral herpes.  She does have      a history of allergic reaction, severe, to Bactrim.  She had been on      dapsone for the past month and only noticed these new symptoms when      starting Valtrex.  In the  emergency room, she was treated with IV Solu-      Medrol and Benadryl.  She was initially continued on Benadryl and      famotidine, and all medications were held.  Her initial CBC did show 16%      eosinophils on her differential.  One day after admission, the patient      felt her swelling and her rash were unchanged.  At that time,      subcutaneous epinephrine was given and IV steroids were started.  She      was still continued on the standing Benadryl and famotidine.  She did      show improvement over the next 2 days with complete resolution of her      upper lip swelling and decreased erythema and pruritus of her rash.  Her      eosinophilia did resolve after treatment.  She was changed to p.o.      steroids the day of discharge.  She is being discharged on a prednisone      taper of 60 mg daily for 3 days and then a slow taper over 9 days.  She      will also take Allegra daily for 2 weeks and Pepcid daily for 2 weeks.      And she can continue to use Benadryl p.r.n. should she have any residual      symptoms.  She was told to stop her dapsone and her Valtrex.  2.  Neutropenia and fevers in an immunosuppressed patient.  The patient's      initial white blood cell count was 1.5 with an ANC of 0.4.  She was      initially placed on neutropenic precautions.  There was some thought      that this could be due to her medications, perhaps dapsone versus      opportunistic infection.  In a patient with HIV, pan cultures were      performed.  Blood cultures remain negative to date.  Her UA and urine      culture did show E-coli (please see problem number next).  After      stopping the dapsone, she had resolution of  her neutropenia.  Her white      blood cell count came up to 2.5 and her ANC was 1.3.  Again, dapsone was      held at the time of discharge.  3.  Urinary tract infection.  The patient did not have any urinary symptoms,     but as mentioned previously had a fever and was  neutropenic.  A UA did      show trace leukocytes with a few white cells on microscopy.  Cultures      showed 60,000 colony-forming units of E-coli.  Sensitivities showed      resistance to ampicillin and Bactrim.  There was intermediate resistance      to ciprofloxacin.  Therefore, the patient was started on Levaquin was      which was sensitive.  The plan was to continue her treatment for 1 week,      given her profound immunosuppression secondary to HIV and initial      fevers.  4.  Human immunodeficiency virus.  The patient has not been started on HAART      therapy.  She has had numerous allergic reactions to prophylactic      medications including Bactrim, and now perhaps dapsone, as well.  She      will follow up with Dr. Orvan Falconer at a previously-scheduled appointment      on Jul 13, 2005 to review other options for prophylactic medication, as      well as the possibility of starting HAART therapy.  5.  Hypokalemia.  The patient initially presented with hypokalemia.      Initially, this was felt to be secondary to her transient nausea and      vomiting secondary to her allergic reaction.  She did not continue to      have any nausea and vomiting, but her potassium remains low despite      repletion.  Her magnesium level was 2.0.  Again, the cause of her      hypokalemia is not clear at this time.  The last potassium that was      checked on the day of discharge showed a potassium of 3.  This was      repleted prior to discharge, and she will have a BMET checked within the      week for follow-up.  6.  Anemia.  She did have a low hemoglobin at the time of admission at 10.4.      This is down from her previous admission with a hemoglobin of 12.4.      With IV hydration, her hemoglobin dropped down to as low as 9.5.      Without any specific treatment and just stopping IV fluids, her      hemoglobin was back up to 11.1.  This is likely anemia of chronic      disease, and should she  continue to have problems, that may need further      workup.  Of note, labs performed at her prior hospitalization did      suggest anemia of chronic disease with an iron of 60 and ferritin of      794.  7.  Chest pain.  The patient had one episode of chest pain on the day of      discharge.  She describes it as right-sided and crampy chest pain.  Her      only DVT prophylaxis during her hospitalization had been PAS hose, and  so a D-dimer was ordered.  It was elevated at 1.15.  Cardiac panel and      EKG were negative.  Given her elevated D-dimer, she was sent for CT      angiogram of the chest to rule out pulmonary embolus.  There was no      evidence of pulmonary embolus.  However, there was a 5 mm calcified      nodule in the right upper lobe that will need follow-up.   DISCHARGE LABORATORIES AND VITALS:  On the day of discharge, vital signs  included a temperature of 97.8, blood pressure of 141/81, pulse 62, respirations 18.  She was 95-97% on room air.  Her last set of labs did show  a white blood cell count of 3.9, hemoglobin 11.1, hematocrit 33.0, platelets  of 236 and ANC was 1.5.  Her last BMET showed a sodium of 138, potassium 3.0  which was repleted with 40 mEq of K-Dur p.o. x2 prior to discharge.  Chloride was 104, bicarbonate 27, glucose 78, BUN 7, creatinine 0.6, calcium  of 8.8, D-dimer was 1.15.  Cardiac panel was negative.  Blood cultures on  June 23, 2005 were no growth to date.  Urine culture again showed 60,000  colonies/mL of E-coli, resistant to ampicillin, Bactrim and intermediate  resistance to ciprofloxacin.   There are no pending labs at the time of this dictation.  Again, she will  need follow-up on her anti-retrovirals and prophylactic medications.  She  will also need follow-up on her hypokalemia as far as etiology, and a follow-  up of her incidental lung nodule.      Inis Sizer, M.D.      Madaline Guthrie, M.D.  Electronically Signed     DC/MEDQ  D:  06/26/2005  T:  06/27/2005  Job:  [id number]   cc:   Dr. Orvan Falconer

## 2010-07-27 NOTE — Assessment & Plan Note (Signed)
[facility]                           GASTROENTEROLOGY OFFICE NOTE   NAME:[name] [name] [name]                     MRN:          [id number]  DATE:12/03/2005                            DOB:          1956-01-30    REASON FOR CONSULTATION:  Heme positive stool.   [name] [name] is a 55 year old African American female referred through the  courtesy of Dr. Orvan Falconer for evaluation.  Routine testing demonstrated heme  positive stool.  [name] [name] has no GI complaints including change of bowel  habits, abdominal pain, rectal pain, melena, or hematochezia.  She has a  history of GERD that was diagnosed almost ten years ago.  She is on no  gastric irritants including non-steroidals.  She denies pyrosis or abdominal  pain.   PAST MEDICAL HISTORY:  Pertinent for HIV positivity.  She had an ectopic  pregnancy in 1989.   FAMILY HISTORY:  Noncontributory.   MEDICATIONS:  Melvenia Needles, Gelata and Epzicom.   ALLERGIES:  SULFA.   HABITS:  She neither smokes nor drinks.   SOCIAL HISTORY:  She is married and unemployed.   REVIEW OF SYSTEMS:  Positive for joint pains, excessive thirst, night  sweats, fatigue, and some shortness of breath.   PHYSICAL EXAMINATION:  VITAL SIGNS:  Pulse 84, blood pressure 160/98, weight  174.  Exam was normal.  Rectal deferred.  HEENT:  EOMI. PERRLA. Sclerae are anicteric.  Conjunctivae are pink.  NECK:  Supple without thyromegaly, adenopathy or carotid bruits.  CHEST:  Clear to auscultation and percussion without adventitious sounds.  CARDIAC:  Regular rhythm; normal S1 S2.  There are no murmurs, gallops or  rubs.  ABDOMEN:  Bowel sounds are normoactive.  Abdomen is soft, non-tender and non-  distended.  There are no abdominal masses, tenderness, splenic enlargement  or hepatomegaly.  EXTREMITIES:  Full range of motion.  No cyanosis, clubbing or edema.  RECTAL:  Not done.   IMPRESSION:  Heme positive stool - rule out GI bleeding  sources including  polyps, AVM, tumor, and neoplasm.  An upper GI source is also possible  including peptic ulcer disease.   RECOMMENDATIONS:  1. Colonoscopy.  2. Serial hemoccults.  If positive, will proceed with endoscopy if the      colonoscopy is not diagnostic.                                   Barbette Hair. Arlyce Dice, MD,FACG   RDK/MedQ  DD:  12/03/2005  DT:  12/04/2005  Job #:  [id number]   cc:   Cliffton Asters, M.D.  Clement Husbands, M.D.

## 2010-07-27 NOTE — Group Therapy Note (Signed)
NAME:  [name] [name], [name] [name]                        ACCOUNT NO.:  [id number]   MEDICAL RECORD NO.:  [id number]                   PATIENT TYPE:  OUT   LOCATION:  [facility]                           FACILITY:  [facility]   PHYSICIAN:  Argentina Donovan, MD                     DATE OF BIRTH:  28-Mar-1955   DATE OF SERVICE:  02/22/2003                                    CLINIC NOTE   HISTORY OF PRESENT ILLNESS:  The patient is a 55 year old gravida 4, para 0-  0-4-0 who was sent in by [facility] because they could not visualize her  cervix.  External genitalia was normal.  BUS within normal limits.  The  vagina was clean and well rugated.  The cervix was way posterior and down  into the right side.  It is tiny and nulliparous.  Pap smear was taken.  The  uterus could be palpated bimanually anterior and is of normal size, shape,  consistency.  Adnexa could not be outlined because of habitus of patient.  In discussing with the patient she had her last period almost one year ago  and has been suffering with extreme vasomotor symptoms since that time.  She  has known blood pressure 142/87.  She is not on any medication and she is a  nonsmoker and would like some treatment.  We are going to cycle her on  Yasmin, try that as an attempt to ameliorate her symptoms.   IMPRESSION:  1. Normal pelvic examination.  2. Vasomotor symptomatology, severe.                                               Argentina Donovan, MD    PR/MEDQ  D:  02/22/2003  T:  02/22/2003  Job:  [id number]

## 2010-07-27 NOTE — Consult Note (Signed)
[facility]  Patient:    [name] [name], [name] [name] Visit Number: [id number] MRN: [id number]          Service Type: EMS Location: [facility] Attending Physician:  Armanda Heritage Dictated by:   Elinor Dodge, M.D. Proc. Date: 07/23/01 Admit Date:  06/22/2001 Discharge Date: 06/22/2001                            Consultation Report  HISTORY OF PRESENT ILLNESS:  The patient is a 55 year old gravida 4, para 0-0-4-0 with a history of one volunteer interruption of pregnancy, 2 spontaneous ABs, and one ectopic pregnancy resulting in a right salpingo-oophorectomy in 1992. The patient was seen in the [facility] Emergency Room one month ago because of severe disabling abdominal cramping, heavy vaginal bleeding, and secondary anemia. She had a myomectomy in the early 1990s, was always wanting to get pregnant, but now came in with the idea of undergoing hysterectomy. She becomes disabled each month because of the pain.  PHYSICAL EXAMINATION:  PELVIC:  The uterus is about the size of a 12-14 week pregnancy, irregular in configuration. Adnexa could not be well palpated. The external genitalia was normal. The introitus was marital, the vagina wall clean and well rugated with a nulliparous clean cervix. Pap smear was carried out.  We discussed the patient the options. We are going to try Ortho Evra for several months to see whether or not this controls her bleeding and pain and allows time to build up her hemoglobin. She is still taking iron every day. If there is no benefit, then we will go ahead and do a hysterectomy. A thought behind the others, if we can control this in this nonsmoking patient with no significant medical history and a normal blood pressure and bide time until she gets to menopause as she is already having some symptoms of hot flashes and sleep disturbances, that we may eventually find that the uterus shrinks down.  IMPRESSION: 1. Symptomatic leiomyomata  uteri with secondary anemia. 2. Menorrhagia with intermenstrual spotting. Dictated by:   Elinor Dodge, M.D. Attending Physician:  Armanda Heritage DD:  07/23/01 TD:  07/25/01 Job: [id number]

## 2010-08-08 ENCOUNTER — Other Ambulatory Visit: Payer: Self-pay | Admitting: *Deleted

## 2010-08-08 DIAGNOSIS — B20 Human immunodeficiency virus [HIV] disease: Secondary | ICD-10-CM

## 2010-08-08 MED ORDER — RITONAVIR 100 MG PO TABS: 100.0000 mg | ORAL_TABLET | Freq: Every day | ORAL | Status: DC

## 2010-08-08 MED ORDER — FLUCONAZOLE 100 MG PO TABS: 100.0000 mg | ORAL_TABLET | ORAL | Status: DC

## 2010-08-08 MED ORDER — TENOFOVIR DISOPROXIL FUMARATE 300 MG PO TABS: 300.0000 mg | ORAL_TABLET | Freq: Every day | ORAL | Status: DC

## 2010-08-08 MED ORDER — ABACAVIR SULFATE 300 MG PO TABS: 600.0000 mg | ORAL_TABLET | Freq: Every day | ORAL | Status: DC

## 2010-08-08 MED ORDER — ATAZANAVIR SULFATE 300 MG PO CAPS: 300.0000 mg | ORAL_CAPSULE | Freq: Every day | ORAL | Status: DC

## 2010-09-11 ENCOUNTER — Other Ambulatory Visit (INDEPENDENT_AMBULATORY_CARE_PROVIDER_SITE_OTHER): Payer: Self-pay

## 2010-09-11 ENCOUNTER — Other Ambulatory Visit: Payer: Self-pay | Admitting: Licensed Clinical Social Worker

## 2010-09-11 ENCOUNTER — Other Ambulatory Visit: Payer: Self-pay | Admitting: Internal Medicine

## 2010-09-11 DIAGNOSIS — B20 Human immunodeficiency virus [HIV] disease: Secondary | ICD-10-CM

## 2010-09-11 LAB — CBC WITH DIFFERENTIAL/PLATELET
Basophils Absolute: 0 10*3/uL (ref 0.0–0.1)
Basophils Relative: 0 % (ref 0–1)
Hemoglobin: 12 g/dL (ref 12.0–15.0)
MCHC: 32.7 g/dL (ref 30.0–36.0)
Monocytes Relative: 7 % (ref 3–12)
Neutro Abs: 3.2 10*3/uL (ref 1.7–7.7)
Neutrophils Relative %: 60 % (ref 43–77)
RDW: 14.3 % (ref 11.5–15.5)

## 2010-09-12 LAB — COMPLETE METABOLIC PANEL WITH GFR
ALT: 12 U/L (ref 0–35)
AST: 15 U/L (ref 0–37)
Albumin: 4.1 g/dL (ref 3.5–5.2)
Alkaline Phosphatase: 105 U/L (ref 39–117)
Potassium: 3.1 mEq/L — ABNORMAL LOW (ref 3.5–5.3)
Sodium: 139 mEq/L (ref 135–145)
Total Protein: 8.2 g/dL (ref 6.0–8.3)

## 2010-09-13 LAB — T-HELPER CELL (CD4) - (RCID CLINIC ONLY): CD4 % Helper T Cell: 15 % — ABNORMAL LOW (ref 33–55)

## 2010-09-17 ENCOUNTER — Ambulatory Visit (INDEPENDENT_AMBULATORY_CARE_PROVIDER_SITE_OTHER): Payer: Self-pay | Admitting: *Deleted

## 2010-09-17 VITALS — BP 131/70 | HR 83 | Temp 97.8°F | Wt 205.5 lb

## 2010-09-17 DIAGNOSIS — B20 Human immunodeficiency virus [HIV] disease: Secondary | ICD-10-CM

## 2010-09-17 LAB — LIPID PANEL
Cholesterol: 223 mg/dL — ABNORMAL HIGH (ref 0–200)
HDL: 51 mg/dL (ref >39)
Total CHOL/HDL Ratio: 4.4 Ratio

## 2010-09-17 NOTE — Progress Notes (Signed)
Patient here for week 272 ALLRT study visit. She  c/o numbness on the bottom of her left foot, which she feels is getting worse. Also, c/o of itchy rash on rectal area past 2 weeks. Encouraged her to restart her valtrex which she has at home. Has lost 15 pounds recently which she attributes to the heat. She has been very adherent with her meds lately. She will return in September for her next study visit. And she sees Dr. Orvan Falconer next week.

## 2010-09-25 ENCOUNTER — Ambulatory Visit: Payer: Self-pay

## 2010-09-25 ENCOUNTER — Encounter: Payer: Self-pay | Admitting: Internal Medicine

## 2010-09-25 ENCOUNTER — Ambulatory Visit (INDEPENDENT_AMBULATORY_CARE_PROVIDER_SITE_OTHER): Payer: Self-pay | Admitting: Internal Medicine

## 2010-09-25 DIAGNOSIS — B20 Human immunodeficiency virus [HIV] disease: Secondary | ICD-10-CM

## 2010-09-25 DIAGNOSIS — M1990 Unspecified osteoarthritis, unspecified site: Secondary | ICD-10-CM

## 2010-09-25 DIAGNOSIS — Z23 Encounter for immunization: Secondary | ICD-10-CM

## 2010-09-25 NOTE — Progress Notes (Signed)
  Subjective:    Patient ID: [name] [name], female    DOB: [birth year], 55 y.o.   MRN: [id number]  HPI [name] [name] is in for her routine visit. She is only missed one dose of her medications since her last visit. That occurred when she was out of town visiting friends and forgot to take her dose of Reyataz. She did note to not take her other antiretroviral medications. She is feeling well except for intermittent left foot pain. It is worse when she walks long distances or stands on her feet for a long period of time. She has taken Naprosyn but generally only uses it when the pain is severe. It does help.  She says that she is hoping to move to [place], [place] later this year to be closer to her family.    Review of Systems     Objective:   Physical Exam  Constitutional: No distress.  HENT:  Mouth/Throat: Oropharynx is clear and moist. No oropharyngeal exudate.  Cardiovascular: Normal heart sounds.   Pulmonary/Chest: Breath sounds normal. She has no wheezes. She has no rales.  Musculoskeletal:       She has some tenderness with palpation over her distal left foot at the base of the metatarsals. She has a hallux valgus deformity of her great toe. She has normal pulses and intact sensation.  Psychiatric: She has a normal mood and affect.       She is tearful when talking about her pending move to [place], stating that she has very little support here from her in-laws.          Assessment & Plan:

## 2010-09-25 NOTE — Assessment & Plan Note (Signed)
Her infection has come under much better control since restarting medications last year. Her viral load is now undetectable less than 20 and her CD4 count has returned to normal. I've asked her to talk to her case manager to help with her transition to care in [place].

## 2010-09-25 NOTE — Progress Notes (Signed)
Addended by: Wendall Mola A on: 09/25/2010 10:57 AM   Modules accepted: Orders

## 2010-10-16 ENCOUNTER — Telehealth: Payer: Self-pay | Admitting: *Deleted

## 2010-10-16 ENCOUNTER — Ambulatory Visit (INDEPENDENT_AMBULATORY_CARE_PROVIDER_SITE_OTHER): Payer: Self-pay | Admitting: *Deleted

## 2010-10-16 VITALS — BP 120/80 | HR 71 | Temp 98.3°F | Wt 208.5 lb

## 2010-10-16 DIAGNOSIS — B009 Herpesviral infection, unspecified: Secondary | ICD-10-CM

## 2010-10-16 MED ORDER — VALACYCLOVIR HCL 500 MG PO TABS: 500.0000 mg | ORAL_TABLET | Freq: Two times a day (BID) | ORAL | Status: DC

## 2010-10-16 NOTE — Telephone Encounter (Signed)
Called c/o blisters in her mouth and on her lip that are painful. Wants something for them. I talked with Dr. Orvan Falconer who wants to do a herpes culture on them and then treat her with Valtrex 500mg  1 BID. She is going to come in at 2pm today for [place] to look at them.

## 2010-10-16 NOTE — Progress Notes (Signed)
Patient has a swollen, red upper rt lip with notable blisters. She reports it as very painful, when it started breaking out yesterday, she said it was tingly like a fever blister. She also has some vesicles on the upper inner lt lip that are tender. Culture was obtained for herpes. I instructed her to begin valtrex 500mg  BID for 7 days. She said she still had valtrex leftover and would start taking those today. I told her to call back if she didn't see any improvement in a day or 2.

## 2010-10-23 LAB — HERPES SIMPLEX VIRUS CULTURE: Organism ID, Bacteria: NOT DETECTED

## 2010-11-23 ENCOUNTER — Ambulatory Visit (INDEPENDENT_AMBULATORY_CARE_PROVIDER_SITE_OTHER): Payer: Self-pay | Admitting: *Deleted

## 2010-11-23 ENCOUNTER — Encounter: Payer: Self-pay | Admitting: *Deleted

## 2010-11-23 VITALS — BP 136/80 | HR 85 | Temp 98.2°F | Ht 61.0 in | Wt 203.2 lb

## 2010-11-23 DIAGNOSIS — B20 Human immunodeficiency virus [HIV] disease: Secondary | ICD-10-CM

## 2010-11-23 LAB — LIPID PANEL
Cholesterol: 201 mg/dL — ABNORMAL HIGH (ref 0–200)
HDL: 42 mg/dL (ref >39)
Triglycerides: 84 mg/dL (ref <150)

## 2010-11-23 LAB — COMPREHENSIVE METABOLIC PANEL
AST: 16 U/L (ref 0–37)
Albumin: 4.1 g/dL (ref 3.5–5.2)
BUN: 7 mg/dL (ref 6–23)
Calcium: 9.4 mg/dL (ref 8.4–10.5)
Chloride: 99 mEq/L (ref 96–112)
Glucose, Bld: 79 mg/dL (ref 70–99)
Potassium: 3.1 mEq/L — ABNORMAL LOW (ref 3.5–5.3)

## 2010-11-23 LAB — HEPATITIS B SURFACE ANTIGEN: Hepatitis B Surface Ag: NEGATIVE

## 2010-11-23 LAB — HEPATITIS C ANTIBODY: HCV Ab: NEGATIVE

## 2010-11-23 NOTE — Progress Notes (Signed)
Patient here for week 288 ALLRT study visit. She says she has been very adherent with her meds and seems in good spirits. She has been going to [employer] almost everyday and enjoys the companionship and support of the staff. She still has some left foot pain and diminished sensation in that foot. She has an occassional pain/ache in her left wrist and hand. She says that recently she has noticed her short term memory is not as sharp as it had been and that she has some trouble focusing on things she needs to do. She has not had any breakouts with either fever blisters or genital herpes lately and knows that she needs to start taking her valtrex as soon as it begins.She is scheduled to see dr. Orvan Falconer in the next few months.

## 2010-12-05 LAB — DIFFERENTIAL
Basophils Absolute: 0
Lymphocytes Relative: 12
Lymphs Abs: 0.7
Monocytes Absolute: 0.4
Neutro Abs: 4.7

## 2010-12-05 LAB — URINALYSIS, ROUTINE W REFLEX MICROSCOPIC
Bilirubin Urine: NEGATIVE
Nitrite: NEGATIVE
Specific Gravity, Urine: 1.011
pH: 5.5

## 2010-12-05 LAB — POCT I-STAT, CHEM 8
BUN: 14
Calcium, Ion: 1.12
Creatinine, Ser: 1
TCO2: 27

## 2010-12-05 LAB — CBC
Hemoglobin: 12.6
RBC: 4.07
RDW: 13.3
WBC: 5.9

## 2010-12-05 LAB — RAPID URINE DRUG SCREEN, HOSP PERFORMED
Cocaine: NOT DETECTED
Opiates: NOT DETECTED
Tetrahydrocannabinol: NOT DETECTED

## 2010-12-10 LAB — URINALYSIS, ROUTINE W REFLEX MICROSCOPIC
Glucose, UA: NEGATIVE
pH: 7.5

## 2010-12-10 LAB — URINE MICROSCOPIC-ADD ON

## 2010-12-13 ENCOUNTER — Encounter: Payer: Self-pay | Admitting: Internal Medicine

## 2010-12-13 LAB — CD4/CD8 (T-HELPER/T-SUPPRESSOR CELL): CD8 % Suppressor T Cell: 45.9

## 2010-12-24 LAB — DIFFERENTIAL
Basophils Relative: 0
Lymphs Abs: 1.2
Monocytes Absolute: 0.2
Monocytes Relative: 6
Neutro Abs: 1.8

## 2010-12-24 LAB — CBC
Hemoglobin: 12.9
MCHC: 33.9
MCV: 92.6
RBC: 4.09

## 2010-12-24 LAB — POCT I-STAT CREATININE
Creatinine, Ser: 0.8
Operator id: 196461

## 2010-12-24 LAB — I-STAT 8, (EC8 V) (CONVERTED LAB)
BUN: 5 — ABNORMAL LOW
Glucose, Bld: 103 — ABNORMAL HIGH
Potassium: 3.6
TCO2: 26
pH, Ven: 7.439 — ABNORMAL HIGH

## 2011-02-01 ENCOUNTER — Encounter (HOSPITAL_COMMUNITY): Payer: Self-pay | Admitting: *Deleted

## 2011-02-01 ENCOUNTER — Emergency Department (HOSPITAL_COMMUNITY)
Admission: EM | Admit: 2011-02-01 | Discharge: 2011-02-01 | Discharge status: Against Medical Advice | Payer: Self-pay | Attending: Emergency Medicine | Admitting: Emergency Medicine

## 2011-02-01 DIAGNOSIS — R109 Unspecified abdominal pain: Secondary | ICD-10-CM | POA: Insufficient documentation

## 2011-02-01 HISTORY — DX: Human immunodeficiency virus (HIV) disease: B20

## 2011-02-01 HISTORY — DX: Essential (primary) hypertension: I10

## 2011-02-01 NOTE — ED Notes (Signed)
Patient reports burning and chest pain when she eats.  She also reports burning pain in her stomach.  She denies any other chest pain.  Patient reports nausea

## 2011-02-01 NOTE — ED Notes (Signed)
No answer when called for room 

## 2011-02-10 ENCOUNTER — Encounter (HOSPITAL_COMMUNITY): Payer: Self-pay | Admitting: *Deleted

## 2011-02-10 ENCOUNTER — Emergency Department (HOSPITAL_COMMUNITY)
Admission: EM | Admit: 2011-02-10 | Discharge: 2011-02-10 | Disposition: A | Discharge status: Home / Self Care | Payer: Self-pay | Attending: Emergency Medicine | Admitting: Emergency Medicine

## 2011-02-10 ENCOUNTER — Emergency Department (HOSPITAL_COMMUNITY): Payer: Self-pay

## 2011-02-10 DIAGNOSIS — R197 Diarrhea, unspecified: Secondary | ICD-10-CM | POA: Insufficient documentation

## 2011-02-10 DIAGNOSIS — R3915 Urgency of urination: Secondary | ICD-10-CM | POA: Insufficient documentation

## 2011-02-10 DIAGNOSIS — Z79899 Other long term (current) drug therapy: Secondary | ICD-10-CM | POA: Insufficient documentation

## 2011-02-10 DIAGNOSIS — Z21 Asymptomatic human immunodeficiency virus [HIV] infection status: Secondary | ICD-10-CM | POA: Insufficient documentation

## 2011-02-10 DIAGNOSIS — E876 Hypokalemia: Secondary | ICD-10-CM | POA: Insufficient documentation

## 2011-02-10 DIAGNOSIS — R350 Frequency of micturition: Secondary | ICD-10-CM | POA: Insufficient documentation

## 2011-02-10 DIAGNOSIS — D72819 Decreased white blood cell count, unspecified: Secondary | ICD-10-CM | POA: Insufficient documentation

## 2011-02-10 DIAGNOSIS — R110 Nausea: Secondary | ICD-10-CM | POA: Insufficient documentation

## 2011-02-10 DIAGNOSIS — I10 Essential (primary) hypertension: Secondary | ICD-10-CM | POA: Insufficient documentation

## 2011-02-10 DIAGNOSIS — R630 Anorexia: Secondary | ICD-10-CM | POA: Insufficient documentation

## 2011-02-10 DIAGNOSIS — R5381 Other malaise: Secondary | ICD-10-CM | POA: Insufficient documentation

## 2011-02-10 HISTORY — DX: Gastro-esophageal reflux disease without esophagitis: K21.9

## 2011-02-10 LAB — COMPREHENSIVE METABOLIC PANEL
ALT: 17 U/L (ref 0–35)
AST: 23 U/L (ref 0–37)
Alkaline Phosphatase: 86 U/L (ref 39–117)
CO2: 31 mEq/L (ref 19–32)
Chloride: 97 mEq/L (ref 96–112)
GFR calc Af Amer: >90 mL/min (ref >90)
GFR calc non Af Amer: >90 mL/min (ref >90)
Glucose, Bld: 95 mg/dL (ref 70–99)
Potassium: 2.2 mEq/L — CL (ref 3.5–5.1)
Sodium: 138 mEq/L (ref 135–145)

## 2011-02-10 LAB — URINALYSIS, ROUTINE W REFLEX MICROSCOPIC
Bilirubin Urine: NEGATIVE
Ketones, ur: NEGATIVE mg/dL
Nitrite: NEGATIVE
Specific Gravity, Urine: 1.012 (ref 1.005–1.030)
Urobilinogen, UA: 1 mg/dL (ref 0.0–1.0)
pH: 6.5 (ref 5.0–8.0)

## 2011-02-10 LAB — URINE MICROSCOPIC-ADD ON

## 2011-02-10 LAB — DIFFERENTIAL
Basophils Absolute: 0 10*3/uL (ref 0.0–0.1)
Basophils Relative: 2 % — ABNORMAL HIGH (ref 0–1)
Eosinophils Absolute: 0 10*3/uL (ref 0.0–0.7)
Eosinophils Relative: 2 % (ref 0–5)
Lymphocytes Relative: 32 % (ref 12–46)
Lymphs Abs: 0.7 10*3/uL (ref 0.7–4.0)
Monocytes Absolute: 0.7 10*3/uL (ref 0.1–1.0)
Monocytes Relative: 35 % — ABNORMAL HIGH (ref 3–12)
Neutro Abs: 0.6 10*3/uL — ABNORMAL LOW (ref 1.7–7.7)
Neutrophils Relative %: 29 % — ABNORMAL LOW (ref 43–77)

## 2011-02-10 LAB — CBC
MCV: 88 fL (ref 78.0–100.0)
Platelets: 168 10*3/uL (ref 150–400)
RBC: 3.91 MIL/uL (ref 3.87–5.11)
RDW: 14.2 % (ref 11.5–15.5)
WBC: 2 10*3/uL — ABNORMAL LOW (ref 4.0–10.5)

## 2011-02-10 LAB — MAGNESIUM: Magnesium: 2.1 mg/dL (ref 1.5–2.5)

## 2011-02-10 MED ORDER — SODIUM CHLORIDE 0.9 % IV BOLUS (SEPSIS)
1000.0000 mL | Freq: Once | INTRAVENOUS | Status: AC
  Administered 2011-02-10: 1000 mL via INTRAVENOUS

## 2011-02-10 MED ORDER — SODIUM CHLORIDE 0.9 % IV SOLN
Freq: Once | INTRAVENOUS | Status: AC
  Administered 2011-02-10: 17:00:00 via INTRAVENOUS

## 2011-02-10 MED ORDER — POTASSIUM CHLORIDE 20 MEQ/15ML (10%) PO LIQD: 20.0000 meq | Freq: Two times a day (BID) | ORAL | Status: DC

## 2011-02-10 MED ORDER — POTASSIUM CHLORIDE 10 MEQ/100ML IV SOLN
10.0000 meq | Freq: Once | INTRAVENOUS | Status: AC
  Administered 2011-02-10: 10 meq via INTRAVENOUS
  Filled 2011-02-10: qty 100

## 2011-02-10 MED ORDER — POTASSIUM CHLORIDE 20 MEQ/15ML (10%) PO LIQD
ORAL | Status: AC
  Administered 2011-02-10: 20 meq
  Filled 2011-02-10: qty 15

## 2011-02-10 MED ORDER — POTASSIUM CHLORIDE 20 MEQ/15ML (10%) PO LIQD
20.0000 meq | Freq: Every day | ORAL | Status: DC
Start: 2011-02-10 — End: 2011-02-10

## 2011-02-10 MED ORDER — POTASSIUM CHLORIDE 10 MEQ/100ML IV SOLN
10.0000 meq | INTRAVENOUS | Status: AC
  Administered 2011-02-10 (×2): 10 meq via INTRAVENOUS
  Filled 2011-02-10 (×2): qty 100

## 2011-02-10 MED ORDER — ONDANSETRON 4 MG PO TBDP: 8.0000 mg | ORAL_TABLET | Freq: Once | ORAL | Status: DC

## 2011-02-10 MED ORDER — POTASSIUM CHLORIDE CRYS ER 20 MEQ PO TBCR: 40.0000 meq | EXTENDED_RELEASE_TABLET | Freq: Once | ORAL | Status: DC

## 2011-02-10 NOTE — ED Notes (Signed)
Reports having body aches, chills, diarrhea, nausea since Friday night.

## 2011-02-10 NOTE — ED Notes (Signed)
Pt has not had any stools since arrived to ED.

## 2011-02-10 NOTE — ED Provider Notes (Signed)
I saw the pt primarily and then she was tranferred to the CDU for further care by the PA.  Gwyneth Sprout, MD 02/10/11 [id number]

## 2011-02-10 NOTE — ED Notes (Signed)
Discharge inst given prescriptions given  voived understanding

## 2011-02-10 NOTE — ED Notes (Signed)
Spoke w/Joe, Radiology, to inquire re: CXR results - advised will look into it.

## 2011-02-10 NOTE — ED Notes (Signed)
No further bouts of diahrrea at this time  Amb to BR without difficulty

## 2011-02-10 NOTE — Consult Note (Signed)
INTERNAL MEDICINE TEACHING SERVICE ER EVALUATION NOTE   Date: 02/10/2011  Patient name:  [name] [name]  Medical record number:  [id number] Date of birth:  31-Oct-1955  Age: 55 y.o. Gender:  female PCP:   Cliffton Asters, MD, MD    Chief Complaint: diarrhea  History of Present Illness: Patient is a 55 y.o. female with a PMHx of  HIV (CD4 count of > 200), GERD, HTN who presented to [facility] for evaluation of chills with nausea x 2 days. Also had an episode of nonbloody, watery, small-volume diarrhea yesterday x 1 episode. She was concerned about these symptoms, and therefore, presented to [facility] for evaluation. She notably has continued her HCTZ yesterday and day of presentation. She also has decreased oral fluid intake.She otherwise denies a history or current symptom of the following: fevers, abdominal pain, vomiting, cough, sore throat, dysuria, hematuria, blood in stools, palpitations, chest pain, irregular heart beat. On ED evaluation, pt was found to be significantly hypokalemic with K of 2.2, however, her baseline is 3.1-3.3 chronically (for which she states she is recommended to continue eating more potassium supplements).   At time of ER evaluation, pt is feeling comfortable, is not nauseous, diarrhea has not returned, remains without chest pain, palpitations, irregular heart beat.    Current Outpatient Medications: Medication Sig Dispense Refill  . abacavir (ZIAGEN) 300 MG tablet Take 300 mg by mouth daily.       Marland Kitchen atazanavir (REYATAZ) 300 MG capsule Take 1 capsule (300 mg total) by mouth daily.  30 capsule  11  . fluconazole (DIFLUCAN) 100 MG tablet Take 100 mg by mouth once a week. mondays       . hydrochlorothiazide 25 MG tablet Take 1 tablet (25 mg total) by mouth daily.  90 tablet  3  . ritonavir (NORVIR) 100 MG TABS Take 1 tablet (100 mg total) by mouth daily.  30 tablet  11  . tenofovir (VIREAD) 300 MG tablet Take 1 tablet (300 mg total) by mouth daily.  30 tablet  11  .  diphenhydrAMINE (BENADRYL) 25 MG tablet Take 25 mg by mouth 4 (four) times daily as needed. For allergies      . naproxen sodium (ANAPROX) 220 MG tablet Take 220 mg by mouth 2 (two) times daily as needed. For pain         Allergies: Sulfa antibiotics and Penicillins  Past Medical History: Past Medical History  Diagnosis Date  . HIV (human immunodeficiency virus infection) 2007  . Hypertension   . GERD (gastroesophageal reflux disease)     Past Surgical History: Past Surgical History  Procedure Date  . Ectopic pregnancy surgery     s/p right salpinooophrectomy  . Bunionectomy   . Uterine fibroid surgery     Family History: Family History  Problem Relation Age of Onset  . Coronary artery disease Mother   . Hypertension Mother   . Anxiety disorder Mother   . Arthritis Mother     rheumatoid  . Cancer Father     possible prostate cancer  . Diabetes Sister     Social History: History   Social History  . Marital Status: Married    Spouse Name: N/A    Number of Children: 0  . Years of Education: 40 th grad   Occupational History  . distability     since 2010 for 042 status   Social History Main Topics  . Smoking status: Never Smoker   . Smokeless tobacco: Never Used  .  Alcohol Use: No  . Drug Use: No     Former cocaine abuse, quit in 2000  . Sexually Active: No     declined condoms   Other Topics Concern  . Not on file   Social History Narrative  . No narrative on file    Review of Systems: Pertinent items are noted in HPI.  Vital Signs: BP 113/66  Pulse 68  Temp(Src) 98.9 F (37.2 C) (Oral)  Resp 18  Ht 5\' 1"  (1.549 m)  Wt 197 lb (89.359 kg)  BMI 37.22 kg/m2  SpO2 100%   Physical Exam: General: Vital signs reviewed and noted. Well-developed, well-nourished, in no acute distress; alert, appropriate and cooperative throughout examination.  Head: Normocephalic, atraumatic.  Eyes: PERRL, EOMI, No signs of anemia or jaundince.  Nose: Mucous  membranes moist, not inflammed, nonerythematous.  Throat: Oropharynx nonerythematous, no exudate appreciated.   Neck: No deformities, masses, or tenderness noted.Supple, No carotid Bruits, no JVD.  Lungs:  Normal respiratory effort. Clear to auscultation BL without crackles or wheezes.  Heart: RRR. S1 and S2 normal without gallop, murmur, or rubs.  Abdomen:  BS normoactive. Soft, Nondistended, non-tender.  No masses or organomegaly.  Extremities: No pretibial edema.  Neurologic: A&O X3, CN II - XII are grossly intact. Motor strength is 5/5 in the all 4 extremities, Sensations intact to light touch, Cerebellar signs negative.  Skin: No visible rashes, scars.    Results:  Results for orders placed during the hospital encounter of 02/10/11 (from the past 24 hour(s))  CBC     Status: Abnormal   Collection Time   02/10/11 11:47 AM      Component Value Range   WBC 2.0 (*) 4.0 - 10.5 (K/uL)   RBC 3.91  3.87 - 5.11 (MIL/uL)   Hemoglobin 11.1 (*) 12.0 - 15.0 (g/dL)   HCT 16.1 (*) 09.6 - 46.0 (%)   MCV 88.0  78.0 - 100.0 (fL)   MCH 28.4  26.0 - 34.0 (pg)   MCHC 32.3  30.0 - 36.0 (g/dL)   RDW 04.5  40.9 - 81.1 (%)   Platelets 168  150 - 400 (K/uL)  DIFFERENTIAL     Status: Abnormal   Collection Time   02/10/11 11:47 AM      Component Value Range   Neutrophils Relative 29 (*) 43 - 77 (%)   Neutro Abs 0.6 (*) 1.7 - 7.7 (K/uL)   Lymphocytes Relative 32  12 - 46 (%)   Lymphs Abs 0.7  0.7 - 4.0 (K/uL)   Monocytes Relative 35 (*) 3 - 12 (%)   Monocytes Absolute 0.7  0.1 - 1.0 (K/uL)   Eosinophils Relative 2  0 - 5 (%)   Eosinophils Absolute 0.0  0.0 - 0.7 (K/uL)   Basophils Relative 2 (*) 0 - 1 (%)   Basophils Absolute 0.0  0.0 - 0.1 (K/uL)  COMPREHENSIVE METABOLIC PANEL     Status: Abnormal   Collection Time   02/10/11 11:47 AM      Component Value Range   Sodium 138  135 - 145 (mEq/L)   Potassium 2.2 (*) 3.5 - 5.1 (mEq/L)   Chloride 97  96 - 112 (mEq/L)   CO2 31  19 - 32 (mEq/L)    Glucose, Bld 95  70 - 99 (mg/dL)   BUN 9  6 - 23 (mg/dL)   Creatinine, Ser 9.14  0.50 - 1.10 (mg/dL)   Calcium 8.8  8.4 - 78.2 (mg/dL)   Total Protein  8.1  6.0 - 8.3 (g/dL)   Albumin 3.1 (*) 3.5 - 5.2 (g/dL)   AST 23  0 - 37 (U/L)   ALT 17  0 - 35 (U/L)   Alkaline Phosphatase 86  39 - 117 (U/L)   Total Bilirubin 1.9 (*) 0.3 - 1.2 (mg/dL)   GFR calc non Af Amer >90  >90 (mL/min)   GFR calc Af Amer >90  >90 (mL/min)  MAGNESIUM     Status: Normal   Collection Time   02/10/11  1:05 PM      Component Value Range   Magnesium 2.1  1.5 - 2.5 (mg/dL)  URINALYSIS, ROUTINE W REFLEX MICROSCOPIC     Status: Abnormal   Collection Time   02/10/11  2:11 PM      Component Value Range   Color, Urine YELLOW  YELLOW    APPearance CLEAR  CLEAR    Specific Gravity, Urine 1.012  1.005 - 1.030    pH 6.5  5.0 - 8.0    Glucose, UA NEGATIVE  NEGATIVE (mg/dL)   Hgb urine dipstick TRACE (*) NEGATIVE    Bilirubin Urine NEGATIVE  NEGATIVE    Ketones, ur NEGATIVE  NEGATIVE (mg/dL)   Protein, ur NEGATIVE  NEGATIVE (mg/dL)   Urobilinogen, UA 1.0  0.0 - 1.0 (mg/dL)   Nitrite NEGATIVE  NEGATIVE    Leukocytes, UA NEGATIVE  NEGATIVE   URINE MICROSCOPIC-ADD ON     Status: Normal   Collection Time   02/10/11  2:11 PM      Component Value Range   Squamous Epithelial / LPF RARE  RARE    Bacteria, UA RARE  RARE      Assessment & Plan:  Assessment: Patient is a 55 y.o. female with a PMHx of has a past medical history of HIV, GERD, HTN, chronic hypokalemia who presented to [facility] for evaluation of diarrhea, chills. On ER eval she is found to have significant hypokalemia to 2.2, for which she is being repleted by ER physician.  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 Likely hypokalemia today in setting of baseline hypokalemia worsened due to continued diuretic usage, diarrhea, decreased oral intake.  - Would  recommend replete K in ED. - Consider checking EKG - to be determined by ER physician. - Recheck BMET and potassium levels specifically before discharge.  - Mg ok. - Would recommend holding her HCTZ on discharge from ER.  2) Chills - possible viral illness given her diarrhea, although seems to have been self-limited. Despite her chronic neutropenia, pt has no other infectious cause apparent at this time with negative UA and CXR. Despite her HIV status, she is actually well controlled of her HIV with CD4 count > 200, and VL < 40 most recently. - Close follow-up with ID clinic, who is currently managing her HIV and her primary care services.   Plan:  1. Discharge patient to home. 2. Recommend close ID clinic follow-up within 1-2 days. 3. Recommend return to the ER if symptoms worsen or new symptoms arise. 4. Recommend continue all medications as prescribed.     Margorie John, M.D., PGY1 Johnette Abraham, D.O., PGY2

## 2011-02-10 NOTE — ED Provider Notes (Signed)
12:35 PM Patient care resumed from Dr. Anitra Lauth.  Patient presented to the emergency department with diarrhea.  Of note the patient is HIV positive and states that her last CD4 count by Dr. Orvan Falconer (ID) was in the 230s last September.  Per Dr. Anitra Lauth plan we will likely admit patient after labs had been received.  I have also sent for a stool culture and have instructed patient to notify nurse when she is to use the restroom.  Patient has been reevaluated. NAD, vital signs reviewed- hemodynamically stable, RRR, lungs clear to auscultation bilaterally, no abnormal pain or tenderness.  Patient denies occult blood in stool.  12:50 PM Notified from ladder about patient's low potassium.  Magnesium level was ordered as well as 10 IV potassium x2 and 40 by mouth.  Lab results were discussed with patient.  X-rays still pending.  2:32 PM Another L bolus of fluids given. Pt states she is not in any pain and has no complaint. Likely admittance was discussed with pt due to her hypokalemia, and low WBC (2.0). Will page internal medicine as soon as CXR results   3:42 PM  Internal medicine paged to admit the pt for hypokalemia, dehydration & leukopenia Labs listed below.   Pt does not have a PCP, ID doctor is Dr. Archer Asa --------------------------------------------------------------------------------------------------------------------------------   CHEST - 2 VIEW  Comparison: None  Findings: Heart and mediastinal contours are within normal limits. No focal opacities or effusions. No acute bony abnormality.  IMPRESSION: No active cardiopulmonary disease.  Component     Latest Ref Rng 02/10/2011  Sodium     135 - 145 mEq/L 138  Potassium     3.5 - 5.1 mEq/L 2.2 (LL)  Albumin     3.5 - 5.2 g/dL 3.1 (L)  Total Bilirubin     0.3 - 1.2 mg/dL 1.9 (H)  GFR calc non Af Amer     >90 mL/min >90  GFR calc Af Amer     >90 mL/min >90  Neutrophils Relative     43 - 77 % 29 (L)  Neutrophils  Absolute     1.7 - 7.7 K/uL 0.6 (L)  Lymphocytes Relative     12 - 46 % 32  Lymphocytes Absolute     0.7 - 4.0 K/uL 0.7  Monocytes Relative     3 - 12 % 35 (H)  Monocytes Absolute     0.1 - 1.0 K/uL 0.7  Eosinophils Relative     0 - 5 % 2  Eosinophils Absolute     0.0 - 0.7 K/uL 0.0  Basophils Relative     0 - 1 % 2 (H)  Basophils Absolute     0.0 - 0.1 K/uL 0.0  WBC     4.0 - 10.5 K/uL 2.0 (L)  RBC     3.87 - 5.11 MIL/uL 3.91  HGB     12.0 - 15.0 g/dL 40.9 (L)  HCT     81.1 - 46.0 % 34.4 (L)  Magnesium     1.5 - 2.5 mg/dL 2.1  9:14 PM Spoke with Dr. Saralyn Pilar from teaching service who will come see pt in the ED     [place], [place] 02/10/11 1607

## 2011-02-10 NOTE — ED Provider Notes (Signed)
06:00 PM I have spoken with the internal medicine resident regarding admission on this patient. The patient typically has a potassium level of about 3.1. The resident informs me that although the patient was advised to discontinue her diuretic, she has continued to take it. Given the diarrhea in combination with her diuretic use, the hypokalemia demonstrated on initial laboratory studies is to be expected. In addition, the patient has a baseline neutropenia in the past. After potassium repletion in the emergency department, her potassium was checked and found to be 2.8. I will order an additional IV dose of potassium chloride, 10 mEq. This should bring the patient closer to her baseline and we can plan for a discharge home rather than admission. The patient does have primary care with the infectious disease clinic; the internal medicine resident to evaluate the patient in the emergency department has reported to me that she will arrange followup for this patient with her regular doctor. I spoke with the patient regarding this plan and she is in agreement.  8:34 PM The patient's potassium dose has been completed. She feels ready for discharge. We have again discussed the plan for her to followup with her clinic doctor in the next couple of days or return to the emergency department for any worsening symptoms. I will have her take an oral potassium supplement for the next 2 days to continue repleting her supply.  Elwyn Reach [place], [place] 02/10/11 2034

## 2011-02-10 NOTE — ED Notes (Signed)
Patient resting at this time.  States her pain remains at a 6.  Potassium infusion completed

## 2011-02-10 NOTE — ED Provider Notes (Signed)
I saw pt initially and then she was moved to the CDU for further care.  Gwyneth Sprout, MD 02/10/11 1950

## 2011-02-10 NOTE — ED Provider Notes (Signed)
History     CSN: [id number] Arrival date & time: 02/10/2011 11:22 AM   First MD Initiated Contact with Patient 02/10/11 1141      Chief Complaint  Patient presents with  . Diarrhea  . Chills    (Consider location/radiation/quality/duration/timing/severity/associated sxs/prior treatment) Patient is a 55 y.o. female presenting with diarrhea. The history is provided by the patient.  Diarrhea The primary symptoms include fatigue, nausea and diarrhea. Primary symptoms do not include fever, abdominal pain, vomiting, dysuria or rash. The illness began 2 days ago. The onset was gradual. The problem has been gradually worsening.  The diarrhea began 2 days ago. The diarrhea is watery (Nonbloody). The diarrhea occurs 2 to 4 times per day. Risk factors: No recent antibiotics, new medications, or bad food exposure.  The illness is also significant for chills and anorexia. The illness does not include bloating, constipation, tenesmus or back pain. Associated medical issues comments: HIV. Risk factors: None.    Past Medical History  Diagnosis Date  . HIV (human immunodeficiency virus infection)   . Hypertension     Past Surgical History  Procedure Date  . Ectopic pregnancy surgery   . Bunionectomy   . Oophorectomy   . Abdominal surgery     History reviewed. No pertinent family history.  History  Substance Use Topics  . Smoking status: Never Smoker   . Smokeless tobacco: Never Used  . Alcohol Use: No    OB History    Grav Para Term Preterm Abortions TAB SAB Ect Mult Living                  Review of Systems  Constitutional: Positive for chills and fatigue. Negative for fever.  Gastrointestinal: Positive for nausea, diarrhea and anorexia. Negative for vomiting, abdominal pain, constipation and bloating.  Genitourinary: Positive for urgency and frequency. Negative for dysuria, vaginal bleeding and vaginal discharge.  Musculoskeletal: Negative for back pain.  Skin: Negative for  rash.  All other systems reviewed and are negative.    Allergies  Sulfa antibiotics and Penicillins  Home Medications   Current Outpatient Rx  Name Route Sig Dispense Refill  . ABACAVIR SULFATE 300 MG PO TABS Oral Take 300 mg by mouth daily.     . ATAZANAVIR SULFATE 300 MG PO CAPS Oral Take 1 capsule (300 mg total) by mouth daily. 30 capsule 11  . FLUCONAZOLE 100 MG PO TABS Oral Take 100 mg by mouth once a week. mondays     . HYDROCHLOROTHIAZIDE 25 MG PO TABS Oral Take 1 tablet (25 mg total) by mouth daily. 90 tablet 3  . RITONAVIR 100 MG PO TABS Oral Take 1 tablet (100 mg total) by mouth daily. 30 tablet 11  . TENOFOVIR DISOPROXIL FUMARATE 300 MG PO TABS Oral Take 1 tablet (300 mg total) by mouth daily. 30 tablet 11  . DIPHENHYDRAMINE HCL 25 MG PO TABS Oral Take 25 mg by mouth 4 (four) times daily as needed. For allergies    . NAPROXEN SODIUM 220 MG PO TABS Oral Take 220 mg by mouth 2 (two) times daily as needed. For pain       BP 122/62  Pulse 85  Temp(Src) 98.6 F (37 C) (Oral)  Resp 18  SpO2 98%  Physical Exam  Nursing note and vitals reviewed. Constitutional: She is oriented to person, place, and time. She appears well-developed and well-nourished. No distress.  HENT:  Head: Normocephalic and atraumatic.  Mouth/Throat: Oropharynx is clear and moist.  Eyes: EOM  are normal. Pupils are equal, round, and reactive to light.  Neck: Normal range of motion. Neck supple.  Cardiovascular: Normal rate, regular rhythm, normal heart sounds and intact distal pulses.  Exam reveals no friction rub.   No murmur heard. Pulmonary/Chest: Effort normal and breath sounds normal. She has no wheezes. She has no rales.  Abdominal: Soft. Bowel sounds are normal. She exhibits no distension. There is no tenderness. There is no rebound and no guarding.  Musculoskeletal: Normal range of motion. She exhibits no tenderness.       No edema  Neurological: She is alert and oriented to person, place,  and time. No cranial nerve deficit.  Skin: Skin is warm and dry. No rash noted.  Psychiatric: She has a normal mood and affect. Her behavior is normal.    ED Course  Procedures (including critical care time)   Labs Reviewed  CBC  DIFFERENTIAL  COMPREHENSIVE METABOLIC PANEL  URINALYSIS, ROUTINE W REFLEX MICROSCOPIC  URINE CULTURE   No results found.   No diagnosis found.    MDM   Patient with a viral-like illness. With myalgias, mild nausea, and loose stools since Friday. Denies any fever, abdominal pain, cough, congestion, or chest pain. Vital signs are within normal limits and patient is well-appearing on exam. No abnormal findings on exam today. Abdomen is soft and nontender. Patient denies any blood in her stool and states that she does not normally have diarrhea. Will check CBC, CMP, chest x-ray, UA to further evaluate. If patient is able to give a stool sample we will send it for O&P and culture. She is not at risk for C. difficile at this time. Patient moved to the CDU.        Gwyneth Sprout, MD 02/10/11 1213

## 2011-02-11 LAB — URINE CULTURE
Culture  Setup Time: 201212022148
Culture: NO GROWTH

## 2011-02-14 ENCOUNTER — Other Ambulatory Visit: Payer: Self-pay | Admitting: *Deleted

## 2011-02-14 ENCOUNTER — Encounter (INDEPENDENT_AMBULATORY_CARE_PROVIDER_SITE_OTHER): Payer: Self-pay | Admitting: Internal Medicine

## 2011-02-14 ENCOUNTER — Encounter: Payer: Self-pay | Admitting: Internal Medicine

## 2011-02-14 ENCOUNTER — Ambulatory Visit: Payer: Self-pay | Admitting: Internal Medicine

## 2011-02-14 VITALS — BP 138/74 | HR 85 | Temp 97.6°F | Ht 61.0 in | Wt 199.0 lb

## 2011-02-14 DIAGNOSIS — Z23 Encounter for immunization: Secondary | ICD-10-CM

## 2011-02-14 DIAGNOSIS — E876 Hypokalemia: Secondary | ICD-10-CM

## 2011-02-14 DIAGNOSIS — B20 Human immunodeficiency virus [HIV] disease: Secondary | ICD-10-CM

## 2011-02-14 DIAGNOSIS — I10 Essential (primary) hypertension: Secondary | ICD-10-CM

## 2011-02-14 DIAGNOSIS — D709 Neutropenia, unspecified: Secondary | ICD-10-CM

## 2011-02-14 DIAGNOSIS — Z21 Asymptomatic human immunodeficiency virus [HIV] infection status: Secondary | ICD-10-CM

## 2011-02-14 MED ORDER — HYDROCHLOROTHIAZIDE 25 MG PO TABS: 25.0000 mg | ORAL_TABLET | Freq: Every day | ORAL | Status: DC

## 2011-02-14 MED ORDER — POTASSIUM CHLORIDE ER 10 MEQ PO TBCR: 10.0000 meq | EXTENDED_RELEASE_TABLET | Freq: Every day | ORAL | Status: DC

## 2011-02-14 MED ORDER — POTASSIUM CHLORIDE POWD: 20.0000 meq | Freq: Every day | Status: DC

## 2011-02-15 LAB — CBC WITH DIFFERENTIAL/PLATELET
Basophils Relative: 0 % (ref 0–1)
Eosinophils Absolute: 0.1 10*3/uL (ref 0.0–0.7)
Eosinophils Relative: 2 % (ref 0–5)
MCH: 29.1 pg (ref 26.0–34.0)
MCHC: 32.6 g/dL (ref 30.0–36.0)
Neutrophils Relative %: 40 % — ABNORMAL LOW (ref 43–77)
Platelets: 276 10*3/uL (ref 150–400)
RBC: 3.78 MIL/uL — ABNORMAL LOW (ref 3.87–5.11)
RDW: 14.2 % (ref 11.5–15.5)

## 2011-02-15 LAB — BASIC METABOLIC PANEL
Calcium: 9 mg/dL (ref 8.4–10.5)
Creat: 0.61 mg/dL (ref 0.50–1.10)
Sodium: 142 mEq/L (ref 135–145)

## 2011-03-07 ENCOUNTER — Other Ambulatory Visit: Payer: Self-pay | Admitting: *Deleted

## 2011-03-25 ENCOUNTER — Telehealth: Payer: Self-pay

## 2011-03-25 NOTE — Telephone Encounter (Signed)
Called patient to let them know [employer] wanted them to enroll in Medicare Part D - not showing they have Medicare in system, but gave her the [facility] number to call for Part D just in case - also, left message for her to recertify for [employer].

## 2011-03-25 NOTE — Telephone Encounter (Signed)
Per phone call from [facility], of [employer], patient enrolled in Medicare and has part D, as well as A & B - patient just received [employer] on 03/12/11.

## 2011-04-15 ENCOUNTER — Ambulatory Visit (INDEPENDENT_AMBULATORY_CARE_PROVIDER_SITE_OTHER): Payer: Self-pay | Admitting: *Deleted

## 2011-04-15 ENCOUNTER — Ambulatory Visit: Payer: Self-pay

## 2011-04-15 ENCOUNTER — Encounter: Payer: Self-pay | Admitting: *Deleted

## 2011-04-15 VITALS — BP 128/85 | HR 78 | Temp 98.5°F | Wt 199.8 lb

## 2011-04-15 DIAGNOSIS — B20 Human immunodeficiency virus [HIV] disease: Secondary | ICD-10-CM

## 2011-04-15 LAB — LIPID PANEL
Cholesterol: 180 mg/dL (ref 0–200)
Total CHOL/HDL Ratio: 3.8 Ratio
VLDL: 14 mg/dL (ref 0–40)

## 2011-04-15 LAB — COMPREHENSIVE METABOLIC PANEL
ALT: 9 U/L (ref 0–35)
AST: 15 U/L (ref 0–37)
CO2: 26 mEq/L (ref 19–32)
Calcium: 9 mg/dL (ref 8.4–10.5)
Chloride: 102 mEq/L (ref 96–112)
Sodium: 139 mEq/L (ref 135–145)
Total Bilirubin: 2.4 mg/dL — ABNORMAL HIGH (ref 0.3–1.2)
Total Protein: 7.5 g/dL (ref 6.0–8.3)

## 2011-04-15 NOTE — Progress Notes (Signed)
[name] [name] is here for her week 304 Allrt study visit. She denies any new problems. She has recently moved and has not been able to get her BP med refilled, so she hasn't taken it in about a month. She has an appt scheduled with Dr. Orvan Falconer in March.

## 2011-05-03 ENCOUNTER — Encounter: Payer: Self-pay | Admitting: Internal Medicine

## 2011-05-03 LAB — CD4/CD8 (T-HELPER/T-SUPPRESSOR CELL)
CD4%: 20.3
CD4: 305
CD8: 615

## 2011-05-09 ENCOUNTER — Other Ambulatory Visit: Payer: Self-pay

## 2011-05-17 ENCOUNTER — Ambulatory Visit (INDEPENDENT_AMBULATORY_CARE_PROVIDER_SITE_OTHER): Payer: Self-pay | Admitting: *Deleted

## 2011-05-17 DIAGNOSIS — Z124 Encounter for screening for malignant neoplasm of cervix: Secondary | ICD-10-CM

## 2011-05-17 DIAGNOSIS — Z1231 Encounter for screening mammogram for malignant neoplasm of breast: Secondary | ICD-10-CM

## 2011-05-17 DIAGNOSIS — Z0000 Encounter for general adult medical examination without abnormal findings: Secondary | ICD-10-CM

## 2011-05-17 NOTE — Progress Notes (Signed)
  Subjective:     [name] [name] is a 56 y.o. woman who comes in today for a  pap smear only.  Menopausal  Objective:    There were no vitals taken for this visit. Pelvic Exam: Pap smear obtained.   Assessment:    Screening pap smear.   Plan:    Follow up in one year, or as indicated by Pap results.   Pt given educational materials re:  HIV and women over 50, diet, exercise, nutrition, heart disease, BSE and self-esteem.

## 2011-05-17 NOTE — Patient Instructions (Signed)
  Your results will be ready in about a week.  I will mail them to you.  Thank you for coming to the Center for your care.  Lenon Kuennen 

## 2011-05-21 ENCOUNTER — Encounter: Payer: Self-pay | Admitting: *Deleted

## 2011-05-23 ENCOUNTER — Encounter: Payer: Self-pay | Admitting: Internal Medicine

## 2011-05-23 ENCOUNTER — Other Ambulatory Visit: Payer: Self-pay | Admitting: *Deleted

## 2011-05-23 ENCOUNTER — Ambulatory Visit (INDEPENDENT_AMBULATORY_CARE_PROVIDER_SITE_OTHER): Payer: Self-pay | Admitting: Internal Medicine

## 2011-05-23 VITALS — BP 166/92 | HR 73 | Temp 97.7°F | Ht 61.0 in | Wt 196.2 lb

## 2011-05-23 DIAGNOSIS — B20 Human immunodeficiency virus [HIV] disease: Secondary | ICD-10-CM

## 2011-05-23 DIAGNOSIS — I10 Essential (primary) hypertension: Secondary | ICD-10-CM

## 2011-05-23 MED ORDER — HYDROCHLOROTHIAZIDE 25 MG PO TABS: 25.0000 mg | ORAL_TABLET | Freq: Every day | ORAL | Status: DC

## 2011-05-23 NOTE — Progress Notes (Signed)
Patient ID: [name] [name], female   DOB: [birth year], 56 y.o.   MRN: [id number]  INFECTIOUS DISEASE PROGRESS NOTE    Subjective: [name] [name] is in for her routine visit. She is in a new apartment by herself and feeling much better. She has decided to stay in [place] and not move to [place]. She has missed only one dose of her HIV meds when she was away from home late one night. She stopped taking her HCTZ about a month ago, noting it makes her urinate a lot. She has been exercising more and cut out junk food recently. She has lost about 30 pounds.  She continues to have some numbness and burning in her left foot. It is there almost continuously and is worse when she is standing for long periods of time. She feels that it improves when she takes Naprosyn once daily.  Objective: Temp: 97.7 F (36.5 C) (03/14 0927) Temp src: Oral (03/14 0927) BP: 166/92 mmHg (03/14 0932) Pulse Rate: 73  (03/14 0932)  General: happy Skin: no rash Lungs: clear Cor: reg S1 and S2 without murmurs Abdomen: soft, obese and nontender   Lab Results HIV 1 RNA Quant (copies/mL)  Date Value  09/11/2010 <20   02/07/2010 56*  01/16/2010 46      HIV-1 RNA Viral Load (no units)  Date Value  04/15/2011 <40   11/23/2010 <40   06/04/2010 <40      CD4 T Cell Abs (cmm)  Date Value  09/11/2010 230*  02/07/2010 210*  09/07/2009 130*     Assessment: Her HIV remains under excellent control.  I will continue her current regimen.  Her blood pressure is not at goal because she has been off of therapy. I offered to start her on an alternative regimen that does not cause diuresis but she wants to go back to taking the hydrochlorothiazide and states that she will not miss doses.  Her LDL cholesterol and weight remain elevated but I'm hopeful that with her lifestyle modification both of these will improve over the next year.  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 She  can continue to take Naprosyn as needed.  Plan: 1. Continue current antiretroviral regimen 2. Restart hydrochlorothiazide 3. Lifestyle modification for weight and cholesterol control 4. Return to clinic in 3 months   Cliffton Asters, MD [facility] [phone] pager   [phone] cell 05/23/2011, 9:50 AM

## 2011-06-04 ENCOUNTER — Other Ambulatory Visit: Payer: Self-pay | Admitting: Internal Medicine

## 2011-06-04 DIAGNOSIS — Z113 Encounter for screening for infections with a predominantly sexual mode of transmission: Secondary | ICD-10-CM

## 2011-06-19 ENCOUNTER — Other Ambulatory Visit: Payer: Self-pay | Admitting: *Deleted

## 2011-06-19 DIAGNOSIS — Z113 Encounter for screening for infections with a predominantly sexual mode of transmission: Secondary | ICD-10-CM

## 2011-06-27 ENCOUNTER — Ambulatory Visit (HOSPITAL_COMMUNITY): Payer: Self-pay

## 2011-07-01 ENCOUNTER — Encounter: Payer: Self-pay | Admitting: *Deleted

## 2011-07-01 NOTE — Progress Notes (Signed)
Patient stopped by the clinic today and said she was having a bad outbreak of herpes on her "backside" that started 1 week ago. She feels like she needs to be seen by a gyn. She says she had some valtrex left and started taking it again once a day, but wants some kind of cream to put on the sores that she said a gyn MD gave her for them. I told her she probably should take the valtrex twice a day when she is having an outbreak. I spoke with Dr. Orvan Falconer and he wants her to come here for evaluation instead of going to [facility]. I called her and left a message for her to call us back and schedule an appt asap.

## 2011-07-09 ENCOUNTER — Ambulatory Visit (HOSPITAL_COMMUNITY)
Admission: RE | Admit: 2011-07-09 | Discharge: 2011-07-09 | Disposition: A | Discharge status: Home / Self Care | Payer: Self-pay | Source: Ambulatory Visit | Attending: Internal Medicine | Admitting: Internal Medicine

## 2011-07-09 DIAGNOSIS — Z1231 Encounter for screening mammogram for malignant neoplasm of breast: Secondary | ICD-10-CM | POA: Insufficient documentation

## 2011-07-11 ENCOUNTER — Ambulatory Visit (INDEPENDENT_AMBULATORY_CARE_PROVIDER_SITE_OTHER): Payer: Self-pay | Admitting: Internal Medicine

## 2011-07-11 ENCOUNTER — Encounter: Payer: Self-pay | Admitting: Internal Medicine

## 2011-07-11 VITALS — BP 147/70 | HR 76 | Temp 97.4°F | Ht 61.0 in | Wt 193.0 lb

## 2011-07-11 DIAGNOSIS — A6000 Herpesviral infection of urogenital system, unspecified: Secondary | ICD-10-CM

## 2011-07-11 MED ORDER — ACYCLOVIR 5 % EX OINT: TOPICAL_OINTMENT | CUTANEOUS | Status: DC

## 2011-07-11 MED ORDER — VALACYCLOVIR HCL 500 MG PO TABS: 500.0000 mg | ORAL_TABLET | Freq: Three times a day (TID) | ORAL | Status: DC

## 2011-07-11 MED ORDER — VALACYCLOVIR HCL 1 G PO TABS: 1000.0000 mg | ORAL_TABLET | Freq: Three times a day (TID) | ORAL | Status: DC

## 2011-07-11 NOTE — Assessment & Plan Note (Signed)
She is unable to tolerate 1000 mg of Valtrex and therefore I have suggested she take the 500 mg 3 times a day. I also have prescribed her topical ointment acyclovir. She will return to clinic for her routine followup as scheduled and otherwise was told to return if it does not improve in about one week. I also suggested that she try it twice daily Valtrex for suppressive in between outbreaks to see if that helps limit the frequency.

## 2011-07-11 NOTE — Progress Notes (Signed)
  Subjective:    Patient ID: [name] [name], female    DOB: [birth year], 56 y.o.   MRN: [id number]  HPI Here for evaluation and genital herpes labialis. She has had this in the past and in fact gets very frequently. Does take daily Valtrex however continues to have significant outbreaks and has had this current lesion for over a week. She has been taking twice daily Valtrex but has had essentially no relief. She has significant discharge and pain. This is the same area as she gets up in the past. No fever or chills.   Review of Systems  Constitutional: Negative for fever and chills.  Genitourinary: Positive for genital sores.  Skin: Negative.        Objective:   Physical Exam  Constitutional: She appears well-developed and well-nourished. No distress.  Genitourinary:       Exam performed with the nurse. She does have a 5 mm x 5 mm ulcerative lesion with discharge on her right labia majora is. It is painful., Tender.          Assessment & Plan:

## 2011-07-30 ENCOUNTER — Other Ambulatory Visit: Payer: Self-pay | Admitting: *Deleted

## 2011-07-30 DIAGNOSIS — B20 Human immunodeficiency virus [HIV] disease: Secondary | ICD-10-CM

## 2011-07-30 DIAGNOSIS — I10 Essential (primary) hypertension: Secondary | ICD-10-CM

## 2011-07-30 MED ORDER — RITONAVIR 100 MG PO TABS: 100.0000 mg | ORAL_TABLET | Freq: Every day | ORAL | Status: DC

## 2011-07-30 MED ORDER — ATAZANAVIR SULFATE 300 MG PO CAPS: 300.0000 mg | ORAL_CAPSULE | Freq: Every day | ORAL | Status: DC

## 2011-07-30 MED ORDER — ABACAVIR SULFATE 300 MG PO TABS: 300.0000 mg | ORAL_TABLET | Freq: Every day | ORAL | Status: DC

## 2011-07-30 MED ORDER — TENOFOVIR DISOPROXIL FUMARATE 300 MG PO TABS: 300.0000 mg | ORAL_TABLET | Freq: Every day | ORAL | Status: DC

## 2011-07-30 MED ORDER — HYDROCHLOROTHIAZIDE 25 MG PO TABS: 25.0000 mg | ORAL_TABLET | Freq: Every day | ORAL | Status: DC

## 2011-07-30 NOTE — Telephone Encounter (Signed)
Pt called & asked that they be sent to [facility] on [address] instead of the one in [place]. I sent them. States she is out of some meds I told her she can always call 3 days before she runs out & we can refill them

## 2011-07-31 ENCOUNTER — Other Ambulatory Visit: Payer: Self-pay | Admitting: *Deleted

## 2011-07-31 ENCOUNTER — Ambulatory Visit: Payer: Self-pay | Admitting: *Deleted

## 2011-07-31 VITALS — BP 118/78 | HR 73 | Temp 98.4°F | Resp 16 | Wt 188.5 lb

## 2011-07-31 DIAGNOSIS — B20 Human immunodeficiency virus [HIV] disease: Secondary | ICD-10-CM

## 2011-07-31 LAB — LIPID PANEL
HDL: 50 mg/dL (ref >39)
Total CHOL/HDL Ratio: 4.1 Ratio
Triglycerides: 66 mg/dL (ref <150)

## 2011-07-31 LAB — CBC WITH DIFFERENTIAL/PLATELET
Basophils Absolute: 0 10*3/uL (ref 0.0–0.1)
Basophils Relative: 0 % (ref 0–1)
Eosinophils Absolute: 0.1 10*3/uL (ref 0.0–0.7)
Eosinophils Relative: 3 % (ref 0–5)
Lymphocytes Relative: 33 % (ref 12–46)
MCH: 28.2 pg (ref 26.0–34.0)
MCHC: 33.1 g/dL (ref 30.0–36.0)
MCV: 85.1 fL (ref 78.0–100.0)
Platelets: 232 10*3/uL (ref 150–400)
RDW: 15 % (ref 11.5–15.5)
WBC: 3.9 10*3/uL — ABNORMAL LOW (ref 4.0–10.5)

## 2011-07-31 LAB — COMPREHENSIVE METABOLIC PANEL
AST: 17 U/L (ref 0–37)
BUN: 10 mg/dL (ref 6–23)
Calcium: 9.2 mg/dL (ref 8.4–10.5)
Chloride: 104 mEq/L (ref 96–112)
Creat: 0.64 mg/dL (ref 0.50–1.10)

## 2011-07-31 MED ORDER — VALACYCLOVIR HCL 500 MG PO TABS: 500.0000 mg | ORAL_TABLET | Freq: Three times a day (TID) | ORAL | Status: DC

## 2011-07-31 MED ORDER — ACYCLOVIR 5 % EX OINT: TOPICAL_OINTMENT | CUTANEOUS | Status: DC

## 2011-07-31 NOTE — Progress Notes (Signed)
Patient here for week 320 ALLRT study visit. She has one more visit for ALLRT, before the study closes. She is eligible for another study after that looking at HIV and Aging that should open in the Fall. She  c/o painful herpes lesion in her peri area, that burns and keeps her awake at night. She saw Dr. Luciana Axe about it a few weeks ago and he switched her valtrex to 500mg  TID and prescibed acyclovir ointment. Unfortunately, the ointment is very expensive and she cannot afford it. She also is worried that she is getting dementia. She has had several friends that have commented about her forgetfulness and inability to focus. This has been going on for awhile, but seems to be progressing. She has found herself going places and forgetting why she went there. 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 She has some left foot pain, numbness at the base of her toes. She says that tylenol does not help the pain. I suggested that she try either aleve or advil and see if that worked any better. She is to see Dr. Orvan Falconer in June and I have encouraged her to talk to him about her concerns.

## 2011-08-13 ENCOUNTER — Other Ambulatory Visit: Payer: Self-pay

## 2011-08-17 ENCOUNTER — Other Ambulatory Visit: Payer: Self-pay | Admitting: Internal Medicine

## 2011-08-27 ENCOUNTER — Encounter: Payer: Self-pay | Admitting: Internal Medicine

## 2011-08-27 ENCOUNTER — Ambulatory Visit (INDEPENDENT_AMBULATORY_CARE_PROVIDER_SITE_OTHER): Payer: Self-pay | Admitting: Internal Medicine

## 2011-08-27 VITALS — BP 133/87 | HR 80 | Temp 97.8°F | Ht 61.0 in | Wt 185.0 lb

## 2011-08-27 DIAGNOSIS — B20 Human immunodeficiency virus [HIV] disease: Secondary | ICD-10-CM

## 2011-08-27 MED ORDER — ABACAVIR SULFATE 300 MG PO TABS: 300.0000 mg | ORAL_TABLET | Freq: Two times a day (BID) | ORAL | Status: DC

## 2011-08-27 NOTE — Progress Notes (Signed)
Patient ID: [name] [name], female   DOB: 03-18-1955, 56 y.o.   MRN: [id number]     [facility] for Infectious Disease  Patient Active Problem List  Diagnosis  . HIV INFECTION  . VIRAL MENINGITIS  . GENITAL HERPES  . LESION, VAGINA  . ANEMIA  . DEPENDENCE, COCAINE, UNSPECIFIED  . DEPRESSION  . HYPERTENSION  . ALLERGIC RHINITIS  . DENTAL CARIES  . GERD  . GROSS HEMATURIA  . PREGNANCY, ECTOPIC NOS W/O INTRAUTERINE PRG  . ABORTION NOS W/PELVIC DAMAGE, UNSPECIFIED  . DEGENERATIVE JOINT DISEASE  . LOW BACK PAIN, ACUTE  . SKIN RASH  . PAPANICOLAOU SMEAR OF VAGINA WITH LGSIL  . ANGIOEDEMA  . BUNIONECTOMY, HX OF  . POSTPROCEDURAL STATUS NEC  . Rectal bleeding    Patient's Medications  New Prescriptions   No medications on file  Previous Medications   ACYCLOVIR OINTMENT (ZOVIRAX) 5 %    Apply topically every 3 (three) hours.   ATAZANAVIR (REYATAZ) 300 MG CAPSULE    Take 1 capsule (300 mg total) by mouth daily.   HYDROCHLOROTHIAZIDE (HYDRODIURIL) 25 MG TABLET    Take 1 tablet (25 mg total) by mouth daily.   NAPROXEN SODIUM (ANAPROX) 220 MG TABLET    Take 220 mg by mouth 2 (two) times daily as needed. For pain    RITONAVIR (NORVIR) 100 MG TABS    Take 1 tablet (100 mg total) by mouth daily.   TENOFOVIR (VIREAD) 300 MG TABLET    Take 1 tablet (300 mg total) by mouth daily.   VALACYCLOVIR (VALTREX) 500 MG TABLET    Take 1 tablet (500 mg total) by mouth 3 (three) times daily. Instead of the 1000 mg tabs  Modified Medications   Modified Medication Previous Medication   ABACAVIR (ZIAGEN) 300 MG TABLET abacavir (ZIAGEN) 300 MG tablet      Take 1 tablet (300 mg total) by mouth 2 (two) times daily.    Take 1 tablet (300 mg total) by mouth daily.  Discontinued Medications   ABACAVIR (ZIAGEN) 300 MG TABLET    TAKE TWO TABLETS BY MOUTH EVERY DAY AS DIRECTED   ABACAVIR (ZIAGEN) 300 MG TABLET    Take 300 mg by mouth 2 (two) times daily.   DIPHENHYDRAMINE (BENADRYL) 25 MG TABLET    Take 25  mg by mouth 4 (four) times daily as needed. For allergies    Subjective: [name] [name] is in for her routine visit. It appears that she has 2 different doses of her Ziagen better both incorrect. Fortunately because she was unaware that they were different she is taking it correctly twice daily. She's only missed 2 days of her HIV medicines since her last visit when the pharmacy was late filling her Viread and she knew to stop her other medications.   Objective: Temp: 97.8 F (36.6 C) (06/18 1027) Temp src: Oral (06/18 1027) BP: 133/87 mmHg (06/18 1027) Pulse Rate: 80  (06/18 1027)  General: She has continued to lose weight gradually through dietary modification Skin: No rash Lungs: Clear Cor: Regular S1 and S2 no murmurs  Lab Results HIV 1 RNA Quant (copies/mL)  Date Value  09/11/2010 <20   02/07/2010 56*  01/16/2010 46      HIV-1 RNA Viral Load (no units)  Date Value  04/15/2011 <40   11/23/2010 <40   06/04/2010 <40      CD4 T Cell Abs (cmm)  Date Value  09/11/2010 230*  02/07/2010 210*  09/07/2009 130*  Assessment: Her HIV remains under good control. I will repeat her lab work today.  Plan: 1. Continue current antiretroviral regimen with corrected dosing. 2. Check CD4 and viral load today   Cliffton Asters, MD [facility] for Infectious Disease [facility] [phone] pager   [phone] cell 08/27/2011, 10:50 AM

## 2011-08-28 LAB — HIV-1 RNA QUANT-NO REFLEX-BLD
HIV 1 RNA Quant: <20 copies/mL (ref <20)
HIV-1 RNA Quant, Log: <1.3 {Log} (ref <1.30)

## 2011-08-28 LAB — T-HELPER CELL (CD4) - (RCID CLINIC ONLY)
CD4 % Helper T Cell: 17 % — ABNORMAL LOW (ref 33–55)
CD4 T Cell Abs: 230 uL — ABNORMAL LOW (ref 400–2700)

## 2011-09-03 ENCOUNTER — Telehealth: Payer: Self-pay | Admitting: *Deleted

## 2011-09-03 NOTE — Telephone Encounter (Signed)
Pt called concerned that she has been very forgetful. She is worried that she has dementia or alzheimers. Wants to be tested. She has medicare A only. To md to see where she might be referred. Another rn states there is a neuropsych md in town who may do this but there would be a charge. We could try [facility] but unsure if they have a volunteer in this area  To md for guidance

## 2011-09-03 NOTE — Telephone Encounter (Signed)
Please give Ernesha an appointment with one of my partners who are clinic doctors next month to evaluate her concerns about memory loss.

## 2011-09-24 ENCOUNTER — Ambulatory Visit: Payer: Self-pay | Admitting: Internal Medicine

## 2011-09-30 ENCOUNTER — Ambulatory Visit: Payer: Self-pay

## 2011-09-30 ENCOUNTER — Encounter: Payer: Self-pay | Admitting: Internal Medicine

## 2011-09-30 ENCOUNTER — Ambulatory Visit (INDEPENDENT_AMBULATORY_CARE_PROVIDER_SITE_OTHER): Payer: Self-pay | Admitting: Internal Medicine

## 2011-09-30 VITALS — BP 145/79 | HR 89 | Temp 97.9°F | Wt 187.0 lb

## 2011-09-30 DIAGNOSIS — B20 Human immunodeficiency virus [HIV] disease: Secondary | ICD-10-CM

## 2011-10-05 NOTE — Progress Notes (Signed)
HIV CLINIC VISIT  RFV: forgetfullness Subjective:    Patient ID: [name] [name], female    DOB: Mar 23, 1955, 56 y.o.   MRN: [id number]  HPI Ms. Mennen is a 56yo F with HIV, Cd 4 count of 230(17%)/VL <20, currently on abacavir/tenofovir/ritonavir who has been taking her medications without missing doses. She notices as well as others have commented that she has been more forgetful. Unable to recall recent discussions. She is concerned that she may have dementia. No other examples she can name. She denies missing any recent payments, bills.able to do executive functions  Current Outpatient Prescriptions on File Prior to Visit  Medication Sig Dispense Refill  . abacavir (ZIAGEN) 300 MG tablet Take 1 tablet (300 mg total) by mouth 2 (two) times daily.  60 tablet  11  . acyclovir ointment (ZOVIRAX) 5 % Apply topically every 3 (three) hours.  30 g  3  . atazanavir (REYATAZ) 300 MG capsule Take 1 capsule (300 mg total) by mouth daily.  30 capsule  11  . hydrochlorothiazide (HYDRODIURIL) 25 MG tablet Take 1 tablet (25 mg total) by mouth daily.  30 tablet  6  . naproxen sodium (ANAPROX) 220 MG tablet Take 220 mg by mouth 2 (two) times daily as needed. For pain       . ritonavir (NORVIR) 100 MG TABS Take 1 tablet (100 mg total) by mouth daily.  30 tablet  11  . tenofovir (VIREAD) 300 MG tablet Take 1 tablet (300 mg total) by mouth daily.  30 tablet  11  . valACYclovir (VALTREX) 500 MG tablet Take 1 tablet (500 mg total) by mouth 3 (three) times daily. Instead of the 1000 mg tabs  30 tablet  2   Active Ambulatory Problems    Diagnosis Date Noted  . HIV INFECTION 08/12/2006  . VIRAL MENINGITIS 09/14/2008  . GENITAL HERPES 01/13/2006  . LESION, VAGINA 10/05/2009  . ANEMIA 01/13/2006  . DEPENDENCE, COCAINE, UNSPECIFIED 01/13/2006  . DEPRESSION 03/22/2008  . HYPERTENSION 03/29/2009  . ALLERGIC RHINITIS 01/13/2006  . DENTAL CARIES 09/21/2007  . GERD 01/13/2006  . GROSS HEMATURIA 08/26/2007  .  PREGNANCY, ECTOPIC NOS W/O INTRAUTERINE PRG 01/13/2006  . ABORTION NOS W/PELVIC DAMAGE, UNSPECIFIED 01/13/2006  . DEGENERATIVE JOINT DISEASE 08/12/2006  . LOW BACK PAIN, ACUTE 01/16/2010  . SKIN RASH 09/14/2008  . PAPANICOLAOU SMEAR OF VAGINA WITH LGSIL 01/25/2009  . ANGIOEDEMA 01/13/2006  . BUNIONECTOMY, HX OF 01/13/2006  . POSTPROCEDURAL STATUS NEC 01/13/2006  . Rectal bleeding 06/26/2010   Resolved Ambulatory Problems    Diagnosis Date Noted  . DJD (degenerative joint disease) 09/25/2010   Past Medical History  Diagnosis Date  . HIV (human immunodeficiency virus infection) 2007  . Hypertension   . GERD (gastroesophageal reflux disease)     Review of Systems 10 point Review of Systems reviwed, no positive pertinents other than waht is mentioned in HPI      Objective:   Physical Exam BP 145/79  Pulse 89  Temp 97.9 F (36.6 C) (Oral)  Wt 187 lb (84.823 kg) Gen= a x o by 3 in NAD Neuro= CNS 2-12 intact, 5/5 motor     Assessment & Plan:  Possible neurocognitive impairment = discussed with patient that people with HIV do have higher risk of having NCI than hiv- patients. Will have the patient due a mini-mental exam; can also do other simple test such as color trails, HIV dementia scale. Also ask her to keep a list of events that concern her  for memory problems in order to identify behavior modification to improve on these episodes  - unclear if changing ART regimen that has better CNS penetration would have any benefit.  - if symptoms persist or worsen, can consider doing formal [place] testing  rtc in 1 wk for  testing

## 2011-10-10 ENCOUNTER — Ambulatory Visit (INDEPENDENT_AMBULATORY_CARE_PROVIDER_SITE_OTHER): Payer: Self-pay | Admitting: Internal Medicine

## 2011-10-10 ENCOUNTER — Encounter: Payer: Self-pay | Admitting: Internal Medicine

## 2011-10-10 VITALS — BP 143/84 | HR 83 | Temp 98.3°F | Ht 61.0 in | Wt 187.0 lb

## 2011-10-10 DIAGNOSIS — B20 Human immunodeficiency virus [HIV] disease: Secondary | ICD-10-CM

## 2011-10-10 NOTE — Progress Notes (Signed)
HIV CLINIC NOTE  RFV: concern for neurocognitive impairment. Patient here for testing Subjective:    Patient ID: [name] [name], female    DOB: Aug 31, 1955, 56 y.o.   MRN: [id number]  HPIMs. [name] [name] is a 56yo F with HIV, Cd 4 count of 230(17%)/VL <20, currently on abacavir/tenofovir/atazanavir/ritonavir who has been taking her medications without missing doses. We saw [name] [name] in clinic last week regarding her concern for forgetfullness. And she is concerned about dementia.  I have asked her to come back to clinic to undergo a battery of tests to assess HIV dementia:  Mini-mental status exam = 29 of 30 ( 1 point missed for recall) GAD-7 screen = 11 PHQ-9 screen = 7 International HIV dementia Scale = 10 (1 point missed for memory-recall & 1 point for psychomotor speed)  At this visit, we did not do any fluency, executive function, speed of info process, motor skills, attention/working memory or verbal and visual learning assessments.  Current Outpatient Prescriptions on File Prior to Visit  Medication Sig Dispense Refill  . abacavir (ZIAGEN) 300 MG tablet Take 1 tablet (300 mg total) by mouth 2 (two) times daily.  60 tablet  11  . acyclovir ointment (ZOVIRAX) 5 % Apply topically every 3 (three) hours.  30 g  3  . atazanavir (REYATAZ) 300 MG capsule Take 1 capsule (300 mg total) by mouth daily.  30 capsule  11  . hydrochlorothiazide (HYDRODIURIL) 25 MG tablet Take 1 tablet (25 mg total) by mouth daily.  30 tablet  6  . naproxen sodium (ANAPROX) 220 MG tablet Take 220 mg by mouth 2 (two) times daily as needed. For pain       . ritonavir (NORVIR) 100 MG TABS Take 1 tablet (100 mg total) by mouth daily.  30 tablet  11  . tenofovir (VIREAD) 300 MG tablet Take 1 tablet (300 mg total) by mouth daily.  30 tablet  11  . valACYclovir (VALTREX) 500 MG tablet Take 1 tablet (500 mg total) by mouth 3 (three) times daily. Instead of the 1000 mg tabs  30 tablet  2    Review of Systems     Objective:   Physical Exam BP 143/84  Pulse 83  Temp 98.3 F (36.8 C) (Oral)  Ht 5\' 1"  (1.549 m)  Wt 187 lb (84.823 kg)  BMI 35.33 kg/m2 .       Assessment & Plan:  56 yo F with HIV who presents with episodes of increasing memory loss concerning for neurocognitive impairment.  - we have done preliminary screens of MMSE, IHDS, plus depression and anxiety questionnaire. - her GAD-7 > 10, concerning that anxiety maybe in part related to her difficulty with memory.  - first we will refer to [facility], in house counselor, to see if he feels she would benefit from anti anxiety meds. - at next HIV visit, consider doing further [place] screening to test other domains. - will keep results of testing and repeat in 1 year to see if any other decline is noted.  - 30 minutes spent during this visit implementing [place] screening tests and interpreting results  [name] [name] B. Drue Second MD M[facility] [phone]

## 2011-10-22 ENCOUNTER — Ambulatory Visit (INDEPENDENT_AMBULATORY_CARE_PROVIDER_SITE_OTHER): Payer: Self-pay | Admitting: *Deleted

## 2011-10-22 VITALS — BP 145/82 | HR 73 | Temp 98.6°F | Wt 187.5 lb

## 2011-10-22 DIAGNOSIS — B20 Human immunodeficiency virus [HIV] disease: Secondary | ICD-10-CM

## 2011-10-22 DIAGNOSIS — I10 Essential (primary) hypertension: Secondary | ICD-10-CM

## 2011-10-22 LAB — COMPREHENSIVE METABOLIC PANEL
ALT: 12 U/L (ref 0–35)
AST: 17 U/L (ref 0–37)
Alkaline Phosphatase: 97 U/L (ref 39–117)
BUN: 7 mg/dL (ref 6–23)
Calcium: 9.3 mg/dL (ref 8.4–10.5)
Creat: 0.65 mg/dL (ref 0.50–1.10)
Total Bilirubin: 2.7 mg/dL — ABNORMAL HIGH (ref 0.3–1.2)

## 2011-10-22 LAB — CD4/CD8 (T-HELPER/T-SUPPRESSOR CELL)
CD4%: 19.9
CD8: 400

## 2011-10-22 LAB — LIPID PANEL
HDL: 50 mg/dL (ref >39)
LDL Cholesterol: 117 mg/dL — ABNORMAL HIGH (ref 0–99)
Total CHOL/HDL Ratio: 3.8 Ratio
VLDL: 21 mg/dL (ref 0–40)

## 2011-10-22 LAB — HIV-1 RNA QUANT-NO REFLEX-BLD: HIV-1 RNA Viral Load: <40

## 2011-10-22 MED ORDER — HYDROCHLOROTHIAZIDE 25 MG PO TABS: 25.0000 mg | ORAL_TABLET | Freq: Every day | ORAL | Status: DC

## 2011-10-22 NOTE — Progress Notes (Signed)
Patient here for her final ALLRT study visit. She is eligible for HAILO (HIV and Aging) which opens in the Fall. She is c/o severe left foot pain and numbness, which worsens the longer she is on her feet. Foot is warm and pulses intact. She is to see the counselopr on Thursday this week for the first time for anxiety and c/o forgetfulness.

## 2011-10-23 LAB — CREATININE, URINE, RANDOM: Creatinine, Urine: 279.9 mg/dL

## 2011-10-24 ENCOUNTER — Ambulatory Visit: Payer: Medicare Other

## 2011-12-05 ENCOUNTER — Other Ambulatory Visit (INDEPENDENT_AMBULATORY_CARE_PROVIDER_SITE_OTHER): Payer: Self-pay

## 2011-12-05 DIAGNOSIS — Z113 Encounter for screening for infections with a predominantly sexual mode of transmission: Secondary | ICD-10-CM

## 2011-12-05 DIAGNOSIS — B20 Human immunodeficiency virus [HIV] disease: Secondary | ICD-10-CM

## 2011-12-05 LAB — COMPREHENSIVE METABOLIC PANEL
Albumin: 3.7 g/dL (ref 3.5–5.2)
Alkaline Phosphatase: 101 U/L (ref 39–117)
BUN: 9 mg/dL (ref 6–23)
Calcium: 9.3 mg/dL (ref 8.4–10.5)
Chloride: 107 mEq/L (ref 96–112)
Creat: 0.64 mg/dL (ref 0.50–1.10)
Glucose, Bld: 102 mg/dL — ABNORMAL HIGH (ref 70–99)
Potassium: 3.5 mEq/L (ref 3.5–5.3)

## 2011-12-05 LAB — CBC
HCT: 38.2 % (ref 36.0–46.0)
Hemoglobin: 12.8 g/dL (ref 12.0–15.0)
MCHC: 33.5 g/dL (ref 30.0–36.0)
MCV: 87.8 fL (ref 78.0–100.0)

## 2011-12-06 LAB — HIV-1 RNA QUANT-NO REFLEX-BLD: HIV-1 RNA Quant, Log: 1.36 {Log} — ABNORMAL HIGH (ref <1.30)

## 2011-12-06 LAB — T-HELPER CELL (CD4) - (RCID CLINIC ONLY): CD4 % Helper T Cell: 20 % — ABNORMAL LOW (ref 33–55)

## 2011-12-09 ENCOUNTER — Emergency Department (HOSPITAL_COMMUNITY)
Admission: EM | Admit: 2011-12-09 | Discharge: 2011-12-09 | Disposition: A | Discharge status: Home / Self Care | Payer: Self-pay | Attending: Emergency Medicine | Admitting: Emergency Medicine

## 2011-12-09 ENCOUNTER — Emergency Department (HOSPITAL_COMMUNITY): Payer: Self-pay

## 2011-12-09 ENCOUNTER — Encounter (HOSPITAL_COMMUNITY): Payer: Self-pay

## 2011-12-09 DIAGNOSIS — M542 Cervicalgia: Secondary | ICD-10-CM | POA: Insufficient documentation

## 2011-12-09 DIAGNOSIS — K219 Gastro-esophageal reflux disease without esophagitis: Secondary | ICD-10-CM | POA: Insufficient documentation

## 2011-12-09 DIAGNOSIS — I10 Essential (primary) hypertension: Secondary | ICD-10-CM | POA: Insufficient documentation

## 2011-12-09 DIAGNOSIS — Z21 Asymptomatic human immunodeficiency virus [HIV] infection status: Secondary | ICD-10-CM | POA: Insufficient documentation

## 2011-12-09 DIAGNOSIS — Z79899 Other long term (current) drug therapy: Secondary | ICD-10-CM | POA: Insufficient documentation

## 2011-12-09 LAB — BASIC METABOLIC PANEL
CO2: 29 mEq/L (ref 19–32)
Calcium: 9.7 mg/dL (ref 8.4–10.5)
Creatinine, Ser: 0.59 mg/dL (ref 0.50–1.10)
GFR calc non Af Amer: >90 mL/min (ref >90)
Glucose, Bld: 88 mg/dL (ref 70–99)
Sodium: 140 mEq/L (ref 135–145)

## 2011-12-09 LAB — CBC WITH DIFFERENTIAL/PLATELET
Basophils Absolute: 0 10*3/uL (ref 0.0–0.1)
Eosinophils Absolute: 0.1 10*3/uL (ref 0.0–0.7)
Eosinophils Relative: 3 % (ref 0–5)
MCH: 30.7 pg (ref 26.0–34.0)
MCV: 89.6 fL (ref 78.0–100.0)
Neutrophils Relative %: 54 % (ref 43–77)
Platelets: 219 10*3/uL (ref 150–400)
RBC: 4.43 MIL/uL (ref 3.87–5.11)
RDW: 13.5 % (ref 11.5–15.5)
WBC: 4.8 10*3/uL (ref 4.0–10.5)

## 2011-12-09 MED ORDER — MORPHINE SULFATE 4 MG/ML IJ SOLN
4.0000 mg | Freq: Once | INTRAMUSCULAR | Status: AC
  Administered 2011-12-09: 4 mg via INTRAVENOUS
  Filled 2011-12-09: qty 1

## 2011-12-09 MED ORDER — IOHEXOL 300 MG/ML  SOLN
75.0000 mL | Freq: Once | INTRAMUSCULAR | Status: AC | PRN
  Administered 2011-12-09: 75 mL via INTRAVENOUS

## 2011-12-09 MED ORDER — CYCLOBENZAPRINE HCL 10 MG PO TABS: 10.0000 mg | ORAL_TABLET | Freq: Two times a day (BID) | ORAL | Status: DC | PRN

## 2011-12-09 MED ORDER — METHYLPREDNISOLONE SODIUM SUCC 125 MG IJ SOLR
125.0000 mg | Freq: Once | INTRAMUSCULAR | Status: AC
  Administered 2011-12-09: 125 mg via INTRAVENOUS
  Filled 2011-12-09: qty 2

## 2011-12-09 MED ORDER — ONDANSETRON HCL 4 MG/2ML IJ SOLN
4.0000 mg | Freq: Once | INTRAMUSCULAR | Status: AC
  Administered 2011-12-09: 4 mg via INTRAVENOUS
  Filled 2011-12-09: qty 2

## 2011-12-09 MED ORDER — HYDROCODONE-ACETAMINOPHEN 5-500 MG PO TABS: 1.0000 | ORAL_TABLET | Freq: Four times a day (QID) | ORAL | Status: DC | PRN

## 2011-12-09 MED ORDER — OXYCODONE-ACETAMINOPHEN 5-325 MG PO TABS
2.0000 | ORAL_TABLET | Freq: Once | ORAL | Status: AC
  Administered 2011-12-09: 2 via ORAL
  Filled 2011-12-09: qty 2

## 2011-12-09 MED ORDER — MORPHINE SULFATE 4 MG/ML IJ SOLN: 4.0000 mg | Freq: Once | INTRAMUSCULAR | Status: DC

## 2011-12-09 MED ORDER — SODIUM CHLORIDE 0.9 % IV SOLN
INTRAVENOUS | Status: DC
  Administered 2011-12-09: 16:00:00 via INTRAVENOUS

## 2011-12-09 NOTE — ED Notes (Signed)
Pt complains of neck pain, pt concerned she may have meningitiis again, pt also complains of headache

## 2011-12-09 NOTE — ED Provider Notes (Signed)
History   This chart was scribed for Cheri Guppy, MD by Gerlean Ren. This patient was seen in room TR10C/TR10C and the patient's care was started at 3:14PM.   CSN: [id number]  Arrival date & time 12/09/11  1245   First MD Initiated Contact with Patient 12/09/11 1513      Chief Complaint  Patient presents with  . Neck Injury    (Consider location/radiation/quality/duration/timing/severity/associated sxs/prior treatment) Patient is a 56 y.o. female presenting with neck injury. The history is provided by the patient. No language interpreter was used.  Neck Injury   [name] [name] is a 56 y.o. female with h/o HIV who presents to the Emergency Department complaining of constant, sharp, gradually worsening, non-radiating neck pain that is worsened with bilateral rotation and associated neck stiffness with sudden onset this morning.  Pt denies any fall or trauma to neck.  Pt denies cough, fever, sore throat, otalgia, swelling, and weakness as associated.  Pt reports last viral load was undetectable.    Past Medical History  Diagnosis Date  . HIV (human immunodeficiency virus infection) 2007  . Hypertension   . GERD (gastroesophageal reflux disease)     Past Surgical History  Procedure Date  . Ectopic pregnancy surgery     s/p right salpinooophrectomy  . Bunionectomy   . Uterine fibroid surgery     Family History  Problem Relation Age of Onset  . Coronary artery disease Mother   . Hypertension Mother   . Anxiety disorder Mother   . Arthritis Mother     rheumatoid  . Cancer Father     possible prostate cancer  . Diabetes Sister     History  Substance Use Topics  . Smoking status: Never Smoker   . Smokeless tobacco: Never Used  . Alcohol Use: No    No OB history provided.  Review of Systems  HENT: Positive for neck pain and neck stiffness.     Allergies  Sulfa antibiotics and Penicillins  Home Medications   Current Outpatient Rx  Name Route Sig Dispense  Refill  . ABACAVIR SULFATE 300 MG PO TABS Oral Take 1 tablet (300 mg total) by mouth 2 (two) times daily. 60 tablet 11  . ACYCLOVIR 5 % EX OINT Topical Apply topically every 3 (three) hours. 30 g 3  . ATAZANAVIR SULFATE 300 MG PO CAPS Oral Take 1 capsule (300 mg total) by mouth daily. 30 capsule 11  . HYDROCHLOROTHIAZIDE 25 MG PO TABS Oral Take 1 tablet (25 mg total) by mouth daily. 30 tablet 6  . RITONAVIR 100 MG PO TABS Oral Take 1 tablet (100 mg total) by mouth daily. 30 tablet 11  . TENOFOVIR DISOPROXIL FUMARATE 300 MG PO TABS Oral Take 1 tablet (300 mg total) by mouth daily. 30 tablet 11    BP 151/90  Pulse 92  Temp 98.5 F (36.9 C) (Oral)  Resp 20  Ht 5\' 1"  (1.549 m)  Wt 183 lb (83.008 kg)  BMI 34.58 kg/m2  SpO2 98%  Physical Exam  Nursing note and vitals reviewed. Constitutional: She is oriented to person, place, and time. She appears well-developed.  Neck: No tracheal deviation present. No thyromegaly present.       Tenderness in left-side neck.  Tenderness in cervical spine.  Pain increased with bilateral rotation.  Lymphadenopathy:    She has no cervical adenopathy.  Neurological: She is alert and oriented to person, place, and time. No cranial nerve deficit.  Psychiatric: She has a normal mood  and affect.    ED Course  Procedures (including critical care time) DIAGNOSTIC STUDIES: Oxygen Saturation is 98% on room air, normal by my interpretation.    COORDINATION OF CARE: 3:22PM- Ordered IV fluids, morphine, zofran, neck CT, b-met, and CBC.  Discussed transfer.    Labs Reviewed  BASIC METABOLIC PANEL  CBC WITH DIFFERENTIAL   No results found.   No diagnosis found.    MDM  Neck pain HIV + I personally performed the services described in this documentation, which was scribed in my presence. The recorded information has been reviewed and considered.         Cheri Guppy, MD 12/09/11 [phone]

## 2011-12-09 NOTE — ED Notes (Signed)
PT IS ABLE TO LIFT HEAD OFF PILLOW AND TURN HEAD WITHOUT GRIMACE TO SHOW WHERE SHE IS HAVING PAIN. DENIES RECENT COUGH OR COLD SX. DENIES FEVER. DENIES N/V. LIGHT DOES NOT BOTHER PT EYES.

## 2011-12-09 NOTE — ED Notes (Signed)
PT HAS ARRIVED IN CDU FROM CT 

## 2011-12-09 NOTE — ED Notes (Signed)
PT TO COME TO CDU AFTER CT

## 2011-12-09 NOTE — ED Provider Notes (Signed)
Pt placed in CDU to await for CT scan of neck. Pt does not have any systemic symptoms of neck pain and admits that she has been sleeping on the floor as to be closer to the air conditions. Denies injury or headache. I believe her pain to be muscular in pathology. Will give 125 mg solumedrol IV as well as Vicodin and Flexeril. Will give ortho referral.  Results for orders placed during the hospital encounter of 12/09/11  BASIC METABOLIC PANEL      Component Value Range   Sodium 140  135 - 145 mEq/L   Potassium 3.0 (*) 3.5 - 5.1 mEq/L   Chloride 103  96 - 112 mEq/L   CO2 29  19 - 32 mEq/L   Glucose, Bld 88  70 - 99 mg/dL   BUN 9  6 - 23 mg/dL   Creatinine, Ser 3.08  0.50 - 1.10 mg/dL   Calcium 9.7  8.4 - 65.7 mg/dL   GFR calc non Af Amer >90  >90 mL/min   GFR calc Af Amer >90  >90 mL/min  CBC WITH DIFFERENTIAL      Component Value Range   WBC 4.8  4.0 - 10.5 K/uL   RBC 4.43  3.87 - 5.11 MIL/uL   Hemoglobin 13.6  12.0 - 15.0 g/dL   HCT 84.6  96.2 - 95.2 %   MCV 89.6  78.0 - 100.0 fL   MCH 30.7  26.0 - 34.0 pg   MCHC 34.3  30.0 - 36.0 g/dL   RDW 84.1  32.4 - 40.1 %   Platelets 219  150 - 400 K/uL   Neutrophils Relative 54  43 - 77 %   Neutro Abs 2.6  1.7 - 7.7 K/uL   Lymphocytes Relative 35  12 - 46 %   Lymphs Abs 1.7  0.7 - 4.0 K/uL   Monocytes Relative 8  3 - 12 %   Monocytes Absolute 0.4  0.1 - 1.0 K/uL   Eosinophils Relative 3  0 - 5 %   Eosinophils Absolute 0.1  0.0 - 0.7 K/uL   Basophils Relative 0  0 - 1 %   Basophils Absolute 0.0  0.0 - 0.1 K/uL   Ct Soft Tissue Neck W Contrast  12/09/2011  *RADIOLOGY REPORT*  Clinical Data: Sudden onset neck pain and stiffness this morning, worsening.  CT NECK WITH CONTRAST  Technique:  Multidetector CT imaging of the neck was performed with intravenous contrast.  Contrast: 75mL OMNIPAQUE IOHEXOL 300 MG/ML  SOLN  Comparison: Soft tissue neck dated 02/13/2008.  Findings: Mild reversal of the normal cervical lordosis.  Anterior disc space  narrowing and spur formation at the C5-6 level.  Normal appearing soft tissues.  No masses, enlarged lymph nodes or fluid collections.  Clear lung apices.  IMPRESSION: No acute abnormality.   Original Report Authenticated By: Darrol Angel, M.D.        Pt has been advised of the symptoms that warrant their return to the ED. Patient has voiced understanding and has agreed to follow-up with the PCP or specialist.    Dorthula Matas, PA 12/09/11 1725

## 2011-12-10 NOTE — ED Provider Notes (Signed)
Medical screening examination/treatment/procedure(s) were conducted as a shared visit with non-physician practitioner(s) and myself.  I personally evaluated the patient during the encounter  Cheri Guppy, MD 12/10/11 [phone]

## 2011-12-11 ENCOUNTER — Telehealth: Payer: Self-pay | Admitting: *Deleted

## 2011-12-11 DIAGNOSIS — A6000 Herpesviral infection of urogenital system, unspecified: Secondary | ICD-10-CM

## 2011-12-11 MED ORDER — ACYCLOVIR 5 % EX OINT: TOPICAL_OINTMENT | CUTANEOUS | Status: DC

## 2011-12-11 NOTE — Telephone Encounter (Signed)
Left message requesting pt call back to let [facility] know whether she is having genital herpes outbreak so that Dr. Orvan Falconer can be notified and refill authorization obtained.  RX was not on current medication list.

## 2011-12-12 ENCOUNTER — Other Ambulatory Visit: Payer: Self-pay | Admitting: Licensed Clinical Social Worker

## 2011-12-12 DIAGNOSIS — A6000 Herpesviral infection of urogenital system, unspecified: Secondary | ICD-10-CM

## 2011-12-12 MED ORDER — VALACYCLOVIR HCL 500 MG PO TABS: 500.0000 mg | ORAL_TABLET | Freq: Three times a day (TID) | ORAL | Status: DC

## 2011-12-19 ENCOUNTER — Ambulatory Visit: Payer: Medicare Other | Admitting: Internal Medicine

## 2012-01-02 ENCOUNTER — Encounter: Payer: Self-pay | Admitting: Internal Medicine

## 2012-02-13 ENCOUNTER — Other Ambulatory Visit (INDEPENDENT_AMBULATORY_CARE_PROVIDER_SITE_OTHER): Payer: Self-pay

## 2012-02-13 DIAGNOSIS — B20 Human immunodeficiency virus [HIV] disease: Secondary | ICD-10-CM

## 2012-02-14 LAB — HIV-1 RNA QUANT-NO REFLEX-BLD
HIV 1 RNA Quant: <20 copies/mL (ref <20)
HIV-1 RNA Quant, Log: <1.3 {Log} (ref <1.30)

## 2012-02-27 ENCOUNTER — Encounter: Payer: Self-pay | Admitting: Internal Medicine

## 2012-02-27 ENCOUNTER — Ambulatory Visit (INDEPENDENT_AMBULATORY_CARE_PROVIDER_SITE_OTHER): Payer: Self-pay | Admitting: Internal Medicine

## 2012-02-27 VITALS — BP 177/77 | HR 80 | Temp 98.1°F | Ht 61.0 in | Wt 191.5 lb

## 2012-02-27 DIAGNOSIS — B20 Human immunodeficiency virus [HIV] disease: Secondary | ICD-10-CM

## 2012-02-27 DIAGNOSIS — Z23 Encounter for immunization: Secondary | ICD-10-CM

## 2012-02-27 NOTE — Progress Notes (Signed)
Patient ID: [name] [name], female   DOB: 08-01-1955, 56 y.o.   MRN: [id number]     [facility] for Infectious Disease  Patient Active Problem List  Diagnosis  . HIV INFECTION  . VIRAL MENINGITIS  . GENITAL HERPES  . LESION, VAGINA  . ANEMIA  . DEPENDENCE, COCAINE, UNSPECIFIED  . DEPRESSION  . HYPERTENSION  . ALLERGIC RHINITIS  . DENTAL CARIES  . GERD  . GROSS HEMATURIA  . PREGNANCY, ECTOPIC NOS W/O INTRAUTERINE PRG  . ABORTION NOS W/PELVIC DAMAGE, UNSPECIFIED  . DEGENERATIVE JOINT DISEASE  . LOW BACK PAIN, ACUTE  . SKIN RASH  . PAPANICOLAOU SMEAR OF VAGINA WITH LGSIL  . ANGIOEDEMA  . BUNIONECTOMY, HX OF  . POSTPROCEDURAL STATUS NEC  . Rectal bleeding    Patient's Medications  New Prescriptions   No medications on file  Previous Medications   ABACAVIR (ZIAGEN) 300 MG TABLET    Take 1 tablet (300 mg total) by mouth 2 (two) times daily.   ACYCLOVIR OINTMENT (ZOVIRAX) 5 %    Apply topically every 3 (three) hours.   ATAZANAVIR (REYATAZ) 300 MG CAPSULE    Take 1 capsule (300 mg total) by mouth daily.   HYDROCHLOROTHIAZIDE (HYDRODIURIL) 25 MG TABLET    Take 1 tablet (25 mg total) by mouth daily.   IBUPROFEN (ADVIL,MOTRIN) 200 MG TABLET    Take 200 mg by mouth every 6 (six) hours as needed.   RITONAVIR (NORVIR) 100 MG TABS    Take 1 tablet (100 mg total) by mouth daily.   TENOFOVIR (VIREAD) 300 MG TABLET    Take 1 tablet (300 mg total) by mouth daily.   VALACYCLOVIR (VALTREX) 500 MG TABLET    Take 1 tablet (500 mg total) by mouth 3 (three) times daily. Instead of the 1000 mg tabs  Modified Medications   No medications on file  Discontinued Medications   CYCLOBENZAPRINE (FLEXERIL) 10 MG TABLET    Take 1 tablet (10 mg total) by mouth 2 (two) times daily as needed for muscle spasms.   HYDROCODONE-ACETAMINOPHEN (VICODIN) 5-500 MG PER TABLET    Take 1-2 tablets by mouth every 6 (six) hours as needed for pain.    Subjective: [name] is in for her routine visit. She states she  is feeling better. She has done much better taking her medications over the past year. She recalls missing only one dose since her last visit when she fell asleep early and was too tired to get up even with her cell phone alarm went off. She is not as concerned about potential memory loss after seeing my partner, Dr. Drue Second recently. She admits that she does on occasion feel anxious but does not feel that this is a significant problem and is not interested in counseling for that. She is extremely happy to have her dentures now.  Objective: Temp: 98.1 F (36.7 C) (12/19 1051) Temp src: Oral (12/19 1051) BP: 177/77 mmHg (12/19 1051) Pulse Rate: 80  (12/19 1051)  General: She is in good spirits Oral: Dentures present Skin: No rash Lungs: Clear Cor: Regular S1 and S2 with no murmurs  Lab Results HIV 1 RNA Quant (copies/mL)  Date Value  02/13/2012 <20   12/05/2011 23*  08/27/2011 <20      HIV-1 RNA Viral Load (no units)  Date Value  10/22/2011 <40   04/15/2011 <40   11/23/2010 <40      CD4 T Cell Abs (cmm)  Date Value  02/13/2012 330*  12/05/2011 250*  08/27/2011 230*     Assessment: Her HIV infection has come under much better control the last 2 years do to improve the appearance in she has had excellent CD4 reconstitution.  Plan: 1. Continue current antiretroviral medications 2. Influenza vaccination today 3. Followup after lab work in 6 months   Cliffton Asters, MD [facility] for Infectious Disease [facility] [phone] pager   [phone] cell 02/27/2012, 11:10 AM

## 2012-03-08 ENCOUNTER — Other Ambulatory Visit: Payer: Self-pay | Admitting: Internal Medicine

## 2012-04-01 ENCOUNTER — Other Ambulatory Visit: Payer: Self-pay | Admitting: *Deleted

## 2012-04-01 DIAGNOSIS — A6009 Herpesviral infection of other urogenital tract: Secondary | ICD-10-CM

## 2012-04-01 MED ORDER — VALACYCLOVIR HCL 500 MG PO TABS: 500.0000 mg | ORAL_TABLET | Freq: Three times a day (TID) | ORAL | Status: DC

## 2012-04-12 ENCOUNTER — Emergency Department (HOSPITAL_COMMUNITY): Payer: Self-pay

## 2012-04-12 ENCOUNTER — Encounter (HOSPITAL_COMMUNITY): Payer: Self-pay | Admitting: Emergency Medicine

## 2012-04-12 ENCOUNTER — Emergency Department (HOSPITAL_COMMUNITY)
Admission: EM | Admit: 2012-04-12 | Discharge: 2012-04-12 | Disposition: A | Discharge status: Home / Self Care | Payer: Self-pay | Attending: Emergency Medicine | Admitting: Emergency Medicine

## 2012-04-12 DIAGNOSIS — S058X9A Other injuries of unspecified eye and orbit, initial encounter: Secondary | ICD-10-CM | POA: Insufficient documentation

## 2012-04-12 DIAGNOSIS — Z8719 Personal history of other diseases of the digestive system: Secondary | ICD-10-CM | POA: Insufficient documentation

## 2012-04-12 DIAGNOSIS — S0500XA Injury of conjunctiva and corneal abrasion without foreign body, unspecified eye, initial encounter: Secondary | ICD-10-CM

## 2012-04-12 DIAGNOSIS — I10 Essential (primary) hypertension: Secondary | ICD-10-CM | POA: Insufficient documentation

## 2012-04-12 DIAGNOSIS — Z21 Asymptomatic human immunodeficiency virus [HIV] infection status: Secondary | ICD-10-CM | POA: Insufficient documentation

## 2012-04-12 DIAGNOSIS — H53149 Visual discomfort, unspecified: Secondary | ICD-10-CM | POA: Insufficient documentation

## 2012-04-12 DIAGNOSIS — H5789 Other specified disorders of eye and adnexa: Secondary | ICD-10-CM | POA: Insufficient documentation

## 2012-04-12 MED ORDER — FLUORESCEIN SODIUM 1 MG OP STRP
1.0000 | ORAL_STRIP | Freq: Once | OPHTHALMIC | Status: AC
  Administered 2012-04-12: 15:00:00 via OPHTHALMIC
  Filled 2012-04-12: qty 1

## 2012-04-12 MED ORDER — OXYCODONE-ACETAMINOPHEN 5-325 MG PO TABS
2.0000 | ORAL_TABLET | Freq: Once | ORAL | Status: DC
  Filled 2012-04-12: qty 2

## 2012-04-12 MED ORDER — TETRACAINE HCL 0.5 % OP SOLN
2.0000 [drp] | Freq: Once | OPHTHALMIC | Status: AC
  Administered 2012-04-12: 2 [drp] via OPHTHALMIC
  Filled 2012-04-12: qty 2

## 2012-04-12 MED ORDER — ERYTHROMYCIN 5 MG/GM OP OINT
1.0000 "application " | TOPICAL_OINTMENT | Freq: Once | OPHTHALMIC | Status: AC
  Administered 2012-04-12: 1 via OPHTHALMIC
  Filled 2012-04-12: qty 1

## 2012-04-12 MED ORDER — OXYCODONE-ACETAMINOPHEN 5-325 MG PO TABS: ORAL_TABLET | ORAL | Status: DC

## 2012-04-12 MED ORDER — NAPROXEN 250 MG PO TABS
500.0000 mg | ORAL_TABLET | Freq: Once | ORAL | Status: AC
  Administered 2012-04-12: 500 mg via ORAL
  Filled 2012-04-12: qty 2
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
## 2012-04-12 NOTE — ED Notes (Signed)
Pt. Stated, I started to have lt. Eye pain on Friday

## 2012-04-12 NOTE — ED Notes (Signed)
Onset 2 days ago left eye pain and pink. Continued today pain currently 10/10 soreness with slight blurry left eye.

## 2012-04-15 NOTE — ED Provider Notes (Signed)
Medical screening examination/treatment/procedure(s) were performed by non-physician practitioner and as supervising physician I was immediately available for consultation/collaboration.   Gavin Pound. Katelin Kutsch, MD 04/15/12 1441

## 2012-04-29 ENCOUNTER — Ambulatory Visit (INDEPENDENT_AMBULATORY_CARE_PROVIDER_SITE_OTHER): Payer: Self-pay | Admitting: *Deleted

## 2012-04-29 VITALS — BP 155/91 | Ht 60.75 in | Wt 186.5 lb

## 2012-04-29 DIAGNOSIS — Z21 Asymptomatic human immunodeficiency virus [HIV] infection status: Secondary | ICD-10-CM

## 2012-04-29 LAB — CBC WITH DIFFERENTIAL/PLATELET
Eosinophils Relative: 2 % (ref 0–5)
HCT: 37.1 % (ref 36.0–46.0)
Hemoglobin: 12.6 g/dL (ref 12.0–15.0)
Lymphocytes Relative: 33 % (ref 12–46)
Lymphs Abs: 1.3 10*3/uL (ref 0.7–4.0)
MCV: 86.9 fL (ref 78.0–100.0)
Monocytes Relative: 7 % (ref 3–12)
Platelets: 223 10*3/uL (ref 150–400)
RBC: 4.27 MIL/uL (ref 3.87–5.11)
WBC: 4 10*3/uL (ref 4.0–10.5)

## 2012-04-29 LAB — LIPID PANEL
Cholesterol: 208 mg/dL — ABNORMAL HIGH (ref 0–200)
HDL: 57 mg/dL (ref >39)
Total CHOL/HDL Ratio: 3.6 Ratio
Triglycerides: 105 mg/dL (ref <150)
VLDL: 21 mg/dL (ref 0–40)

## 2012-04-29 LAB — COMPREHENSIVE METABOLIC PANEL
Alkaline Phosphatase: 105 U/L (ref 39–117)
CO2: 30 mEq/L (ref 19–32)
Creat: 0.59 mg/dL (ref 0.50–1.10)
Glucose, Bld: 79 mg/dL (ref 70–99)
Total Bilirubin: 2.8 mg/dL — ABNORMAL HIGH (ref 0.3–1.2)

## 2012-04-29 LAB — HEPATITIS B SURFACE ANTIBODY,QUALITATIVE: Hep B S Ab: NONREACTIVE

## 2012-04-29 NOTE — Progress Notes (Signed)
Patient enrolled in  [facility]  today, an observational study looking at HIV and Aging. We obtained informed consent after discussion of the study with her. She denies any new problems, but does say she still feels like she is having some memory problems. She has occassional soreness and numbness in her left foot, nasal drainage and hot flashes. She will see dr. Orvan Falconer again in June and the next study appt will be in August.

## 2012-05-11 ENCOUNTER — Other Ambulatory Visit: Payer: Self-pay | Admitting: Internal Medicine

## 2012-05-11 ENCOUNTER — Ambulatory Visit (INDEPENDENT_AMBULATORY_CARE_PROVIDER_SITE_OTHER): Payer: Self-pay | Admitting: *Deleted

## 2012-05-11 DIAGNOSIS — Z0000 Encounter for general adult medical examination without abnormal findings: Secondary | ICD-10-CM

## 2012-05-11 NOTE — Progress Notes (Signed)
  Subjective:     [name] [name] is a 57 y.o. woman who comes in today for a  pap smear only. Previous abnormal Pap smears: no. Contraception: condoms, menopausal  Pelvic Exam:  Pap smear obtained.   Assessment:    Screening pap smear.  Creamy, white vaginal discharge.  Pt c/o vaginal soreness when attempting to obtain PAP smear.  Only able to obtain vaginal specimen.  Will need to repeat PAP smear at later date.    Plan:    Follow up in one year, or as indicated by Pap results.  Pt given educational materials re:  HIV and heart disease, BSE, partner protection, PAP smears, ACA and medication adherence.  Pt given condoms.

## 2012-05-11 NOTE — Patient Instructions (Addendum)
Your results will be ready in about a week.  I will mail them to you.  Thank you for coming to the [facility] for your care.  Denise,  RN 

## 2012-05-14 ENCOUNTER — Encounter: Payer: Self-pay | Admitting: *Deleted

## 2012-05-19 ENCOUNTER — Ambulatory Visit: Payer: Self-pay

## 2012-06-08 ENCOUNTER — Telehealth: Payer: Self-pay | Admitting: *Deleted

## 2012-06-08 NOTE — Telephone Encounter (Signed)
Discussed normal pap smear and testing for vaginal infections.  Everything was normal.  Pt verbalized understanding.

## 2012-06-15 ENCOUNTER — Ambulatory Visit (HOSPITAL_COMMUNITY): Payer: Self-pay

## 2012-06-18 ENCOUNTER — Ambulatory Visit (HOSPITAL_COMMUNITY)
Admission: RE | Admit: 2012-06-18 | Discharge: 2012-06-18 | Disposition: A | Discharge status: Home / Self Care | Payer: Self-pay | Source: Ambulatory Visit | Attending: Internal Medicine | Admitting: Internal Medicine

## 2012-06-18 DIAGNOSIS — Z0000 Encounter for general adult medical examination without abnormal findings: Secondary | ICD-10-CM

## 2012-06-18 DIAGNOSIS — Z1231 Encounter for screening mammogram for malignant neoplasm of breast: Secondary | ICD-10-CM

## 2012-06-25 ENCOUNTER — Emergency Department (HOSPITAL_COMMUNITY)
Admission: EM | Admit: 2012-06-25 | Discharge: 2012-06-25 | Disposition: A | Discharge status: Home / Self Care | Payer: Self-pay | Attending: Emergency Medicine | Admitting: Emergency Medicine

## 2012-06-25 ENCOUNTER — Encounter (HOSPITAL_COMMUNITY): Payer: Self-pay | Admitting: Emergency Medicine

## 2012-06-25 ENCOUNTER — Emergency Department (HOSPITAL_COMMUNITY): Payer: Self-pay

## 2012-06-25 DIAGNOSIS — I10 Essential (primary) hypertension: Secondary | ICD-10-CM | POA: Insufficient documentation

## 2012-06-25 DIAGNOSIS — IMO0002 Reserved for concepts with insufficient information to code with codable children: Secondary | ICD-10-CM | POA: Insufficient documentation

## 2012-06-25 DIAGNOSIS — W1809XA Striking against other object with subsequent fall, initial encounter: Secondary | ICD-10-CM | POA: Insufficient documentation

## 2012-06-25 DIAGNOSIS — K219 Gastro-esophageal reflux disease without esophagitis: Secondary | ICD-10-CM | POA: Insufficient documentation

## 2012-06-25 DIAGNOSIS — Y9289 Other specified places as the place of occurrence of the external cause: Secondary | ICD-10-CM | POA: Insufficient documentation

## 2012-06-25 DIAGNOSIS — Z79899 Other long term (current) drug therapy: Secondary | ICD-10-CM | POA: Insufficient documentation

## 2012-06-25 DIAGNOSIS — S20212A Contusion of left front wall of thorax, initial encounter: Secondary | ICD-10-CM

## 2012-06-25 DIAGNOSIS — Z21 Asymptomatic human immunodeficiency virus [HIV] infection status: Secondary | ICD-10-CM | POA: Insufficient documentation

## 2012-06-25 DIAGNOSIS — S20219A Contusion of unspecified front wall of thorax, initial encounter: Secondary | ICD-10-CM | POA: Insufficient documentation

## 2012-06-25 DIAGNOSIS — Y9389 Activity, other specified: Secondary | ICD-10-CM | POA: Insufficient documentation

## 2012-06-25 LAB — POCT I-STAT TROPONIN I

## 2012-06-25 LAB — BASIC METABOLIC PANEL
BUN: 8 mg/dL (ref 6–23)
CO2: 32 mEq/L (ref 19–32)
Calcium: 9.1 mg/dL (ref 8.4–10.5)
Creatinine, Ser: 0.77 mg/dL (ref 0.50–1.10)
GFR calc non Af Amer: >90 mL/min (ref >90)
Glucose, Bld: 106 mg/dL — ABNORMAL HIGH (ref 70–99)
Sodium: 140 mEq/L (ref 135–145)

## 2012-06-25 LAB — CBC
HCT: 36.2 % (ref 36.0–46.0)
Hemoglobin: 12.4 g/dL (ref 12.0–15.0)
MCH: 29.8 pg (ref 26.0–34.0)
MCHC: 34.3 g/dL (ref 30.0–36.0)
MCV: 87 fL (ref 78.0–100.0)
RBC: 4.16 MIL/uL (ref 3.87–5.11)

## 2012-06-25 MED ORDER — POTASSIUM CHLORIDE CRYS ER 20 MEQ PO TBCR: 20.0000 meq | EXTENDED_RELEASE_TABLET | Freq: Two times a day (BID) | ORAL | Status: DC

## 2012-06-25 MED ORDER — OXYCODONE-ACETAMINOPHEN 5-325 MG PO TABS
1.0000 | ORAL_TABLET | Freq: Once | ORAL | Status: AC
  Administered 2012-06-25: 1 via ORAL
  Filled 2012-06-25: qty 1

## 2012-06-25 MED ORDER — POTASSIUM CHLORIDE CRYS ER 20 MEQ PO TBCR
40.0000 meq | EXTENDED_RELEASE_TABLET | Freq: Once | ORAL | Status: AC
  Administered 2012-06-25: 40 meq via ORAL
  Filled 2012-06-25: qty 2

## 2012-06-25 MED ORDER — OXYCODONE-ACETAMINOPHEN 5-325 MG PO TABS: 1.0000 | ORAL_TABLET | ORAL | Status: DC | PRN

## 2012-06-25 NOTE — ED Provider Notes (Addendum)
Medical screening examination/treatment/procedure(s) were performed by non-physician practitioner and as supervising physician I was immediately available for consultation/collaboration.   Dione Booze, MD 06/25/12 [phone]
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
## 2012-06-25 NOTE — ED Notes (Signed)
Patient transported to X-ray 

## 2012-06-25 NOTE — ED Notes (Signed)
Pt c/o left sided CP after falling on that side yesterday; pt sts pain worse with movement and palpation

## 2012-07-06 ENCOUNTER — Other Ambulatory Visit: Payer: Self-pay | Admitting: Internal Medicine

## 2012-08-08 ENCOUNTER — Other Ambulatory Visit: Payer: Self-pay | Admitting: Internal Medicine

## 2012-08-10 ENCOUNTER — Other Ambulatory Visit: Payer: Self-pay

## 2012-08-10 DIAGNOSIS — B20 Human immunodeficiency virus [HIV] disease: Secondary | ICD-10-CM

## 2012-08-10 MED ORDER — RITONAVIR 100 MG PO TABS: 100.0000 mg | ORAL_TABLET | Freq: Every day | ORAL | Status: DC

## 2012-08-10 MED ORDER — TENOFOVIR DISOPROXIL FUMARATE 300 MG PO TABS: 300.0000 mg | ORAL_TABLET | Freq: Every day | ORAL | Status: DC

## 2012-08-10 MED ORDER — ATAZANAVIR SULFATE 300 MG PO CAPS: 300.0000 mg | ORAL_CAPSULE | Freq: Every day | ORAL | Status: DC

## 2012-08-24 ENCOUNTER — Other Ambulatory Visit (INDEPENDENT_AMBULATORY_CARE_PROVIDER_SITE_OTHER): Payer: Medicare Other

## 2012-08-24 DIAGNOSIS — B20 Human immunodeficiency virus [HIV] disease: Secondary | ICD-10-CM

## 2012-08-24 LAB — COMPREHENSIVE METABOLIC PANEL
Albumin: 3.9 g/dL (ref 3.5–5.2)
Alkaline Phosphatase: 105 U/L (ref 39–117)
BUN: 10 mg/dL (ref 6–23)
CO2: 29 mEq/L (ref 19–32)
Calcium: 9.1 mg/dL (ref 8.4–10.5)
Chloride: 105 mEq/L (ref 96–112)
Glucose, Bld: 67 mg/dL — ABNORMAL LOW (ref 70–99)
Potassium: 3.2 mEq/L — ABNORMAL LOW (ref 3.5–5.3)
Sodium: 142 mEq/L (ref 135–145)
Total Protein: 7.8 g/dL (ref 6.0–8.3)

## 2012-08-24 LAB — CBC
HCT: 37.3 % (ref 36.0–46.0)
MCHC: 34.6 g/dL (ref 30.0–36.0)
MCV: 86.7 fL (ref 78.0–100.0)
RBC: 4.3 MIL/uL (ref 3.87–5.11)
RDW: 13.9 % (ref 11.5–15.5)

## 2012-09-05 ENCOUNTER — Encounter (HOSPITAL_COMMUNITY): Payer: Self-pay | Admitting: Physical Medicine and Rehabilitation

## 2012-09-05 ENCOUNTER — Emergency Department (HOSPITAL_COMMUNITY)
Admission: EM | Admit: 2012-09-05 | Discharge: 2012-09-05 | Disposition: A | Discharge status: Home / Self Care | Payer: Self-pay | Attending: Emergency Medicine | Admitting: Emergency Medicine

## 2012-09-05 DIAGNOSIS — Z8719 Personal history of other diseases of the digestive system: Secondary | ICD-10-CM | POA: Insufficient documentation

## 2012-09-05 DIAGNOSIS — L0291 Cutaneous abscess, unspecified: Secondary | ICD-10-CM

## 2012-09-05 DIAGNOSIS — IMO0002 Reserved for concepts with insufficient information to code with codable children: Secondary | ICD-10-CM | POA: Insufficient documentation

## 2012-09-05 DIAGNOSIS — Z21 Asymptomatic human immunodeficiency virus [HIV] infection status: Secondary | ICD-10-CM | POA: Insufficient documentation

## 2012-09-05 DIAGNOSIS — I10 Essential (primary) hypertension: Secondary | ICD-10-CM | POA: Insufficient documentation

## 2012-09-05 DIAGNOSIS — Z880 Allergy status to penicillin: Secondary | ICD-10-CM | POA: Insufficient documentation

## 2012-09-05 MED ORDER — HYDROCODONE-ACETAMINOPHEN 5-325 MG PO TABS
1.0000 | ORAL_TABLET | Freq: Once | ORAL | Status: AC
  Administered 2012-09-05: 1 via ORAL
  Filled 2012-09-05: qty 1

## 2012-09-05 MED ORDER — HYDROCODONE-ACETAMINOPHEN 5-325 MG PO TABS: ORAL_TABLET | ORAL | Status: DC

## 2012-09-05 MED ORDER — CLINDAMYCIN HCL 150 MG PO CAPS: 300.0000 mg | ORAL_CAPSULE | Freq: Four times a day (QID) | ORAL | Status: DC

## 2012-09-05 NOTE — ED Notes (Signed)
Pt presents to department for evaluation of R axillary abscess. Ongoing x1 week. Pt states red, raised and tender area under arm. No drainage noted.

## 2012-09-05 NOTE — ED Provider Notes (Signed)
History    CSN: [id number] Arrival date & time 09/05/12  1054  First MD Initiated Contact with Patient 09/05/12 1118     Chief Complaint  Patient presents with  . Abscess   (Consider location/radiation/quality/duration/timing/severity/associated sxs/prior Treatment) HPI Comments: Patient presents with complaint of right axillary abscess for the past 5 days. The area is red and tender. It has not been draining. She denies fever. She does have a history of HIV, last CD4 count in the 300s per the patient. She's been using warm compresses without relief. Onset of symptoms gradual. Course is gradually worsening. Nothing makes symptoms better.  Patient is a 57 y.o. female presenting with abscess. The history is provided by the patient.  Abscess Associated symptoms: no fever, no nausea and no vomiting    Past Medical History  Diagnosis Date  . HIV (human immunodeficiency virus infection) 2007  . Hypertension   . GERD (gastroesophageal reflux disease)    Past Surgical History  Procedure Laterality Date  . Ectopic pregnancy surgery      s/p right salpinooophrectomy  . Bunionectomy    . Uterine fibroid surgery     Family History  Problem Relation Age of Onset  . Coronary artery disease Mother   . Hypertension Mother   . Anxiety disorder Mother   . Arthritis Mother     rheumatoid  . Cancer Father     possible prostate cancer  . Diabetes Sister    History  Substance Use Topics  . Smoking status: Never Smoker   . Smokeless tobacco: Never Used  . Alcohol Use: No   OB History   Grav Para Term Preterm Abortions TAB SAB Ect Mult Living                 Review of Systems  Constitutional: Negative for fever.  Gastrointestinal: Negative for nausea and vomiting.  Skin: Negative for color change.       Positive for abscess.  Hematological: Negative for adenopathy.    Allergies  Sulfa antibiotics and Penicillins  Home Medications   Current Outpatient Rx  Name  Route  Sig   Dispense  Refill  . abacavir (ZIAGEN) 300 MG tablet   Oral   Take 1 tablet (300 mg total) by mouth 2 (two) times daily.   60 tablet   11   . atazanavir (REYATAZ) 300 MG capsule   Oral   Take 1 capsule (300 mg total) by mouth daily.   30 capsule   11   . ritonavir (NORVIR) 100 MG TABS   Oral   Take 1 tablet (100 mg total) by mouth daily.   30 tablet   11   . tenofovir (VIREAD) 300 MG tablet   Oral   Take 1 tablet (300 mg total) by mouth daily.   30 tablet   11    BP 155/76  Pulse 78  Temp(Src) 98.1 F (36.7 C) (Oral)  Resp 18  SpO2 98% Physical Exam  Nursing note and vitals reviewed. Constitutional: She appears well-developed and well-nourished.  HENT:  Head: Normocephalic and atraumatic.  Eyes: Conjunctivae are normal.  Neck: Normal range of motion. Neck supple.  Pulmonary/Chest: No respiratory distress.  Neurological: She is alert.  Skin: Skin is warm and dry.  2 cm x 2 cm area of induration and overlying erythema without signs of significant cellulitis in the right axilla consistent with abscess.  Psychiatric: She has a normal mood and affect.    ED Course  Procedures (including  critical care time) Labs Reviewed - No data to display No results found. 1. Abscess     12:18 PM Patient seen and examined.   Vital signs reviewed and are as follows: Filed Vitals:   09/05/12 1056  BP: 155/76  Pulse: 78  Temp: 98.1 F (36.7 C)  Resp: 18   INCISION AND DRAINAGE Performed by: Carolee Rota Consent: Verbal consent obtained. Risks and benefits: risks, benefits and alternatives were discussed Type: abscess  Body area: R axilla  Anesthesia: local infiltration  Incision was made with a scalpel.  Local anesthetic: lidocaine 2% with epinephrine  Anesthetic total: 2 ml  Complexity: complex Blunt dissection to break up loculations  Drainage: purulent  Drainage amount: moderate  Packing material: none  Patient tolerance: Patient tolerated the  procedure well with no immediate complications.  12:19 PM The patient was urged to return to the Emergency Department urgently with worsening pain, swelling, expanding erythema especially if it streaks away from the affected area, fever, or if they have any other concerns.   The patient was urged to return to the Emergency Department or go to their PCP in 48 hours for wound recheck if the area is not significantly improved.  Pt to fill clinda if not improving/worsening.   The patient verbalized understanding and stated agreement with this plan.       MDM  Patient with HIV and abscess. Uncomplicated drainage. Antibiotics given if worsening or no improvement. No systemic sx to indicate need for hospitalization at this time. Pt appears well, non-toxic.   Renne Crigler, PA-C 09/05/12 1233

## 2012-09-05 NOTE — ED Provider Notes (Signed)
Medical screening examination/treatment/procedure(s) were performed by non-physician practitioner and as supervising physician I was immediately available for consultation/collaboration. Devoria Albe, MD, Armando Gang   Ward Givens, MD 09/05/12 1309

## 2012-09-07 ENCOUNTER — Encounter: Payer: Self-pay | Admitting: Internal Medicine

## 2012-09-07 ENCOUNTER — Ambulatory Visit (INDEPENDENT_AMBULATORY_CARE_PROVIDER_SITE_OTHER): Payer: Medicare Other | Admitting: Internal Medicine

## 2012-09-07 VITALS — BP 164/91 | HR 74 | Temp 98.2°F | Ht 61.0 in | Wt 182.5 lb

## 2012-09-07 DIAGNOSIS — B20 Human immunodeficiency virus [HIV] disease: Secondary | ICD-10-CM

## 2012-09-07 NOTE — Progress Notes (Signed)
Patient ID: [name] [name], female   DOB: 05-08-1955, 57 y.o.   MRN: [id number]          [facility]  Patient Active Problem List   Diagnosis Date Noted  . HIV INFECTION 08/12/2006    Priority: High  . Rectal bleeding 06/26/2010  . LOW BACK PAIN, ACUTE 01/16/2010  . LESION, VAGINA 10/05/2009  . HYPERTENSION 03/29/2009  . PAPANICOLAOU SMEAR OF VAGINA WITH LGSIL 01/25/2009  . VIRAL MENINGITIS 09/14/2008  . SKIN RASH 09/14/2008  . DEPRESSION 03/22/2008  . DENTAL CARIES 09/21/2007  . GROSS HEMATURIA 08/26/2007  . DEGENERATIVE JOINT DISEASE 08/12/2006  . GENITAL HERPES 01/13/2006  . ANEMIA 01/13/2006  . DEPENDENCE, COCAINE, UNSPECIFIED 01/13/2006  . ALLERGIC RHINITIS 01/13/2006  . GERD 01/13/2006  . PREGNANCY, ECTOPIC NOS W/O INTRAUTERINE PRG 01/13/2006  . ABORTION NOS W/PELVIC DAMAGE, UNSPECIFIED 01/13/2006  . ANGIOEDEMA 01/13/2006  . BUNIONECTOMY, HX OF 01/13/2006  . POSTPROCEDURAL STATUS NEC 01/13/2006    Patient's Medications  New Prescriptions   No medications on file  Previous Medications   ABACAVIR (ZIAGEN) 300 MG TABLET    Take 1 tablet (300 mg total) by mouth 2 (two) times daily.   ATAZANAVIR (REYATAZ) 300 MG CAPSULE    Take 1 capsule (300 mg total) by mouth daily.   RITONAVIR (NORVIR) 100 MG TABS    Take 1 tablet (100 mg total) by mouth daily.   TENOFOVIR (VIREAD) 300 MG TABLET    Take 1 tablet (300 mg total) by mouth daily.  Modified Medications   No medications on file  Discontinued Medications   CLINDAMYCIN (CLEOCIN) 150 MG CAPSULE    Take 2 capsules (300 mg total) by mouth every 6 (six) hours.   HYDROCODONE-ACETAMINOPHEN (NORCO/VICODIN) 5-325 MG PER TABLET    Take 1-2 tablets every 6 hours as needed for severe pain    Subjective: [name] [name] is in for her routine visit. Last week she began to develop a painful swelling in her right axilla and she went to the nursing department 2 days ago. She has solitary right axillary abscess that  was incised and drained. It does not appear that cultures were obtained. She was given a prescription for clindamycin but could not get it filled because she could not afford the prescription. She is feeling better and has minimal pain which he manages with Tylenol.  She recalls missing only one dose of her HIV medications last week when she was in severe pain and overslept.  She is trying to lose weight.  Review of Systems: Pertinent items are noted in HPI.  Past Medical History  Diagnosis Date  . HIV (human immunodeficiency virus infection) 2007  . Hypertension   . GERD (gastroesophageal reflux disease)     History  Substance Use Topics  . Smoking status: Never Smoker   . Smokeless tobacco: Never Used  . Alcohol Use: No    Family History  Problem Relation Age of Onset  . Coronary artery disease Mother   . Hypertension Mother   . Anxiety disorder Mother   . Arthritis Mother     rheumatoid  . Cancer Father     possible prostate cancer  . Diabetes Sister     Allergies  Allergen Reactions  . Sulfa Antibiotics Other (See Comments)    Blood pressure drops.  . Penicillins Rash    Objective: Temp: 98.2 F (36.8 C) (06/30 1424) Temp src: Oral (06/30 1424) BP: 164/91 mmHg (06/30 1424) Pulse Rate: 74 (06/30 1424)  General: She is in good spiritis. Oral: new full dentures Skin: right axillary abscess well drained. No active drainage.Nontender  Lab Results HIV 1 RNA Quant (copies/mL)  Date Value  08/24/2012 <20   02/13/2012 <20   12/05/2011 23*     HIV-1 RNA Viral Load (no units)  Date Value  10/22/2011 <40   04/15/2011 <40   11/23/2010 <40      CD4 T Cell Abs (cmm)  Date Value  08/24/2012 260*  02/13/2012 330*  12/05/2011 250*     Assessment: Her HIV is well controlled. I will continue her current antiretroviral regimen.  Her abscess is improving after incision and drainage. She does not need antibiotics.  I encouraged lifestyle modification and to meet her  goals getting down to 140-150 pounds. I will also make a referral for nutritional counseling.  Plan: 1. Continue current antiretroviral regimen 2. Nutrition referral 3. Followup after lab work in 6 months   Cliffton Asters, MD [facility] [phone] pager   [phone] cell

## 2012-09-08 NOTE — Addendum Note (Signed)
Addended by: Jennet Maduro D on: 09/08/2012 08:30 AM   Modules accepted: Orders

## 2012-09-10 ENCOUNTER — Other Ambulatory Visit: Payer: Self-pay | Admitting: Internal Medicine

## 2012-09-14 ENCOUNTER — Ambulatory Visit: Payer: Self-pay

## 2012-09-25 ENCOUNTER — Encounter: Payer: Self-pay | Admitting: *Deleted

## 2012-10-07 ENCOUNTER — Telehealth: Payer: Self-pay | Admitting: *Deleted

## 2012-10-07 DIAGNOSIS — B20 Human immunodeficiency virus [HIV] disease: Secondary | ICD-10-CM

## 2012-10-07 MED ORDER — ABACAVIR SULFATE 300 MG PO TABS: ORAL_TABLET | ORAL | Status: DC

## 2012-10-07 NOTE — Telephone Encounter (Signed)
Refilled abacavir for pt.

## 2012-10-26 ENCOUNTER — Ambulatory Visit: Payer: Self-pay

## 2012-10-26 ENCOUNTER — Ambulatory Visit: Payer: Self-pay | Admitting: *Deleted

## 2012-10-26 ENCOUNTER — Ambulatory Visit (INDEPENDENT_AMBULATORY_CARE_PROVIDER_SITE_OTHER): Payer: Medicare Other | Admitting: *Deleted

## 2012-10-26 VITALS — BP 147/87 | HR 87 | Temp 98.1°F | Resp 16 | Ht 60.5 in | Wt 182.2 lb

## 2012-10-26 DIAGNOSIS — B20 Human immunodeficiency virus [HIV] disease: Secondary | ICD-10-CM

## 2012-10-26 DIAGNOSIS — Z23 Encounter for immunization: Secondary | ICD-10-CM

## 2012-10-26 NOTE — Progress Notes (Signed)
Patient here for week 24 study visit. She has complaints of her rt knee being tender and weak. She has fallen due to instability in the knee. Also, continues to have pain and numbness under her left foot toe area. She also says she has an outbreak of herpes in her vaginal area. I instructed her to start taking the zovirax she has when it feels like it is getting ready to break out. She said she had said some left over from a prior outbreak. I gave her hepatitis B vaccine injection #1, since her last hep b surface antibody was negative and scheduled her for the followup injections. She will return for study in February.

## 2012-11-26 ENCOUNTER — Ambulatory Visit (INDEPENDENT_AMBULATORY_CARE_PROVIDER_SITE_OTHER): Payer: Medicare Other | Admitting: *Deleted

## 2012-11-26 DIAGNOSIS — A6000 Herpesviral infection of urogenital system, unspecified: Secondary | ICD-10-CM

## 2012-11-26 DIAGNOSIS — A6009 Herpesviral infection of other urogenital tract: Secondary | ICD-10-CM

## 2012-11-26 DIAGNOSIS — Z23 Encounter for immunization: Secondary | ICD-10-CM

## 2012-11-26 DIAGNOSIS — B20 Human immunodeficiency virus [HIV] disease: Secondary | ICD-10-CM

## 2012-11-26 MED ORDER — ACYCLOVIR 5 % EX OINT: TOPICAL_OINTMENT | CUTANEOUS | Status: DC

## 2012-11-26 MED ORDER — VALACYCLOVIR HCL 500 MG PO TABS: 500.0000 mg | ORAL_TABLET | Freq: Three times a day (TID) | ORAL | Status: DC

## 2012-12-07 ENCOUNTER — Other Ambulatory Visit: Payer: Self-pay | Admitting: *Deleted

## 2012-12-07 ENCOUNTER — Telehealth: Payer: Self-pay | Admitting: *Deleted

## 2012-12-07 DIAGNOSIS — I10 Essential (primary) hypertension: Secondary | ICD-10-CM

## 2012-12-07 DIAGNOSIS — B20 Human immunodeficiency virus [HIV] disease: Secondary | ICD-10-CM

## 2012-12-07 MED ORDER — HYDROCHLOROTHIAZIDE 25 MG PO TABS: 25.0000 mg | ORAL_TABLET | Freq: Every day | ORAL | Status: DC

## 2012-12-07 NOTE — Telephone Encounter (Signed)
Pt called requesting a refill of her HCTZ.  This is not on her active med list (d/c 09/05/12 due to noncompliance). Ok to refill? Andree Coss, RN

## 2012-12-07 NOTE — Telephone Encounter (Signed)
Please refill HCTZ.

## 2012-12-10 ENCOUNTER — Telehealth: Payer: Self-pay | Admitting: *Deleted

## 2012-12-10 NOTE — Telephone Encounter (Signed)
RN spoke with pt about medication.  Pt stated that the Valtrex was "hard" on her stomach.  RN advised pt to take the medication just prior to beginning a meal.  Pt was taking the medication after the meal which upset her stomach.  Pt verbalized that she would try this change.  [facility] delivered the Acyclovir ointment yesterday and the Valtrex tablets will be delivered tomorrow.  Pt verbalized understanding.

## 2013-01-07 ENCOUNTER — Other Ambulatory Visit: Payer: Self-pay | Admitting: Internal Medicine

## 2013-01-26 ENCOUNTER — Ambulatory Visit (INDEPENDENT_AMBULATORY_CARE_PROVIDER_SITE_OTHER): Payer: Self-pay | Admitting: *Deleted

## 2013-01-26 VITALS — BP 147/91 | HR 76 | Temp 98.0°F | Resp 16 | Ht 60.5 in | Wt 181.5 lb

## 2013-01-26 DIAGNOSIS — Z21 Asymptomatic human immunodeficiency virus [HIV] infection status: Secondary | ICD-10-CM

## 2013-01-26 DIAGNOSIS — B20 Human immunodeficiency virus [HIV] disease: Secondary | ICD-10-CM

## 2013-01-26 LAB — COMPREHENSIVE METABOLIC PANEL
ALT: 13 U/L (ref 0–35)
AST: 15 U/L (ref 0–37)
Alkaline Phosphatase: 104 U/L (ref 39–117)
CO2: 23 mEq/L (ref 19–32)
Creat: 0.61 mg/dL (ref 0.50–1.10)
Sodium: 141 mEq/L (ref 135–145)
Total Bilirubin: 2.1 mg/dL — ABNORMAL HIGH (ref 0.3–1.2)
Total Protein: 8.2 g/dL (ref 6.0–8.3)

## 2013-01-26 LAB — LIPID PANEL
Cholesterol: 217 mg/dL — ABNORMAL HIGH (ref 0–200)
HDL: 66 mg/dL (ref >39)
LDL Cholesterol: 133 mg/dL — ABNORMAL HIGH (ref 0–99)
Total CHOL/HDL Ratio: 3.3 Ratio
Triglycerides: 92 mg/dL (ref <150)
VLDL: 18 mg/dL (ref 0–40)

## 2013-01-26 LAB — BILIRUBIN,DIRECT & INDIRECT (FRACTIONATED): Bilirubin, Direct: 0.3 mg/dL (ref 0.0–0.3)

## 2013-01-26 NOTE — Progress Notes (Signed)
  Subjective:    Patient ID: [name] [name], female    DOB: 10-11-1955, 57 y.o.   MRN: [id number]  HPI    Review of Systems  HENT: Positive for rhinorrhea.   Respiratory: Negative.   Cardiovascular: Negative.   Gastrointestinal: Negative.   Genitourinary: Positive for frequency.  Musculoskeletal: Positive for arthralgias.  Skin: Negative.   Neurological: Negative.   Psychiatric/Behavioral: Negative.        Objective:   Physical Exam  Constitutional: She is oriented to person, place, and time. She appears well-developed and well-nourished.  HENT:  Mouth/Throat: Oropharynx is clear and moist.  Cardiovascular: Normal rate, regular rhythm, normal heart sounds and intact distal pulses.   Pulmonary/Chest: Effort normal and breath sounds normal. No respiratory distress.  Abdominal: Soft. Bowel sounds are normal.  Musculoskeletal: Normal range of motion.  Lymphadenopathy:    She has no cervical adenopathy.  Neurological: She is alert and oriented to person, place, and time.  Skin: Skin is warm and dry.  Psychiatric: She has a normal mood and affect. Her behavior is normal.          Assessment & Plan:  Patient here today to screen for A5325, the Isotretinoin study. Informed consent was obtained after careful review of the consent and all questions were answered. We discussed the [facility] program and she was given the booklet explaining it. She agrees to abide by the program's requirements if randomized to the drug. She is postmenopausal and has symptoms of vaginal dryness, hotflashes and nightsweats. She also has numbness and pain in her lt foot under the toes. She has arthritis with pain and weakness in the rt knee.  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 If eligible she will return December 10th for the preentry visit.

## 2013-01-27 LAB — HEPATITIS C ANTIBODY: HCV Ab: NEGATIVE

## 2013-01-27 LAB — HEPATITIS B SURFACE ANTIGEN: Hepatitis B Surface Ag: NEGATIVE

## 2013-01-27 LAB — HIV-1 RNA QUANT-NO REFLEX-BLD
HIV 1 RNA Quant: <20 copies/mL (ref <20)
HIV-1 RNA Quant, Log: <1.3 {Log} (ref <1.30)

## 2013-01-27 LAB — HEPATITIS B SURFACE ANTIBODY,QUALITATIVE: Hep B S Ab: POSITIVE — AB

## 2013-01-27 LAB — HEPATITIS B CORE ANTIBODY, TOTAL: Hep B Core Total Ab: NONREACTIVE

## 2013-02-08 ENCOUNTER — Other Ambulatory Visit: Payer: Self-pay | Admitting: Internal Medicine

## 2013-02-08 ENCOUNTER — Telehealth: Payer: Self-pay | Admitting: *Deleted

## 2013-02-08 DIAGNOSIS — B20 Human immunodeficiency virus [HIV] disease: Secondary | ICD-10-CM

## 2013-02-08 NOTE — Telephone Encounter (Signed)
Please add her on to my schedule tomorrow at 8:45 am.

## 2013-02-08 NOTE — Telephone Encounter (Signed)
Patient called and said she has been having chills, productive cough(yellow), chest congestion and sinus congestion since last Tuesday. I recommended she try some OTC cold medicine, but I did tell her I would check with Dr. Orvan Falconer to see if he could call her in an antibiotic. There are no open slots for appointments this week.

## 2013-02-09 ENCOUNTER — Other Ambulatory Visit: Payer: Self-pay | Admitting: *Deleted

## 2013-02-09 ENCOUNTER — Telehealth: Payer: Self-pay | Admitting: Internal Medicine

## 2013-02-09 DIAGNOSIS — J209 Acute bronchitis, unspecified: Secondary | ICD-10-CM

## 2013-02-09 MED ORDER — AZITHROMYCIN 250 MG PO TABS: ORAL_TABLET | ORAL | Status: DC

## 2013-02-09 NOTE — Telephone Encounter (Signed)
Apparently, [name] [name] is unable to arrange transportation to come in for evaluation. She's had one week of symptoms compatible with acute bronchitis or we'll treat with a short course of empiric azithromycin.

## 2013-02-09 NOTE — Telephone Encounter (Signed)
Message left to pick up rx at [facility] today.

## 2013-02-17 ENCOUNTER — Ambulatory Visit (INDEPENDENT_AMBULATORY_CARE_PROVIDER_SITE_OTHER): Payer: Medicare Other | Admitting: Infectious Disease

## 2013-02-17 ENCOUNTER — Encounter: Payer: Self-pay | Admitting: Infectious Disease

## 2013-02-17 ENCOUNTER — Ambulatory Visit (INDEPENDENT_AMBULATORY_CARE_PROVIDER_SITE_OTHER): Payer: Medicare Other | Admitting: *Deleted

## 2013-02-17 VITALS — BP 146/84 | HR 77 | Temp 98.4°F | Wt 181.0 lb

## 2013-02-17 DIAGNOSIS — Z21 Asymptomatic human immunodeficiency virus [HIV] infection status: Secondary | ICD-10-CM

## 2013-02-17 DIAGNOSIS — B20 Human immunodeficiency virus [HIV] disease: Secondary | ICD-10-CM

## 2013-02-17 DIAGNOSIS — A6000 Herpesviral infection of urogenital system, unspecified: Secondary | ICD-10-CM

## 2013-02-17 DIAGNOSIS — I10 Essential (primary) hypertension: Secondary | ICD-10-CM

## 2013-02-17 DIAGNOSIS — Z0000 Encounter for general adult medical examination without abnormal findings: Secondary | ICD-10-CM

## 2013-02-17 DIAGNOSIS — A6009 Herpesviral infection of other urogenital tract: Secondary | ICD-10-CM

## 2013-02-17 LAB — COMPREHENSIVE METABOLIC PANEL
ALT: 10 U/L (ref 0–35)
BUN: 14 mg/dL (ref 6–23)
CO2: 28 mEq/L (ref 19–32)
Creat: 0.69 mg/dL (ref 0.50–1.10)
Glucose, Bld: 82 mg/dL (ref 70–99)
Sodium: 137 mEq/L (ref 135–145)
Total Bilirubin: 1.1 mg/dL (ref 0.3–1.2)
Total Protein: 7.5 g/dL (ref 6.0–8.3)

## 2013-02-17 LAB — LIPID PANEL
Cholesterol: 187 mg/dL (ref 0–200)
HDL: 53 mg/dL (ref >39)
Total CHOL/HDL Ratio: 3.5 Ratio
Triglycerides: 56 mg/dL (ref <150)
VLDL: 11 mg/dL (ref 0–40)

## 2013-02-17 LAB — BILIRUBIN,DIRECT & INDIRECT (FRACTIONATED): Bilirubin, Direct: 0.2 mg/dL (ref 0.0–0.3)

## 2013-02-17 MED ORDER — HYDROCHLOROTHIAZIDE 25 MG PO TABS
25.0000 mg | ORAL_TABLET | Freq: Every day | ORAL | Status: DC
Start: 2013-02-17 — End: 2013-07-01

## 2013-02-17 NOTE — Progress Notes (Signed)
Here for preentry visit for study a5325. Denies any current new problems. Had resp. Sx lately which have resolved. Since she was given zithromax for treatment will need to check with study team about eligiblity. If eligible will enroll next Tuesday.

## 2013-02-17 NOTE — Progress Notes (Signed)
Subjective:    Patient ID: [name] [name], female    DOB: [birth year], 57 y.o.   MRN: [id number]  HPI  [name] [name] is a 57 y.o. female with HIV infection who is doing superbly well on their  antiviral regimen, abacavir, viread, reyataz and norvir with undetectable viral load and health cd4 count.   She is here for physical exam prior to enrollment into ACTG 5325 study.   Her abscess in her axilla has resolved.She had one loose bm but otherwise no complaints.   I reviewed her genotype from 09/2008 after her visit and this is listed below    HIVdb: Genotypic Resistance Interpretation Algorithm Date: 17-Feb-2013 14:04:33 UTC   Drug Resistance Interpretation: PR PI Major Resistance Mutations: None PI Minor Resistance Mutations: None Other Mutations: K20R, M36I Protease Inhibitors atazanavir/r (ATV/r) Susceptible darunavir/r (DRV/r) Susceptible fosamprenavir/r (FPV/r) Susceptible indinavir/r (IDV/r) Susceptible lopinavir/r (LPV/r) Susceptible nelfinavir (NFV) Susceptible saquinavir/r (SQV/r) Susceptible tipranavir/r (TPV/r) Susceptible PR Comments Other K20R is a highly polymorphic, PI-selected accessory mutation that improves HIV-1 replication fitness in viruses with other PI-resistance mutations.  Drug Resistance Interpretation: RT NRTI Resistance Mutations: M184I NNRTI Resistance Mutations: K101P, K103N, P225H Other Mutations: None Nucleoside RTI lamivudine (3TC) High-level resistance abacavir (ABC) Low-level resistance zidovudine (AZT) Susceptible stavudine (D4T) Susceptible didanosine (DDI) Potential low-level resistance emtricitabine (FTC) High-level resistance tenofovir (TDF) Susceptible Non-Nucleoside RTI efavirenz (EFV) High-level resistance etravirine (ETR) Intermediate resistance nevirapine (NVP) High-level resistance rilpivirine (RPV) High-level resistance RT Comments NRTI M184V/I cause high-level resistance to 3TC and FTC and low-level resistance  to ddI and ABC. However, M184V/I are not contraindications to continued treatment with 3TC or FTC because they increase susceptibility to AZT, TDF and d4T and are associated with clinically significant reductions in HIV-1 replication. In combination with K101E or E138K, M184I synergistically reduces RPV susceptibility. NNRTI K101P is a nonpolymorphic mutation that causes high-level resistance (>50-fold reduced susceptibility) to NVP, EFV and RPV and intermediate resistance (~5-fold reduced susceptibility) to ETR. It has a weight of 2.5 in the [employer] ETR genotypic susceptibility score. K103N is a nonpolymorphic mutation that causes high-level resistance to NVP (~50-fold reduced susceptibility) and EFV (~20-fold reduced susceptibility). P225H is a nonpolymorphic EFV-selected mutation that usually occurs in combination with K103N. The combination of P225H and K103N causes a >50-fold reduction in EFV susceptibility.   Review of Systems  Constitutional: Negative for fever, chills, diaphoresis, activity change, appetite change, fatigue and unexpected weight change.  HENT: Negative for congestion, rhinorrhea, sinus pressure, sneezing, sore throat and trouble swallowing.   Eyes: Negative for photophobia and visual disturbance.  Respiratory: Negative for cough, chest tightness, shortness of breath, wheezing and stridor.   Cardiovascular: Negative for chest pain, palpitations and leg swelling.  Gastrointestinal: Negative for nausea, vomiting, abdominal pain, diarrhea, constipation, blood in stool, abdominal distention and anal bleeding.  Genitourinary: Negative for dysuria, hematuria, flank pain and difficulty urinating.  Musculoskeletal: Negative for arthralgias, back pain, gait problem, joint swelling and myalgias.  Skin: Negative for color change, pallor, rash and wound.  Neurological: Negative for dizziness, tremors, weakness and light-headedness.  Hematological: Negative for adenopathy. Does not  bruise/bleed easily.  Psychiatric/Behavioral: Negative for behavioral problems, confusion, sleep disturbance, dysphoric mood, decreased concentration and agitation.       Objective:   Physical Exam  Constitutional: She is oriented to person, place, and time. She appears well-developed and well-nourished. No distress.  HENT:  Head: Normocephalic and atraumatic.  Mouth/Throat: Oropharynx is clear and moist. No oropharyngeal exudate.  Eyes: Conjunctivae and EOM  are normal. Pupils are equal, round, and reactive to light. No scleral icterus.  Neck: Normal range of motion. Neck supple. No JVD present.  Cardiovascular: Normal rate, regular rhythm and normal heart sounds.  Exam reveals no gallop and no friction rub.   No murmur heard. Pulmonary/Chest: Effort normal and breath sounds normal. No respiratory distress. She has no wheezes. She has no rales. She exhibits no tenderness.  Abdominal: She exhibits no distension and no mass. There is no tenderness. There is no rebound and no guarding.  Musculoskeletal: She exhibits no edema and no tenderness.  Lymphadenopathy:    She has no cervical adenopathy.  Neurological: She is alert and oriented to person, place, and time. No cranial nerve deficit. She exhibits normal muscle tone. Coordination normal.  Skin: Skin is warm and dry. She is not diaphoretic. No erythema. No pallor.  Psychiatric: She has a normal mood and affect. Her behavior is normal. Judgment and thought content normal.          Assessment & Plan:   HIV: on abacavir two 300mg  pills, viread, reyataz and norvir. Given the fact that her only NRTI mutation is 184 mutation. I would feel confident having her on reyataz, norvir and truvada since the 184 V in presence of emtricitabine would sensitize virus to TNF and I and others such as my prior Hetty Ely have had great success treating such pts with 184 with Truvada and boosted PI. Alternatively if one wanted to keep abacavir which itself is  not completely active changing to epzicom with 3tc on board would reduce pill burden by one and sensitize virus to viread via 3tc and 184. While she is in study we are not permitted to change her ARV regimen and sucha  Simplification regimen is NOT urgent at all. 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

## 2013-02-18 ENCOUNTER — Encounter: Payer: Self-pay | Admitting: Internal Medicine

## 2013-02-18 LAB — CD4/CD8 (T-HELPER/T-SUPPRESSOR CELL)
CD4%: 22.9
CD4: 275

## 2013-02-23 ENCOUNTER — Ambulatory Visit (INDEPENDENT_AMBULATORY_CARE_PROVIDER_SITE_OTHER): Payer: Medicare Other | Admitting: *Deleted

## 2013-02-23 VITALS — BP 157/93 | HR 70 | Temp 97.8°F | Resp 16 | Wt 182.2 lb

## 2013-02-23 DIAGNOSIS — B20 Human immunodeficiency virus [HIV] disease: Secondary | ICD-10-CM

## 2013-02-23 DIAGNOSIS — Z21 Asymptomatic human immunodeficiency virus [HIV] infection status: Secondary | ICD-10-CM

## 2013-02-23 LAB — BILIRUBIN,DIRECT & INDIRECT (FRACTIONATED): Bilirubin, Direct: 0.3 mg/dL (ref 0.0–0.3)

## 2013-02-23 LAB — COMPREHENSIVE METABOLIC PANEL
ALT: 13 U/L (ref 0–35)
AST: 16 U/L (ref 0–37)
Albumin: 3.9 g/dL (ref 3.5–5.2)
BUN: 8 mg/dL (ref 6–23)
CO2: 29 mEq/L (ref 19–32)
Calcium: 8.9 mg/dL (ref 8.4–10.5)
Chloride: 104 mEq/L (ref 96–112)
Potassium: 3.6 mEq/L (ref 3.5–5.3)

## 2013-02-23 LAB — LIPID PANEL
Cholesterol: 163 mg/dL (ref 0–200)
HDL: 46 mg/dL (ref >39)
LDL Cholesterol: 90 mg/dL (ref 0–99)
Total CHOL/HDL Ratio: 3.5 Ratio
Triglycerides: 133 mg/dL (ref <150)
VLDL: 27 mg/dL (ref 0–40)

## 2013-02-23 LAB — HEMOGLOBIN A1C: Mean Plasma Glucose: 120 mg/dL — ABNORMAL HIGH (ref <117)

## 2013-02-23 MED ORDER — ISOTRETINOIN 40 MG PO CAPS: 40.0000 mg | ORAL_CAPSULE | Freq: Every day | ORAL | Status: DC

## 2013-02-23 NOTE — Progress Notes (Signed)
   Subjective:    Patient ID: [name] [name], female    DOB: [birth year], 57 y.o.   MRN: [id number]  HPI    Review of Systems  Constitutional: Negative.   HENT: Negative.   Eyes: Negative.   Respiratory: Negative.   Cardiovascular: Negative.   Gastrointestinal: Negative.   Genitourinary: Negative.   Musculoskeletal: Positive for arthralgias.  Skin: Negative.   Neurological: Negative.   Psychiatric/Behavioral: Negative.        Objective:   Physical Exam  Constitutional: She is oriented to person, place, and time. She appears well-developed.  HENT:  Mouth/Throat: No oropharyngeal exudate.  Eyes: No scleral icterus.  Cardiovascular: Normal rate, regular rhythm, normal heart sounds and intact distal pulses.   Pulmonary/Chest: Effort normal and breath sounds normal.  Abdominal: Soft. Bowel sounds are normal.  Musculoskeletal: Normal range of motion.  Lymphadenopathy:    She has no cervical adenopathy.  Neurological: She is alert and oriented to person, place, and time.  Skin: Skin is warm and dry.  Psychiatric: She has a normal mood and affect.          Assessment & Plan:  Patient here for entry into A5325, the isotretinoin study. Dennie Bible was randomized to isotretinoin (arm A) and will begin taking the drug today. We reviewed the side effects, dosing and what she needs to be aware of while taking the drug. I encouraged her to drink lots of water, stay out of sunlight and use lotions to help with skin dryness. She was entered in to the [place] program today so that we could dispense the drug. She was instructed to return all pills and boxes of the medication at the 4 week visit. She was also instructed to notify us if she started noticing any mood changes or depression and before she started any other medication, prescription or OTC. She denies any new problems currently and will return in 2 weeks for the next blood draw.

## 2013-02-23 NOTE — Addendum Note (Signed)
Addended by: Aris Lot C on: 02/23/2013 11:09 AM   Modules accepted: Orders

## 2013-03-08 ENCOUNTER — Other Ambulatory Visit: Payer: Self-pay

## 2013-03-09 ENCOUNTER — Ambulatory Visit (INDEPENDENT_AMBULATORY_CARE_PROVIDER_SITE_OTHER): Payer: Medicare Other | Admitting: *Deleted

## 2013-03-09 VITALS — Wt 182.2 lb

## 2013-03-09 DIAGNOSIS — Z21 Asymptomatic human immunodeficiency virus [HIV] infection status: Secondary | ICD-10-CM

## 2013-03-09 DIAGNOSIS — B20 Human immunodeficiency virus [HIV] disease: Secondary | ICD-10-CM

## 2013-03-09 NOTE — Progress Notes (Signed)
[name] [name] is here for A5325 study, week 2. Started study drug Isotretinoin 40mg  Daily on 02/23/2013 and has been adherent with regimen. She has started to experience dryness on and around her lips that began 1 week ago. She is using lip balm and lotion and this relieves her symptoms. She does c/o neck stiffness but has also been sleeping more due to the medication. PBMCs and plasma was drawn. She received $50 gift card for her visit. Next appt scheduled for Monday, March 23, 2013 @ 10am after she sees Dr. Orvan Falconer and Debbe Odea (financial counselor). Tacey Heap RN

## 2013-03-10 ENCOUNTER — Encounter: Payer: Self-pay | Admitting: Internal Medicine

## 2013-03-22 ENCOUNTER — Encounter: Payer: Self-pay | Admitting: Internal Medicine

## 2013-03-22 ENCOUNTER — Ambulatory Visit (INDEPENDENT_AMBULATORY_CARE_PROVIDER_SITE_OTHER): Payer: Medicare HMO | Admitting: *Deleted

## 2013-03-22 VITALS — BP 164/96 | HR 73 | Temp 97.7°F | Resp 16 | Wt 180.5 lb

## 2013-03-22 DIAGNOSIS — Z21 Asymptomatic human immunodeficiency virus [HIV] infection status: Secondary | ICD-10-CM

## 2013-03-22 DIAGNOSIS — B20 Human immunodeficiency virus [HIV] disease: Secondary | ICD-10-CM

## 2013-03-22 LAB — COMPREHENSIVE METABOLIC PANEL
ALT: 11 U/L (ref 0–35)
AST: 19 U/L (ref 0–37)
Albumin: 4.1 g/dL (ref 3.5–5.2)
Alkaline Phosphatase: 91 U/L (ref 39–117)
BILIRUBIN TOTAL: 2.1 mg/dL — AB (ref 0.3–1.2)
BUN: 13 mg/dL (ref 6–23)
CO2: 29 mEq/L (ref 19–32)
Calcium: 9.2 mg/dL (ref 8.4–10.5)
Chloride: 102 mEq/L (ref 96–112)
Creat: 0.68 mg/dL (ref 0.50–1.10)
Glucose, Bld: 89 mg/dL (ref 70–99)
Potassium: 4.1 mEq/L (ref 3.5–5.3)
Sodium: 141 mEq/L (ref 135–145)
TOTAL PROTEIN: 7.9 g/dL (ref 6.0–8.3)

## 2013-03-22 LAB — LIPID PANEL
CHOL/HDL RATIO: 4 ratio
Cholesterol: 233 mg/dL — ABNORMAL HIGH (ref 0–200)
HDL: 58 mg/dL (ref >39)
LDL CALC: 163 mg/dL — AB (ref 0–99)
TRIGLYCERIDES: 62 mg/dL (ref <150)
VLDL: 12 mg/dL (ref 0–40)

## 2013-03-22 LAB — CD4/CD8 (T-HELPER/T-SUPPRESSOR CELL)
CD4%: 22.6
CD4: 249
CD8 T CELL SUPPRESSOR: 39.1
CD8: 430

## 2013-03-22 LAB — CBC WITH DIFFERENTIAL/PLATELET
BASOS ABS: 0 10*3/uL (ref 0.0–0.1)
Basophils Relative: 0 % (ref 0–1)
EOS ABS: 0.1 10*3/uL (ref 0.0–0.7)
Eosinophils Relative: 2 % (ref 0–5)
HCT: 37.3 % (ref 36.0–46.0)
Hemoglobin: 12.4 g/dL (ref 12.0–15.0)
Lymphocytes Relative: 33 % (ref 12–46)
Lymphs Abs: 1.2 10*3/uL (ref 0.7–4.0)
MCH: 29.7 pg (ref 26.0–34.0)
MCHC: 33.2 g/dL (ref 30.0–36.0)
MCV: 89.4 fL (ref 78.0–100.0)
Monocytes Absolute: 0.3 10*3/uL (ref 0.1–1.0)
Monocytes Relative: 7 % (ref 3–12)
NEUTROS PCT: 58 % (ref 43–77)
Neutro Abs: 2.2 10*3/uL (ref 1.7–7.7)
PLATELETS: 237 10*3/uL (ref 150–400)
RBC: 4.17 MIL/uL (ref 3.87–5.11)
RDW: 13.4 % (ref 11.5–15.5)
WBC: 3.8 10*3/uL — AB (ref 4.0–10.5)

## 2013-03-22 LAB — BILIRUBIN,DIRECT & INDIRECT (FRACTIONATED)
BILIRUBIN DIRECT: 0.3 mg/dL (ref 0.0–0.3)
Indirect Bilirubin: 1.8 mg/dL — ABNORMAL HIGH (ref 0.0–0.9)

## 2013-03-22 LAB — LIPASE: LIPASE: 68 U/L (ref 0–75)

## 2013-03-22 LAB — HIV-1 RNA QUANT-NO REFLEX-BLD

## 2013-03-22 NOTE — Progress Notes (Signed)
Patient here for week 4 A5325 study visit. She c/o dry lips and says she feels like her skin on her face is paler, but smoother. Her throat is a little sore, but on exam is unremarkable. She says her neck has been achy and stiff since Christmas and hurts more to turn to the right. She has tried taking aleve for it a few times and says it hasn't helped. She will be seeing Dr. Megan Salon tomorrow. She forgot to bring all her meds back. I told her to not take anymore out of the remaining box and bring them back to [place]. She is to start taking 2 tablets a day now out of the ones dispensed today. She will return in 4 weeks for the next study appt.

## 2013-03-23 ENCOUNTER — Other Ambulatory Visit: Payer: Self-pay | Admitting: *Deleted

## 2013-03-23 ENCOUNTER — Ambulatory Visit: Payer: Self-pay | Admitting: Internal Medicine

## 2013-03-23 ENCOUNTER — Ambulatory Visit: Payer: Self-pay

## 2013-03-23 DIAGNOSIS — B20 Human immunodeficiency virus [HIV] disease: Secondary | ICD-10-CM

## 2013-03-23 MED ORDER — ATAZANAVIR SULFATE 300 MG PO CAPS: 300.0000 mg | ORAL_CAPSULE | Freq: Every day | ORAL | Status: DC

## 2013-03-23 MED ORDER — RITONAVIR 100 MG PO TABS: 100.0000 mg | ORAL_TABLET | Freq: Every day | ORAL | Status: DC

## 2013-03-23 MED ORDER — TENOFOVIR DISOPROXIL FUMARATE 300 MG PO TABS: 300.0000 mg | ORAL_TABLET | Freq: Every day | ORAL | Status: DC

## 2013-03-30 ENCOUNTER — Encounter: Payer: Self-pay | Admitting: Internal Medicine

## 2013-03-30 ENCOUNTER — Ambulatory Visit (INDEPENDENT_AMBULATORY_CARE_PROVIDER_SITE_OTHER): Payer: Medicare Other | Admitting: Internal Medicine

## 2013-03-30 ENCOUNTER — Ambulatory Visit: Payer: Self-pay

## 2013-03-30 VITALS — BP 189/120 | HR 75 | Temp 97.6°F | Ht 61.0 in | Wt 181.5 lb

## 2013-03-30 DIAGNOSIS — E785 Hyperlipidemia, unspecified: Secondary | ICD-10-CM

## 2013-03-30 NOTE — Progress Notes (Signed)
Patient ID: [name] [name], female   DOB: 02/13/1956, 58 y.o.   MRN: [id number]          [facility]  Patient Active Problem List   Diagnosis Date Noted  . HIV INFECTION 08/12/2006    Priority: High  . Dyslipidemia 03/30/2013    Priority: Medium  . HYPERTENSION 03/29/2009    Priority: Medium  . Rectal bleeding 06/26/2010  . LOW BACK PAIN, ACUTE 01/16/2010  . LESION, VAGINA 10/05/2009  . PAPANICOLAOU SMEAR OF VAGINA WITH LGSIL 01/25/2009  . VIRAL MENINGITIS 09/14/2008  . DEPRESSION 03/22/2008  . DENTAL CARIES 09/21/2007  . GROSS HEMATURIA 08/26/2007  . DEGENERATIVE JOINT DISEASE 08/12/2006  . GENITAL HERPES 01/13/2006  . ANEMIA 01/13/2006  . DEPENDENCE, COCAINE, UNSPECIFIED 01/13/2006  . ALLERGIC RHINITIS 01/13/2006  . GERD 01/13/2006  . PREGNANCY, ECTOPIC NOS W/O INTRAUTERINE PRG 01/13/2006  . ABORTION NOS W/PELVIC DAMAGE, UNSPECIFIED 01/13/2006  . ANGIOEDEMA 01/13/2006  . BUNIONECTOMY, HX OF 01/13/2006    Patient's Medications  New Prescriptions   No medications on file  Previous Medications   ABACAVIR (ZIAGEN) 300 MG TABLET    TAKE 1 TABLET BY MOUTH TWICE DAILY   ACYCLOVIR OINTMENT (ZOVIRAX) 5 %    Apply topically every 3 (three) hours.   ATAZANAVIR (REYATAZ) 300 MG CAPSULE    Take 1 capsule (300 mg total) by mouth daily.   HYDROCHLOROTHIAZIDE (HYDRODIURIL) 25 MG TABLET    Take 1 tablet (25 mg total) by mouth daily.   ISOTRETINOIN (ACCUTANE) 40 MG CAPSULE    Take 1 capsule (40 mg total) by mouth daily.   NAPROXEN SODIUM (ANAPROX) 220 MG TABLET    Take 220 mg by mouth 2 (two) times daily with a meal.   RITONAVIR (NORVIR) 100 MG TABS TABLET    Take 1 tablet (100 mg total) by mouth daily.   TENOFOVIR (VIREAD) 300 MG TABLET    Take 1 tablet (300 mg total) by mouth daily.   VALACYCLOVIR (VALTREX) 500 MG TABLET    Take 1 tablet (500 mg total) by mouth 3 (three) times daily.  Modified Medications   No medications on file  Discontinued  Medications   No medications on file    Subjective: [name] [name] is in for her routine visit today. She recalls missing one dose of her HIV medicines. It occurred last week when she was away from home unexpectedly and forgot to take it. She does use a pill box. She was working harder on diet and exercise last month but has slacked off over the holidays. She does not like taking too many pills so she stopped taking her blood pressure medication about 6 weeks ago. She denies having any problem tolerating it.  About 2 weeks ago she began to have gradual onset of left neck pain. She did not have any injury. The pain does not radiate anywhere else. She has been treating herself with Aleve and heating pad with partial improvement.  Review of Systems: Pertinent items are noted in HPI.  Past Medical History  Diagnosis Date  . HIV (human immunodeficiency virus infection) 2007  . Hypertension   . GERD (gastroesophageal reflux disease)     History  Substance Use Topics  . Smoking status: Never Smoker   . Smokeless tobacco: Never Used  . Alcohol Use: No    Family History  Problem Relation Age of Onset  . Coronary artery disease Mother   . Hypertension Mother   . Anxiety disorder Mother   .  Arthritis Mother     rheumatoid  . Cancer Father     possible prostate cancer  . Diabetes Sister     Allergies  Allergen Reactions  . Sulfa Antibiotics Other (See Comments)    Blood pressure drops.  . Penicillins Rash    Objective: Temp: 97.6 F (36.4 C) (01/20 1037) Temp src: Oral (01/20 1037) BP: 189/120 mmHg (01/20 1044) Pulse Rate: 75 (01/20 1037)  General: She is in no distress Oral: Dentures. No oropharyngeal lesions Skin: No rash Lungs: Clear Cor: Regular S1-S2 no murmurs Abdomen: Nontender Joints and extremities: Tenderness with palpation over the left side of the neck and upper back. Neuro: Alert with normal speech and conversation Mood and affect: Slightly flat  Lab  Results Lab Results  Component Value Date   WBC 3.8* 03/22/2013   HGB 12.4 03/22/2013   HCT 37.3 03/22/2013   MCV 89.4 03/22/2013   PLT 237 03/22/2013    Lab Results  Component Value Date   CREATININE 0.68 03/22/2013   BUN 13 03/22/2013   NA 141 03/22/2013   K 4.1 03/22/2013   CL 102 03/22/2013   CO2 29 03/22/2013    Lab Results  Component Value Date   ALT 11 03/22/2013   AST 19 03/22/2013   ALKPHOS 91 03/22/2013   BILITOT 2.1* 03/22/2013    Lab Results  Component Value Date   CHOL 233* 03/22/2013   HDL 58 03/22/2013   LDLCALC 163* 03/22/2013   TRIG 62 03/22/2013   CHOLHDL 4.0 03/22/2013    Lab Results HIV 1 RNA Quant (copies/mL)  Date Value  01/26/2013 <20   08/24/2012 <20   02/13/2012 <20      HIV-1 RNA Viral Load (no units)  Date Value  02/23/2013 <40   10/22/2011 <40   04/15/2011 <40      CD4 T Cell Abs (cmm)  Date Value  08/24/2012 260*  02/13/2012 330*  12/05/2011 250*     Assessment: Her HIV infection is under very good control. Once she is off her current research study in 6 months I will simplify and improve her regimen to once daily Truvada, Reyataz and Norvir.  Her blood pressure is quite high today. Talked to her about the risks of untreated hypertension. She is willing to go home and restart her diuretic. Her blood pressure was reasonably well controlled when she was taking it.  Her dyslipidemia it has gotten worse again she has slipped with her diet and exercise.  She has musculoskeletal neck pain.  Plan: 1. Continue current antiretroviral regimen for now. 2. Restart diuretics 3. Resume lifestyle modification efforts 4. Continue when necessary use of Aleve and heating pad 5. Followup in 4-6 weeks    Michel Bickers, MD [facility] [phone] pager   [phone] cell 03/30/2013, 11:08 AM

## 2013-04-20 ENCOUNTER — Ambulatory Visit (INDEPENDENT_AMBULATORY_CARE_PROVIDER_SITE_OTHER): Payer: Medicare HMO | Admitting: *Deleted

## 2013-04-20 ENCOUNTER — Ambulatory Visit: Payer: Medicare HMO

## 2013-04-20 VITALS — BP 135/91 | HR 85 | Temp 97.4°F | Resp 16 | Wt 181.2 lb

## 2013-04-20 DIAGNOSIS — B20 Human immunodeficiency virus [HIV] disease: Secondary | ICD-10-CM

## 2013-04-20 DIAGNOSIS — Z21 Asymptomatic human immunodeficiency virus [HIV] infection status: Secondary | ICD-10-CM

## 2013-04-20 LAB — LIPID PANEL
Cholesterol: 217 mg/dL — ABNORMAL HIGH (ref 0–200)
HDL: 53 mg/dL (ref >39)
LDL Cholesterol: 149 mg/dL — ABNORMAL HIGH (ref 0–99)
TRIGLYCERIDES: 73 mg/dL (ref <150)
Total CHOL/HDL Ratio: 4.1 Ratio
VLDL: 15 mg/dL (ref 0–40)

## 2013-04-20 LAB — COMPREHENSIVE METABOLIC PANEL
ALBUMIN: 3.9 g/dL (ref 3.5–5.2)
ALT: 14 U/L (ref 0–35)
AST: 18 U/L (ref 0–37)
Alkaline Phosphatase: 87 U/L (ref 39–117)
BILIRUBIN TOTAL: 0.9 mg/dL (ref 0.2–1.2)
BUN: 14 mg/dL (ref 6–23)
CO2: 28 meq/L (ref 19–32)
Calcium: 9.2 mg/dL (ref 8.4–10.5)
Chloride: 102 mEq/L (ref 96–112)
Creat: 0.71 mg/dL (ref 0.50–1.10)
GLUCOSE: 78 mg/dL (ref 70–99)
POTASSIUM: 4 meq/L (ref 3.5–5.3)
Sodium: 139 mEq/L (ref 135–145)
TOTAL PROTEIN: 8 g/dL (ref 6.0–8.3)

## 2013-04-20 LAB — HEPATIC FUNCTION PANEL
ALBUMIN: 4 g/dL (ref 3.5–5.2)
ALK PHOS: 88 U/L (ref 39–117)
ALT: 14 U/L (ref 0–35)
AST: 18 U/L (ref 0–37)
BILIRUBIN DIRECT: 0.1 mg/dL (ref 0.0–0.3)
BILIRUBIN TOTAL: 1 mg/dL (ref 0.2–1.2)
Indirect Bilirubin: 0.9 mg/dL (ref 0.2–1.2)
Total Protein: 8 g/dL (ref 6.0–8.3)

## 2013-04-20 LAB — HEMOGLOBIN A1C
Hgb A1c MFr Bld: 5.6 % (ref <5.7)
Mean Plasma Glucose: 114 mg/dL (ref <117)

## 2013-04-20 LAB — LIPASE: Lipase: 12 U/L (ref 0–75)

## 2013-04-20 NOTE — Progress Notes (Signed)
   Subjective:    Patient ID: [name] [name], female    DOB: [birth year], 58 y.o.   MRN: [id number]  HPI    Review of Systems  Constitutional: Negative.   HENT: Negative.   Eyes: Negative.   Respiratory: Negative.   Cardiovascular: Negative.   Gastrointestinal: Positive for blood in stool. Negative for anal bleeding.  Genitourinary: Negative.   Musculoskeletal: Positive for neck pain and neck stiffness.  Neurological: Negative.   Hematological: Negative.   Psychiatric/Behavioral: Negative.        Objective:   Physical Exam  HENT:  Mouth/Throat: Oropharynx is clear and moist.  Eyes: Left eye exhibits no discharge. No scleral icterus.  Cardiovascular: Normal rate, regular rhythm, normal heart sounds and intact distal pulses.   Pulmonary/Chest: Effort normal and breath sounds normal.  Abdominal: Soft. Bowel sounds are normal.  Musculoskeletal:       Left ankle: No tenderness.       Cervical back: She exhibits pain.       Left foot: She exhibits tenderness.  Lymphadenopathy:    She has no cervical adenopathy.  Skin: Skin is warm, dry and intact.  Psychiatric: She has a normal mood and affect. Her speech is normal and behavior is normal.   Patient reports seeing blood in her stool one time over the past month. She also c/o neck pain and stiffness, taking aleve for pain. She also has a tender spot on the top of her left foot which has started recently. She says the skin on her face has gotten very dry and her lips have been peeling. Using over the counter lotion which       Assessment & Plan:  She will return in 4 weeks for the next study appt. She returned 6 pills which means she probably missed her study treatment 2 days out of 29.

## 2013-04-20 NOTE — Addendum Note (Signed)
Addended by: Bobbie Stack on: 04/20/2013 01:35 PM   Modules accepted: Orders

## 2013-04-22 LAB — CD4/CD8 (T-HELPER/T-SUPPRESSOR CELL)
CD4%: 22.3
CD4: 312
CD8 T CELL SUPPRESSOR: 39.9
CD8: 559

## 2013-04-28 ENCOUNTER — Ambulatory Visit: Payer: Self-pay

## 2013-04-28 LAB — HIV-1 RNA QUANT-NO REFLEX-BLD: HIV-1 RNA Viral Load: <40

## 2013-05-07 ENCOUNTER — Other Ambulatory Visit: Payer: Self-pay | Admitting: *Deleted

## 2013-05-07 DIAGNOSIS — B20 Human immunodeficiency virus [HIV] disease: Secondary | ICD-10-CM

## 2013-05-07 MED ORDER — TENOFOVIR DISOPROXIL FUMARATE 300 MG PO TABS: 300.0000 mg | ORAL_TABLET | Freq: Every day | ORAL | Status: DC

## 2013-05-07 MED ORDER — RITONAVIR 100 MG PO TABS: 100.0000 mg | ORAL_TABLET | Freq: Every day | ORAL | Status: DC

## 2013-05-07 MED ORDER — ATAZANAVIR SULFATE 300 MG PO CAPS
300.0000 mg | ORAL_CAPSULE | Freq: Every day | ORAL | Status: DC
Start: 2013-05-07 — End: 2013-05-10

## 2013-05-10 ENCOUNTER — Other Ambulatory Visit: Payer: Self-pay | Admitting: *Deleted

## 2013-05-10 DIAGNOSIS — B20 Human immunodeficiency virus [HIV] disease: Secondary | ICD-10-CM

## 2013-05-10 MED ORDER — ATAZANAVIR SULFATE 300 MG PO CAPS: 300.0000 mg | ORAL_CAPSULE | Freq: Every day | ORAL | Status: DC

## 2013-05-10 MED ORDER — TENOFOVIR DISOPROXIL FUMARATE 300 MG PO TABS: 300.0000 mg | ORAL_TABLET | Freq: Every day | ORAL | Status: DC

## 2013-05-14 ENCOUNTER — Ambulatory Visit (INDEPENDENT_AMBULATORY_CARE_PROVIDER_SITE_OTHER): Payer: Medicare Other | Admitting: *Deleted

## 2013-05-14 ENCOUNTER — Ambulatory Visit (INDEPENDENT_AMBULATORY_CARE_PROVIDER_SITE_OTHER): Payer: Medicare HMO | Admitting: *Deleted

## 2013-05-14 VITALS — BP 128/82 | HR 74 | Temp 97.8°F | Resp 16 | Wt 180.2 lb

## 2013-05-14 DIAGNOSIS — Z21 Asymptomatic human immunodeficiency virus [HIV] infection status: Secondary | ICD-10-CM

## 2013-05-14 DIAGNOSIS — Z124 Encounter for screening for malignant neoplasm of cervix: Secondary | ICD-10-CM

## 2013-05-14 DIAGNOSIS — B20 Human immunodeficiency virus [HIV] disease: Secondary | ICD-10-CM

## 2013-05-14 DIAGNOSIS — Z1231 Encounter for screening mammogram for malignant neoplasm of breast: Secondary | ICD-10-CM

## 2013-05-14 LAB — CBC WITH DIFFERENTIAL/PLATELET
BASOS ABS: 0 10*3/uL (ref 0.0–0.1)
Basophils Relative: 1 % (ref 0–1)
EOS PCT: 3 % (ref 0–5)
Eosinophils Absolute: 0.1 10*3/uL (ref 0.0–0.7)
HCT: 38.2 % (ref 36.0–46.0)
Hemoglobin: 13.1 g/dL (ref 12.0–15.0)
LYMPHS PCT: 33 % (ref 12–46)
Lymphs Abs: 1.4 10*3/uL (ref 0.7–4.0)
MCH: 30.2 pg (ref 26.0–34.0)
MCHC: 34.3 g/dL (ref 30.0–36.0)
MCV: 88 fL (ref 78.0–100.0)
Monocytes Absolute: 0.3 10*3/uL (ref 0.1–1.0)
Monocytes Relative: 7 % (ref 3–12)
NEUTROS ABS: 2.3 10*3/uL (ref 1.7–7.7)
NEUTROS PCT: 56 % (ref 43–77)
PLATELETS: 294 10*3/uL (ref 150–400)
RBC: 4.34 MIL/uL (ref 3.87–5.11)
RDW: 13.9 % (ref 11.5–15.5)
WBC: 4.1 10*3/uL (ref 4.0–10.5)

## 2013-05-14 LAB — LIPID PANEL
CHOLESTEROL: 246 mg/dL — AB (ref 0–200)
HDL: 67 mg/dL (ref >39)
LDL Cholesterol: 159 mg/dL — ABNORMAL HIGH (ref 0–99)
TRIGLYCERIDES: 100 mg/dL (ref <150)
Total CHOL/HDL Ratio: 3.7 Ratio
VLDL: 20 mg/dL (ref 0–40)

## 2013-05-14 LAB — COMPREHENSIVE METABOLIC PANEL
ALBUMIN: 4.2 g/dL (ref 3.5–5.2)
ALT: 15 U/L (ref 0–35)
AST: 21 U/L (ref 0–37)
Alkaline Phosphatase: 98 U/L (ref 39–117)
BUN: 16 mg/dL (ref 6–23)
CALCIUM: 9.5 mg/dL (ref 8.4–10.5)
CHLORIDE: 97 meq/L (ref 96–112)
CO2: 33 meq/L — AB (ref 19–32)
Creat: 0.64 mg/dL (ref 0.50–1.10)
Glucose, Bld: 94 mg/dL (ref 70–99)
Potassium: 3.5 mEq/L (ref 3.5–5.3)
SODIUM: 139 meq/L (ref 135–145)
TOTAL PROTEIN: 8.4 g/dL — AB (ref 6.0–8.3)
Total Bilirubin: 1.3 mg/dL — ABNORMAL HIGH (ref 0.2–1.2)

## 2013-05-14 LAB — HEMOGLOBIN A1C
Hgb A1c MFr Bld: 5.7 % — ABNORMAL HIGH (ref <5.7)
Mean Plasma Glucose: 117 mg/dL — ABNORMAL HIGH (ref <117)

## 2013-05-14 NOTE — Progress Notes (Signed)
Patient here for week A5322 study visit. She started on isotretinoin for the A5325 study in December and has noticed a lot of dryness on her face and lips. She denies any other new problems. She is due back next week for an A5325 visit.

## 2013-05-14 NOTE — Progress Notes (Signed)
  Subjective:     [name] [name] is a 58 y.o. woman who comes in today for a  pap smear only. Previous abnormal Pap smears: no. Contraception: menopausal, condoms.  Complainted of pain when placing speculum today.  Vaginal wall irritated and bled slightly when sample obtained.  Objective:    There were no vitals taken for this visit. Pelvic Exam:  Pap smear obtained.   Assessment:    Screening pap smear.   Plan:    Follow up in one year, or as indicated by Pap results.  Pt given educational materials re: HIV and women, self-esteem, BSE, nutrition and diet management, PAP smears and partner safety. Pt given condoms.

## 2013-05-14 NOTE — Patient Instructions (Signed)
Your results will be ready in about a week.  I will mail them to you.  Thank you for coming to the Center for your care.  Denise,  RN 

## 2013-05-15 LAB — PROTEIN, URINE, RANDOM: Total Protein, Urine: 7 mg/dL

## 2013-05-15 LAB — CREATININE, URINE, RANDOM: Creatinine, Urine: 195.8 mg/dL

## 2013-05-17 ENCOUNTER — Encounter: Payer: Self-pay | Admitting: Internal Medicine

## 2013-05-18 ENCOUNTER — Encounter: Payer: Self-pay | Admitting: Internal Medicine

## 2013-05-18 ENCOUNTER — Ambulatory Visit (INDEPENDENT_AMBULATORY_CARE_PROVIDER_SITE_OTHER): Payer: Medicare Other | Admitting: Internal Medicine

## 2013-05-18 ENCOUNTER — Ambulatory Visit (INDEPENDENT_AMBULATORY_CARE_PROVIDER_SITE_OTHER): Payer: Medicare HMO | Admitting: *Deleted

## 2013-05-18 VITALS — BP 127/83 | HR 94 | Temp 98.2°F | Ht 61.0 in | Wt 182.5 lb

## 2013-05-18 DIAGNOSIS — B20 Human immunodeficiency virus [HIV] disease: Secondary | ICD-10-CM

## 2013-05-18 DIAGNOSIS — K219 Gastro-esophageal reflux disease without esophagitis: Secondary | ICD-10-CM | POA: Insufficient documentation

## 2013-05-18 DIAGNOSIS — Z21 Asymptomatic human immunodeficiency virus [HIV] infection status: Secondary | ICD-10-CM

## 2013-05-18 MED ORDER — FAMOTIDINE 40 MG PO TABS: 40.0000 mg | ORAL_TABLET | Freq: Every day | ORAL | Status: DC

## 2013-05-18 NOTE — Progress Notes (Signed)
Patient ID: [name] [name], female   DOB: [birth year], 58 y.o.   MRN: [id number] HPI: [name] [name] is a 58 y.o. female who is here for her routine visit.  Allergies: Allergies  Allergen Reactions  . Sulfa Antibiotics Other (See Comments)    Blood pressure drops.  . Penicillins Rash    Vitals: Temp: 98.2 F (36.8 C) (03/10 1049) Temp src: Oral (03/10 1049) BP: 127/83 mmHg (03/10 1049) Pulse Rate: 94 (03/10 1049)  Past Medical History: Past Medical History  Diagnosis Date  . HIV (human immunodeficiency virus infection) 2007  . Hypertension   . GERD (gastroesophageal reflux disease)     Social History: History   Social History  . Marital Status: Widowed    Spouse Name: N/A    Number of Children: 0  . Years of Education: 92 th grad   Occupational History  . distability     since 2010 for 042 status   Social History Main Topics  . Smoking status: Never Smoker   . Smokeless tobacco: Never Used  . Alcohol Use: No  . Drug Use: No     Comment: Former cocaine abuse, quit in 2000  . Sexual Activity: Not Currently     Comment: pt. declined condoms   Other Topics Concern  . None   Social History Narrative  . None    Previous Regimen:   Current Regimen: ATV/r  + ABC +  TDF  Labs: HIV 1 RNA Quant (copies/mL)  Date Value  01/26/2013 <20   08/24/2012 <20   02/13/2012 <20      HIV-1 RNA Viral Load (no units)  Date Value  04/20/2013 <40   02/23/2013 <40   10/22/2011 <40      CD4 T Cell Abs (cmm)  Date Value  08/24/2012 260*  02/13/2012 330*  12/05/2011 250*     Hep B S Ab (no units)  Date Value  01/26/2013 POS*     Hepatitis B Surface Ag (no units)  Date Value  01/26/2013 NEGATIVE      HCV Ab (no units)  Date Value  01/26/2013 NEGATIVE     CrCl: Estimated Creatinine Clearance: 73.9 ml/min (by C-G formula based on Cr of 0.64).  Lipids:    Component Value Date/Time   CHOL 246* 05/14/2013 1019   TRIG 100 05/14/2013 1019   HDL 67 05/14/2013 1019    CHOLHDL 3.7 05/14/2013 1019   VLDL 20 05/14/2013 1019   LDLCALC 159* 05/14/2013 1019    Assessment: 58 yo who was seen today for her routine visit. She has been on the regimen above in a study. She did complain of GERD like symptoms. She has been on a PPI in the past as well as H2 blocker. Since she is on atazanavir, we'll have to avoid PPI. Will start her on Pepcid instead and explained to her that it needs to be taken 12hr apart from her atazanavir. She is fine with it.   Recommendations: Cont current ART Start Pepcid 40mg  PO qday 12hr apart from [name] [name], [place], PharmD Clinical Infectious [place] [facility] 05/18/2013, 1:18 PM

## 2013-05-18 NOTE — Progress Notes (Signed)
   Subjective:    Patient ID: [name] [name], female    DOB: [birth year], 58 y.o.   MRN: [id number]  HPI    Review of Systems  Constitutional: Negative.        Objective:   Physical Exam        Assessment & Plan:

## 2013-05-18 NOTE — Progress Notes (Signed)
Patient here for week 12 A5325 study visit. She saw Dr. Megan Salon this morning before the visit and is c/o heartburn. He instructed her to take pepcid or zantac and had the pharmacist talk to her about it. She was instructed to take it 12 hours from the time of her ARVs. She plans to take it in the mornings with her isotretinoin and take the ARVS at night. She continues to have some residual neck stiffness and pain, but does not take anything for it. She will return in 2 weeks for the next visit.

## 2013-05-18 NOTE — Progress Notes (Signed)
Patient ID: [name] [name], female   DOB: 07/05/1955, 58 y.o.   MRN: [id number] @[facility]GO[facility]PT         Patient Active Problem List   Diagnosis Date Noted  . HIV INFECTION 08/12/2006    Priority: High  . Dyslipidemia 03/30/2013    Priority: Medium  . HYPERTENSION 03/29/2009    Priority: Medium  . Acid reflux 05/18/2013  . Rectal bleeding 06/26/2010  . LOW BACK PAIN, ACUTE 01/16/2010  . LESION, VAGINA 10/05/2009  . PAPANICOLAOU SMEAR OF VAGINA WITH LGSIL 01/25/2009  . VIRAL MENINGITIS 09/14/2008  . DEPRESSION 03/22/2008  . DENTAL CARIES 09/21/2007  . GROSS HEMATURIA 08/26/2007  . DEGENERATIVE JOINT DISEASE 08/12/2006  . GENITAL HERPES 01/13/2006  . ANEMIA 01/13/2006  . DEPENDENCE, COCAINE, UNSPECIFIED 01/13/2006  . ALLERGIC RHINITIS 01/13/2006  . GERD 01/13/2006  . PREGNANCY, ECTOPIC NOS W/O INTRAUTERINE PRG 01/13/2006  . ABORTION NOS W/PELVIC DAMAGE, UNSPECIFIED 01/13/2006  . ANGIOEDEMA 01/13/2006  . BUNIONECTOMY, HX OF 01/13/2006    Patient's Medications  New Prescriptions   FAMOTIDINE (PEPCID) 40 MG TABLET    Take 1 tablet (40 mg total) by mouth daily.  Previous Medications   ABACAVIR (ZIAGEN) 300 MG TABLET    TAKE 1 TABLET BY MOUTH TWICE DAILY   ACYCLOVIR OINTMENT (ZOVIRAX) 5 %    Apply topically every 3 (three) hours.   ATAZANAVIR (REYATAZ) 300 MG CAPSULE    Take 1 capsule (300 mg total) by mouth daily.   HYDROCHLOROTHIAZIDE (HYDRODIURIL) 25 MG TABLET    Take 1 tablet (25 mg total) by mouth daily.   ISOTRETINOIN (ACCUTANE) 40 MG CAPSULE    Take 1 capsule (40 mg total) by mouth daily.   NAPROXEN SODIUM (ANAPROX) 220 MG TABLET    Take 220 mg by mouth 2 (two) times daily with a meal.   RITONAVIR (NORVIR) 100 MG TABS TABLET    Take 1 tablet (100 mg total) by mouth daily.   TENOFOVIR (VIREAD) 300 MG TABLET    Take 1 tablet (300 mg total) by mouth daily.   VALACYCLOVIR (VALTREX) 500 MG TABLET    Take 1 tablet (500 mg total) by mouth 3 (three) times daily.  Modified Medications    No medications on file  Discontinued Medications   No medications on file    Subjective: [name] [name] is in for her routine visit. She is still having some neck pain but it has improved. It is not bothersome enough for her to take anything for it. It is most noticeable when she is laying in bed at night. She has no numbness tingling or weakness in her upper extremities. She has not missed any doses of her HIV medication. She did restart her hydrochlorothiazide and has had no problems tolerating it. She has started having increasing problems with acid reflux. She used to take Zantac and also took Prilosec years ago. She has a history of esophageal dilatation.  Review of Systems: Pertinent items are noted in HPI.  Past Medical History  Diagnosis Date  . HIV (human immunodeficiency virus infection) 2007  . Hypertension   . GERD (gastroesophageal reflux disease)     History  Substance Use Topics  . Smoking status: Never Smoker   . Smokeless tobacco: Never Used  . Alcohol Use: No    Family History  Problem Relation Age of Onset  . Coronary artery disease Mother   . Hypertension Mother   . Anxiety disorder Mother   . Arthritis Mother     rheumatoid  . Cancer  Father     possible prostate cancer  . Diabetes Sister     Allergies  Allergen Reactions  . Sulfa Antibiotics Other (See Comments)    Blood pressure drops.  . Penicillins Rash    Objective: Temp: 98.2 F (36.8 C) (03/10 1049) Temp src: Oral (03/10 1049) BP: 127/83 mmHg (03/10 1049) Pulse Rate: 94 (03/10 1049) Body mass index is 34.5 kg/(m^2).  General: She is in good spirits Oral: No oropharyngeal lesions Skin: No rash Lungs: Clear Cor: Regular S1 and S2 with no murmurs Abdomen: Soft and nontender  Lab Results Lab Results  Component Value Date   WBC 4.1 05/14/2013   HGB 13.1 05/14/2013   HCT 38.2 05/14/2013   MCV 88.0 05/14/2013   PLT 294 05/14/2013    Lab Results  Component Value Date   CREATININE 0.64  05/14/2013   BUN 16 05/14/2013   NA 139 05/14/2013   K 3.5 05/14/2013   CL 97 05/14/2013   CO2 33* 05/14/2013    Lab Results  Component Value Date   ALT 15 05/14/2013   AST 21 05/14/2013   ALKPHOS 98 05/14/2013   BILITOT 1.3* 05/14/2013    Lab Results  Component Value Date   CHOL 246* 05/14/2013   HDL 67 05/14/2013   LDLCALC 159* 05/14/2013   TRIG 100 05/14/2013   CHOLHDL 3.7 05/14/2013    Lab Results HIV 1 RNA Quant (copies/mL)  Date Value  01/26/2013 <20   08/24/2012 <20   02/13/2012 <20      HIV-1 RNA Viral Load (no units)  Date Value  04/20/2013 <40   02/23/2013 <40   10/22/2011 <40      CD4 T Cell Abs (cmm)  Date Value  08/24/2012 260*  02/13/2012 330*  12/05/2011 250*     Assessment: Her HIV infection is under very good control. Her viral load is undetectable and her CD4 count is up to 312. She is currently on a research study receiving isotretinoin and cannot change antiretroviral therapy until she comes off study in one month. At that time will consider changing her to darunivir-cobicistat (and stopping Reyataz and Norvir) so she can take a proton pump inhibitor. I will have her start once daily Pepcid now.   Her blood pressure is doing much better back on hydrochlorothiazide. She probably has some degenerative spine disease causing her mild pain.    Plan: 1. Continue current antiretrovirals regimen 2. Continue hydrochlorothiazide 3. Start Pepcid 40 mg daily 12 hours after taking her antiretrovirals medications 4. Followup in 6 weeks   Michel Bickers, MD [facility] for [place] [phone] pager   [phone] cell 05/18/2013, 11:18 AM

## 2013-05-21 ENCOUNTER — Telehealth: Payer: Self-pay | Admitting: *Deleted

## 2013-05-21 ENCOUNTER — Encounter: Payer: Self-pay | Admitting: *Deleted

## 2013-05-21 NOTE — Telephone Encounter (Signed)
Unable to speak to pt directly.  Will send a letter.

## 2013-06-02 ENCOUNTER — Ambulatory Visit (INDEPENDENT_AMBULATORY_CARE_PROVIDER_SITE_OTHER): Payer: Medicare HMO | Admitting: *Deleted

## 2013-06-02 VITALS — BP 123/83 | HR 80 | Temp 97.8°F | Resp 16 | Wt 182.2 lb

## 2013-06-02 DIAGNOSIS — B20 Human immunodeficiency virus [HIV] disease: Secondary | ICD-10-CM

## 2013-06-02 DIAGNOSIS — Z21 Asymptomatic human immunodeficiency virus [HIV] infection status: Secondary | ICD-10-CM

## 2013-06-02 LAB — COMPREHENSIVE METABOLIC PANEL
ALBUMIN: 4.2 g/dL (ref 3.5–5.2)
ALT: 12 U/L (ref 0–35)
AST: 15 U/L (ref 0–37)
Alkaline Phosphatase: 93 U/L (ref 39–117)
BUN: 10 mg/dL (ref 6–23)
CO2: 30 mEq/L (ref 19–32)
Calcium: 9.5 mg/dL (ref 8.4–10.5)
Chloride: 98 mEq/L (ref 96–112)
Creat: 0.68 mg/dL (ref 0.50–1.10)
GLUCOSE: 58 mg/dL — AB (ref 70–99)
POTASSIUM: 3.1 meq/L — AB (ref 3.5–5.3)
Sodium: 139 mEq/L (ref 135–145)
Total Bilirubin: 1.4 mg/dL — ABNORMAL HIGH (ref 0.2–1.2)
Total Protein: 8.3 g/dL (ref 6.0–8.3)

## 2013-06-02 LAB — HEPATIC FUNCTION PANEL
ALT: 12 U/L (ref 0–35)
AST: 15 U/L (ref 0–37)
Albumin: 4.2 g/dL (ref 3.5–5.2)
Alkaline Phosphatase: 93 U/L (ref 39–117)
BILIRUBIN DIRECT: 0.2 mg/dL (ref 0.0–0.3)
BILIRUBIN INDIRECT: 1.2 mg/dL (ref 0.2–1.2)
BILIRUBIN TOTAL: 1.4 mg/dL — AB (ref 0.2–1.2)
Total Protein: 8.3 g/dL (ref 6.0–8.3)

## 2013-06-02 LAB — CBC WITH DIFFERENTIAL/PLATELET
BASOS PCT: 0 % (ref 0–1)
Basophils Absolute: 0 10*3/uL (ref 0.0–0.1)
Eosinophils Absolute: 0.2 10*3/uL (ref 0.0–0.7)
Eosinophils Relative: 4 % (ref 0–5)
HEMATOCRIT: 36.2 % (ref 36.0–46.0)
Hemoglobin: 12.4 g/dL (ref 12.0–15.0)
LYMPHS PCT: 32 % (ref 12–46)
Lymphs Abs: 1.3 10*3/uL (ref 0.7–4.0)
MCH: 30.1 pg (ref 26.0–34.0)
MCHC: 34.3 g/dL (ref 30.0–36.0)
MCV: 87.9 fL (ref 78.0–100.0)
Monocytes Absolute: 0.4 10*3/uL (ref 0.1–1.0)
Monocytes Relative: 10 % (ref 3–12)
NEUTROS ABS: 2.3 10*3/uL (ref 1.7–7.7)
Neutrophils Relative %: 54 % (ref 43–77)
Platelets: 247 10*3/uL (ref 150–400)
RBC: 4.12 MIL/uL (ref 3.87–5.11)
RDW: 14 % (ref 11.5–15.5)
WBC: 4.2 10*3/uL (ref 4.0–10.5)

## 2013-06-02 LAB — LIPID PANEL
CHOLESTEROL: 231 mg/dL — AB (ref 0–200)
HDL: 57 mg/dL (ref >39)
LDL Cholesterol: 135 mg/dL — ABNORMAL HIGH (ref 0–99)
Total CHOL/HDL Ratio: 4.1 Ratio
Triglycerides: 197 mg/dL — ABNORMAL HIGH (ref <150)
VLDL: 39 mg/dL (ref 0–40)

## 2013-06-02 LAB — LIPASE: Lipase: 169 U/L — ABNORMAL HIGH (ref 0–75)

## 2013-06-02 NOTE — Progress Notes (Signed)
   Subjective:    Patient ID: [name] [name], female    DOB: 06/12/1955, 58 y.o.   MRN: [id number]  HPI    Review of Systems  Constitutional: Negative.   HENT: Negative.   Respiratory: Negative.   Cardiovascular: Negative.   Gastrointestinal: Negative.   Genitourinary: Negative.   Musculoskeletal: Negative for back pain and neck pain.  Skin: Negative.   Neurological: Negative.   Psychiatric/Behavioral: Negative.        Objective:   Physical Exam  Constitutional: She is oriented to person, place, and time.  HENT:  Mouth/Throat: Oropharynx is clear and moist.  Eyes: Right eye exhibits no discharge. No scleral icterus.  Cardiovascular: Normal rate, regular rhythm, normal heart sounds and intact distal pulses.   Pulmonary/Chest: Effort normal and breath sounds normal.  Abdominal: Soft. Bowel sounds are normal.  Musculoskeletal: She exhibits no edema.  Lymphadenopathy:    She has no cervical adenopathy.  Neurological: She is alert and oriented to person, place, and time.  Skin: Skin is warm and dry.  Psychiatric: She has a normal mood and affect.          Assessment & Plan:

## 2013-06-02 NOTE — Progress Notes (Signed)
Patient here for week 14 A5325 study visit. She has not gotten any zantac to try for her heartburn. She says she hasn't had the money. Her heartburn is still occasional and she is trying to watch what she eats. She will have just 2 more weeks on the study drug. She has noticed her lips being really dry but has not had any other symptoms she could associate with the isotretinoin.

## 2013-06-08 ENCOUNTER — Other Ambulatory Visit: Payer: Self-pay | Admitting: Internal Medicine

## 2013-06-15 ENCOUNTER — Encounter: Payer: Self-pay | Admitting: *Deleted

## 2013-06-15 ENCOUNTER — Ambulatory Visit (INDEPENDENT_AMBULATORY_CARE_PROVIDER_SITE_OTHER): Payer: Medicare HMO | Admitting: *Deleted

## 2013-06-15 VITALS — BP 130/86 | HR 82 | Temp 97.9°F | Resp 16 | Wt 180.5 lb

## 2013-06-15 DIAGNOSIS — B20 Human immunodeficiency virus [HIV] disease: Secondary | ICD-10-CM

## 2013-06-15 DIAGNOSIS — Z21 Asymptomatic human immunodeficiency virus [HIV] infection status: Secondary | ICD-10-CM

## 2013-06-15 LAB — HEMOGLOBIN A1C
HEMOGLOBIN A1C: 5.9 % — AB (ref <5.7)
Mean Plasma Glucose: 123 mg/dL — ABNORMAL HIGH (ref <117)

## 2013-06-15 NOTE — Progress Notes (Signed)
[name] [name] is here for A5325, week 17. Her last dose of isotretinoin was yesterday morning. New symptoms includes dry skin to her arms bilaterally. She continues to have dryness on her face. She did mention that she had abdominal discomfort following the day she spoke with Maudie Mercury about her elevated lipase. It lasted through the night and ceased the next morning. She has had no other abdominal issues. Questionnaires completed. Vital signs stable, and fasting labs obtained. She returned her study medication. She received $50 gift card for visit and two bus passes. Next appointment scheduled for Tuesday, Jul 13, 2013 @10am . Eliezer Champagne RN

## 2013-06-16 LAB — HEPATIC FUNCTION PANEL
ALBUMIN: 3.9 g/dL (ref 3.5–5.2)
ALT: 13 U/L (ref 0–35)
AST: 18 U/L (ref 0–37)
Alkaline Phosphatase: 85 U/L (ref 39–117)
Bilirubin, Direct: 0.2 mg/dL (ref 0.0–0.3)
Indirect Bilirubin: 1.5 mg/dL — ABNORMAL HIGH (ref 0.2–1.2)
Total Bilirubin: 1.7 mg/dL — ABNORMAL HIGH (ref 0.2–1.2)
Total Protein: 7.6 g/dL (ref 6.0–8.3)

## 2013-06-16 LAB — LIPID PANEL
Cholesterol: 212 mg/dL — ABNORMAL HIGH (ref 0–200)
HDL: 50 mg/dL (ref >39)
LDL Cholesterol: 135 mg/dL — ABNORMAL HIGH (ref 0–99)
TRIGLYCERIDES: 136 mg/dL (ref <150)
Total CHOL/HDL Ratio: 4.2 Ratio
VLDL: 27 mg/dL (ref 0–40)

## 2013-06-16 LAB — BASIC METABOLIC PANEL
BUN: 10 mg/dL (ref 6–23)
CO2: 33 meq/L — AB (ref 19–32)
CREATININE: 0.64 mg/dL (ref 0.50–1.10)
Calcium: 9.1 mg/dL (ref 8.4–10.5)
Chloride: 98 mEq/L (ref 96–112)
Glucose, Bld: 81 mg/dL (ref 70–99)
Potassium: 3 mEq/L — ABNORMAL LOW (ref 3.5–5.3)
Sodium: 143 mEq/L (ref 135–145)

## 2013-06-16 LAB — LIPASE: Lipase: 196 U/L — ABNORMAL HIGH (ref 0–75)

## 2013-06-23 ENCOUNTER — Ambulatory Visit (HOSPITAL_COMMUNITY)
Admission: RE | Admit: 2013-06-23 | Discharge: 2013-06-23 | Disposition: A | Discharge status: Home / Self Care | Payer: Medicare Other | Source: Ambulatory Visit | Attending: Internal Medicine | Admitting: Internal Medicine

## 2013-06-23 DIAGNOSIS — Z1231 Encounter for screening mammogram for malignant neoplasm of breast: Secondary | ICD-10-CM | POA: Insufficient documentation

## 2013-06-24 ENCOUNTER — Other Ambulatory Visit: Payer: Self-pay | Admitting: Internal Medicine

## 2013-06-24 DIAGNOSIS — R928 Other abnormal and inconclusive findings on diagnostic imaging of breast: Secondary | ICD-10-CM

## 2013-06-30 ENCOUNTER — Encounter: Payer: Self-pay | Admitting: Internal Medicine

## 2013-06-30 LAB — CD4/CD8 (T-HELPER/T-SUPPRESSOR CELL)
CD4%: 23.2
CD4: 232
CD8 T CELL SUPPRESSOR: 37.4
CD8: 374

## 2013-06-30 LAB — HIV-1 RNA QUANT-NO REFLEX-BLD: HIV-1 RNA Viral Load: <40

## 2013-07-01 ENCOUNTER — Ambulatory Visit (INDEPENDENT_AMBULATORY_CARE_PROVIDER_SITE_OTHER): Payer: Medicare Other | Admitting: Internal Medicine

## 2013-07-01 ENCOUNTER — Encounter: Payer: Self-pay | Admitting: Internal Medicine

## 2013-07-01 ENCOUNTER — Ambulatory Visit: Payer: Medicare Other | Admitting: Internal Medicine

## 2013-07-01 VITALS — BP 165/89 | HR 80 | Temp 98.0°F | Ht 63.25 in | Wt 183.0 lb

## 2013-07-01 DIAGNOSIS — I10 Essential (primary) hypertension: Secondary | ICD-10-CM

## 2013-07-01 MED ORDER — LISINOPRIL 10 MG PO TABS: 10.0000 mg | ORAL_TABLET | Freq: Every day | ORAL | Status: DC

## 2013-07-01 MED ORDER — DARUNAVIR-COBICISTAT 800-150 MG PO TABS: 1.0000 | ORAL_TABLET | Freq: Every day | ORAL | Status: DC

## 2013-07-01 NOTE — Progress Notes (Signed)
Patient ID: [name] [name], female   DOB: Mar 17, 1955, 58 y.o.   MRN: [id number] @LOGODEPT         Patient Active Problem List   Diagnosis Date Noted  . HIV INFECTION 08/12/2006    Priority: High  . Dyslipidemia 03/30/2013    Priority: Medium  . HYPERTENSION 03/29/2009    Priority: Medium  . Acid reflux 05/18/2013  . Rectal bleeding 06/26/2010  . LOW BACK PAIN, ACUTE 01/16/2010  . LESION, VAGINA 10/05/2009  . PAPANICOLAOU SMEAR OF VAGINA WITH LGSIL 01/25/2009  . VIRAL MENINGITIS 09/14/2008  . DEPRESSION 03/22/2008  . DENTAL CARIES 09/21/2007  . GROSS HEMATURIA 08/26/2007  . DEGENERATIVE JOINT DISEASE 08/12/2006  . GENITAL HERPES 01/13/2006  . ANEMIA 01/13/2006  . DEPENDENCE, COCAINE, UNSPECIFIED 01/13/2006  . ALLERGIC RHINITIS 01/13/2006  . GERD 01/13/2006  . PREGNANCY, ECTOPIC NOS W/O INTRAUTERINE PRG 01/13/2006  . ABORTION NOS W/PELVIC DAMAGE, UNSPECIFIED 01/13/2006  . ANGIOEDEMA 01/13/2006  . BUNIONECTOMY, HX OF 01/13/2006    Patient's Medications  New Prescriptions   DARUNAVIR-COBICISTAT (PREZCOBIX) 800-150 MG PER TABLET    Take 1 tablet by mouth daily. Swallow whole. Do NOT crush, break or chew tablets. Take with food.   LISINOPRIL (PRINIVIL,ZESTRIL) 10 MG TABLET    Take 1 tablet (10 mg total) by mouth daily.  Previous Medications   ABACAVIR (ZIAGEN) 300 MG TABLET    TAKE 1 TABLET BY MOUTH TWICE DAILY   ACYCLOVIR OINTMENT (ZOVIRAX) 5 %    Apply topically every 3 (three) hours.   FAMOTIDINE (PEPCID) 40 MG TABLET    Take 1 tablet (40 mg total) by mouth daily.   NAPROXEN SODIUM (ANAPROX) 220 MG TABLET    Take 220 mg by mouth 2 (two) times daily with a meal.   TENOFOVIR (VIREAD) 300 MG TABLET    Take 1 tablet (300 mg total) by mouth daily.   VALACYCLOVIR (VALTREX) 500 MG TABLET    Take 1 tablet (500 mg total) by mouth 3 (three) times daily.  Modified Medications   No medications on file  Discontinued Medications   ATAZANAVIR (REYATAZ) 300 MG CAPSULE    Take 1 capsule  (300 mg total) by mouth daily.   HYDROCHLOROTHIAZIDE (HYDRODIURIL) 25 MG TABLET    Take 1 tablet (25 mg total) by mouth daily.   ISOTRETINOIN (ACCUTANE) 40 MG CAPSULE    Take 1 capsule (40 mg total) by mouth daily.   RITONAVIR (NORVIR) 100 MG TABS TABLET    Take 1 tablet (100 mg total) by mouth daily.    Subjective: [name] [name] is in for a routine visit. She is off her study drugs now. She is feeling very well. She's had no more neck pain. Her acid reflux has resolved since starting Pepcid. She has not missed any doses of her other medications. Review of Systems: A comprehensive review of systems was negative.  Past Medical History  Diagnosis Date  . HIV (human immunodeficiency virus infection) 2007  . Hypertension   . GERD (gastroesophageal reflux disease)     History  Substance Use Topics  . Smoking status: Never Smoker   . Smokeless tobacco: Never Used  . Alcohol Use: No    Family History  Problem Relation Age of Onset  . Coronary artery disease Mother   . Hypertension Mother   . Anxiety disorder Mother   . Arthritis Mother     rheumatoid  . Cancer Father     possible prostate cancer  . Diabetes Sister     Allergies  Allergen Reactions  . Sulfa Antibiotics Other (See Comments)    Blood pressure drops.  . Penicillins Rash    Objective: Temp: 98 F (36.7 C) (04/23 0952) Temp src: Oral (04/23 0952) BP: 165/89 mmHg (04/23 0952) Pulse Rate: 80 (04/23 0952) Body mass index is 32.14 kg/(m^2).  General: She is smiling and in good spirits Oral: No oropharyngeal lesions Skin: No rash Lungs: Clear Cor: Regular S1 and S2 with no murmurs Abdomen: Obese, soft and nontender  Lab Results Lab Results  Component Value Date   WBC 4.2 06/02/2013   HGB 12.4 06/02/2013   HCT 36.2 06/02/2013   MCV 87.9 06/02/2013   PLT 247 06/02/2013    Lab Results  Component Value Date   CREATININE 0.64 06/15/2013   BUN 10 06/15/2013   NA 143 06/15/2013   K 3.0* 06/15/2013   CL 98 06/15/2013    CO2 33* 06/15/2013    Lab Results  Component Value Date   ALT 13 06/15/2013   AST 18 06/15/2013   ALKPHOS 85 06/15/2013   BILITOT 1.7* 06/15/2013    Lab Results  Component Value Date   CHOL 212* 06/15/2013   HDL 50 06/15/2013   LDLCALC 135* 06/15/2013   TRIG 136 06/15/2013   CHOLHDL 4.2 06/15/2013    Lab Results HIV 1 RNA Quant (copies/mL)  Date Value  01/26/2013 <20   08/24/2012 <20   02/13/2012 <20      HIV-1 RNA Viral Load (no units)  Date Value  06/15/2013 <40   04/20/2013 <40   02/23/2013 <40      CD4 T Cell Abs (cmm)  Date Value  08/24/2012 260*  02/13/2012 330*  12/05/2011 250*     Assessment: For HIV infection is under very good control. I will simplify her regimen by changing Reyataz and Norvir to Prezcobix. This will also help avoid drug drug interactions between Reyataz on acid blocking drugs.  Her blood pressure remains up and she is hypokalemic on hydrochlorothiazide. I will switch her to lisinopril and see her back in one month.  Her lipase has been trending up but she has no clinical evidence of pancreatitis.  Plan: 1. Change antiretrovirals and into Viread, Ziagen and Prezcobix 2. Change hydrochlorothiazide to lisinopril 3. Followup in one month   Michel Bickers, MD [facility] for [place] [phone] pager   [phone] cell 07/01/2013, 10:15 AM

## 2013-07-05 ENCOUNTER — Ambulatory Visit
Admission: RE | Admit: 2013-07-05 | Discharge: 2013-07-05 | Disposition: A | Discharge status: Home / Self Care | Payer: Medicare HMO | Source: Ambulatory Visit | Attending: Internal Medicine | Admitting: Internal Medicine

## 2013-07-05 ENCOUNTER — Telehealth: Payer: Self-pay | Admitting: *Deleted

## 2013-07-05 ENCOUNTER — Other Ambulatory Visit: Payer: Self-pay | Admitting: *Deleted

## 2013-07-05 DIAGNOSIS — R928 Other abnormal and inconclusive findings on diagnostic imaging of breast: Secondary | ICD-10-CM

## 2013-07-05 NOTE — Telephone Encounter (Signed)
[name] [name] called and said Dr. Megan Salon was changing her ARVs around and wanted to find out the names of the discontinued ones and the new BP medicine. I told her the study needs her to wait until it is over at the end of June to wait to switch as that affect the study outcomes. I will check with Dr. Megan Salon and see if we can wait until then to switch. She had forgotten that she was still on study and has 3 more study visits to go for followup.

## 2013-07-05 NOTE — Telephone Encounter (Signed)
Pt requesting the names of her new medications.  Lost her paperwork from last OV.   Pt will need to call [place] # [phone] to order these new rxes to be sent to her home.  These new rxes replace Reyataz/Norvir and HCTZ.  Left message with this information.

## 2013-07-05 NOTE — Telephone Encounter (Signed)
Have here wait to switch meds until she is off study.

## 2013-07-06 ENCOUNTER — Other Ambulatory Visit: Payer: Self-pay | Admitting: Internal Medicine

## 2013-07-06 ENCOUNTER — Other Ambulatory Visit: Payer: Self-pay | Admitting: *Deleted

## 2013-07-13 ENCOUNTER — Ambulatory Visit (INDEPENDENT_AMBULATORY_CARE_PROVIDER_SITE_OTHER): Payer: Self-pay | Admitting: *Deleted

## 2013-07-13 VITALS — BP 136/79 | HR 69 | Temp 98.1°F | Resp 16 | Wt 179.5 lb

## 2013-07-13 DIAGNOSIS — B20 Human immunodeficiency virus [HIV] disease: Secondary | ICD-10-CM

## 2013-07-13 DIAGNOSIS — Z21 Asymptomatic human immunodeficiency virus [HIV] infection status: Secondary | ICD-10-CM

## 2013-07-15 NOTE — Progress Notes (Signed)
[name] [name] is here for A5325 study, week 20. She has been off Isotretinoin since 06/14/2013. The dryness and paleness of her skin has resolved. She denies any other issues. Fasting labs were drawn and vital signs remain stable. She received $50 gift card and 2 bus passes for visit. Next appointment scheduled for August 14, 2013 @ 10am. Araceli Bouche RN

## 2013-07-19 ENCOUNTER — Encounter: Payer: Self-pay | Admitting: Internal Medicine

## 2013-08-17 ENCOUNTER — Ambulatory Visit (INDEPENDENT_AMBULATORY_CARE_PROVIDER_SITE_OTHER): Payer: Medicare Other | Admitting: *Deleted

## 2013-08-17 DIAGNOSIS — Z21 Asymptomatic human immunodeficiency virus [HIV] infection status: Secondary | ICD-10-CM

## 2013-08-17 DIAGNOSIS — B20 Human immunodeficiency virus [HIV] disease: Secondary | ICD-10-CM

## 2013-08-17 NOTE — Progress Notes (Signed)
[name] [name] is here for her week 62 A5325 study visit. She signed the new consent for version 2.0 of the study and has only 1 more visit in 4 weeks for it. She denies any new problems. After she comes in for the next study visit, she plans to switch her HIV meds as Dr. Megan Salon had wanted her to. She is also interested in the Reprieve study and may be eligible depending on her lipids and BP.

## 2013-09-07 ENCOUNTER — Telehealth: Payer: Self-pay | Admitting: *Deleted

## 2013-09-07 NOTE — Telephone Encounter (Signed)
She is not anemic so it is very unlikely that her fatigue is due to iron deficiency. I do not know the cause of her fatigue but she can schedule an appointment with me to review this.

## 2013-09-07 NOTE — Telephone Encounter (Signed)
[name] [name] called this morning and said she had been feeling very fatigued lately and wondered if her iron was low. She has a study appt next week and wondered if we could get an iron level on her then. I told her I would need to check with Dr. Megan Salon first.

## 2013-09-15 ENCOUNTER — Ambulatory Visit (INDEPENDENT_AMBULATORY_CARE_PROVIDER_SITE_OTHER): Payer: Self-pay | Admitting: *Deleted

## 2013-09-15 VITALS — BP 125/82 | HR 82 | Temp 98.2°F | Resp 16 | Wt 175.8 lb

## 2013-09-15 DIAGNOSIS — E782 Mixed hyperlipidemia: Secondary | ICD-10-CM

## 2013-09-15 DIAGNOSIS — Z13220 Encounter for screening for lipoid disorders: Secondary | ICD-10-CM

## 2013-09-15 DIAGNOSIS — B20 Human immunodeficiency virus [HIV] disease: Secondary | ICD-10-CM

## 2013-09-15 DIAGNOSIS — Z006 Encounter for examination for normal comparison and control in clinical research program: Secondary | ICD-10-CM

## 2013-09-15 LAB — HIV-1 RNA QUANT-NO REFLEX-BLD: HIV 1 RNA VIRAL LOAD: 2647

## 2013-09-15 LAB — COMPREHENSIVE METABOLIC PANEL
ALBUMIN: 4.1 g/dL (ref 3.5–5.2)
ALT: 8 U/L (ref 0–35)
AST: 13 U/L (ref 0–37)
Alkaline Phosphatase: 79 U/L (ref 39–117)
BILIRUBIN TOTAL: 0.5 mg/dL (ref 0.2–1.2)
BUN: 9 mg/dL (ref 6–23)
CALCIUM: 9.2 mg/dL (ref 8.4–10.5)
CHLORIDE: 104 meq/L (ref 96–112)
CO2: 27 mEq/L (ref 19–32)
Creat: 0.73 mg/dL (ref 0.50–1.10)
GLUCOSE: 86 mg/dL (ref 70–99)
POTASSIUM: 3.6 meq/L (ref 3.5–5.3)
SODIUM: 141 meq/L (ref 135–145)
Total Protein: 7.8 g/dL (ref 6.0–8.3)

## 2013-09-15 LAB — LIPID PANEL
CHOLESTEROL: 221 mg/dL — AB (ref 0–200)
HDL: 50 mg/dL (ref >39)
LDL Cholesterol: 155 mg/dL — ABNORMAL HIGH (ref 0–99)
TRIGLYCERIDES: 78 mg/dL (ref <150)
Total CHOL/HDL Ratio: 4.4 Ratio
VLDL: 16 mg/dL (ref 0–40)

## 2013-09-15 LAB — CD4/CD8 (T-HELPER/T-SUPPRESSOR CELL)
CD4%: 21.7
CD4: 260
CD8 % Suppressor T Cell: 38.9
CD8: 467

## 2013-09-15 NOTE — Progress Notes (Signed)
   Subjective:    Patient ID: [name] [name], female    DOB: [birth year], 58 y.o.   MRN: [id number]  HPI    Review of Systems  Constitutional: Negative.   Eyes: Negative.   Respiratory: Negative.   Cardiovascular: Negative.   Gastrointestinal: Negative.   Genitourinary: Negative.   Musculoskeletal: Negative.   Skin: Negative.   Neurological: Positive for headaches.  Psychiatric/Behavioral: Negative.        Objective:   Physical Exam  Constitutional: She appears well-developed.  HENT:  Mouth/Throat: Oropharynx is clear and moist.  Eyes: No scleral icterus.  Cardiovascular: Normal rate, regular rhythm, normal heart sounds and intact distal pulses.   Pulmonary/Chest: Effort normal and breath sounds normal.  Abdominal: Soft. Bowel sounds are normal.  Musculoskeletal: Normal range of motion. She exhibits no edema.  Lymphadenopathy:    She has no cervical adenopathy.  Neurological: She is alert.  Skin: Skin is warm and dry.  Psychiatric: She has a normal mood and affect.          Assessment & Plan:

## 2013-09-15 NOTE — Progress Notes (Signed)
[name] [name] is here for 3 study visits: the final visit for A5325, week 72 A5322 and screening for Reprieve. Informed consent was obtained after she had time to review the consent at home and all her questions were answered about the study. She was able to verbalize what the study was about and what she was required to do for it. She says that the past month her abdomen had been sore and crampy for a couple weeks but has now resolved. Otherwise she doesn't have any new complaints. She still has some left foot numbness, occassional headache, etc. She will return in November for E0814 and in August for possible enrollment into Reprieve.

## 2013-09-16 ENCOUNTER — Encounter (HOSPITAL_COMMUNITY): Payer: Self-pay | Admitting: Emergency Medicine

## 2013-09-16 ENCOUNTER — Emergency Department (HOSPITAL_COMMUNITY)
Admission: EM | Admit: 2013-09-16 | Discharge: 2013-09-16 | Disposition: A | Discharge status: Home / Self Care | Payer: Medicare HMO | Attending: Emergency Medicine | Admitting: Emergency Medicine

## 2013-09-16 DIAGNOSIS — K219 Gastro-esophageal reflux disease without esophagitis: Secondary | ICD-10-CM | POA: Insufficient documentation

## 2013-09-16 DIAGNOSIS — I10 Essential (primary) hypertension: Secondary | ICD-10-CM | POA: Insufficient documentation

## 2013-09-16 DIAGNOSIS — R519 Headache, unspecified: Secondary | ICD-10-CM

## 2013-09-16 DIAGNOSIS — Z79899 Other long term (current) drug therapy: Secondary | ICD-10-CM | POA: Insufficient documentation

## 2013-09-16 DIAGNOSIS — Z880 Allergy status to penicillin: Secondary | ICD-10-CM | POA: Insufficient documentation

## 2013-09-16 DIAGNOSIS — R51 Headache: Secondary | ICD-10-CM | POA: Insufficient documentation

## 2013-09-16 DIAGNOSIS — Z21 Asymptomatic human immunodeficiency virus [HIV] infection status: Secondary | ICD-10-CM | POA: Insufficient documentation

## 2013-09-16 LAB — COMPREHENSIVE METABOLIC PANEL
ALT: 8 U/L (ref 0–35)
AST: 13 U/L (ref 0–37)
Albumin: 3.6 g/dL (ref 3.5–5.2)
Alkaline Phosphatase: 78 U/L (ref 39–117)
Anion gap: 13 (ref 5–15)
BUN: 8 mg/dL (ref 6–23)
CALCIUM: 8.9 mg/dL (ref 8.4–10.5)
CO2: 26 mEq/L (ref 19–32)
CREATININE: 0.73 mg/dL (ref 0.50–1.10)
Chloride: 102 mEq/L (ref 96–112)
GFR calc non Af Amer: >90 mL/min (ref >90)
Glucose, Bld: 84 mg/dL (ref 70–99)
Potassium: 3.6 mEq/L — ABNORMAL LOW (ref 3.7–5.3)
SODIUM: 141 meq/L (ref 137–147)
Total Bilirubin: 1.4 mg/dL — ABNORMAL HIGH (ref 0.3–1.2)
Total Protein: 7.8 g/dL (ref 6.0–8.3)

## 2013-09-16 LAB — CBC WITH DIFFERENTIAL/PLATELET
BASOS ABS: 0 10*3/uL (ref 0.0–0.1)
Basophils Relative: 0 % (ref 0–1)
EOS ABS: 0.1 10*3/uL (ref 0.0–0.7)
EOS PCT: 2 % (ref 0–5)
HEMATOCRIT: 35.4 % — AB (ref 36.0–46.0)
Hemoglobin: 11.7 g/dL — ABNORMAL LOW (ref 12.0–15.0)
Lymphocytes Relative: 41 % (ref 12–46)
Lymphs Abs: 1.6 10*3/uL (ref 0.7–4.0)
MCH: 29.5 pg (ref 26.0–34.0)
MCHC: 33.1 g/dL (ref 30.0–36.0)
MCV: 89.2 fL (ref 78.0–100.0)
Monocytes Absolute: 0.4 10*3/uL (ref 0.1–1.0)
Monocytes Relative: 10 % (ref 3–12)
Neutro Abs: 1.9 10*3/uL (ref 1.7–7.7)
Neutrophils Relative %: 47 % (ref 43–77)
PLATELETS: 197 10*3/uL (ref 150–400)
RBC: 3.97 MIL/uL (ref 3.87–5.11)
RDW: 13.5 % (ref 11.5–15.5)
WBC: 4 10*3/uL (ref 4.0–10.5)

## 2013-09-16 MED ORDER — KETOROLAC TROMETHAMINE 30 MG/ML IJ SOLN
30.0000 mg | Freq: Once | INTRAMUSCULAR | Status: AC
  Administered 2013-09-16: 30 mg via INTRAVENOUS
  Filled 2013-09-16: qty 1

## 2013-09-16 MED ORDER — DIAZEPAM 2 MG PO TABS: 2.0000 mg | ORAL_TABLET | Freq: Two times a day (BID) | ORAL | Status: DC | PRN

## 2013-09-16 MED ORDER — METOCLOPRAMIDE HCL 5 MG/ML IJ SOLN
10.0000 mg | Freq: Once | INTRAMUSCULAR | Status: AC
  Administered 2013-09-16: 10 mg via INTRAVENOUS
  Filled 2013-09-16: qty 2

## 2013-09-16 MED ORDER — SODIUM CHLORIDE 0.9 % IV BOLUS (SEPSIS)
1000.0000 mL | Freq: Once | INTRAVENOUS | Status: AC
  Administered 2013-09-16: 1000 mL via INTRAVENOUS

## 2013-09-16 MED ORDER — DIPHENHYDRAMINE HCL 50 MG/ML IJ SOLN
25.0000 mg | Freq: Once | INTRAMUSCULAR | Status: AC
  Administered 2013-09-16: 25 mg via INTRAVENOUS
  Filled 2013-09-16: qty 1

## 2013-09-16 NOTE — Discharge Instructions (Signed)
As discussed, your evaluation today has been largely reassuring.  But, it is important that you monitor your condition carefully, and do not hesitate to return to the ED if you develop new, or concerning changes in your condition.  Please be sure to discuss your headaches, and how your symptoms progress with the new medication with your physician.  Otherwise, please follow-up with your physician for appropriate ongoing care.

## 2013-09-16 NOTE — ED Provider Notes (Signed)
CSN: [id number]     Arrival date & time 09/16/13  1045 History   First MD Initiated Contact with Patient 09/16/13 1324     Chief Complaint  Patient presents with  . Headache  . Torticollis    HPI  Patient presents with headache, neck pain.  Symptoms began 2 days ago.  Pain is focally about the superior head, right trapezius area.  Pain is nonradiating, sore, tight with no associated visual loss, vomiting, confusion, disorientation, limited range of motion of the neck, fever, chills. Patient has not taken anything for pain control. She states that she was generally well prior to the onset of symptoms relent she states her HIV is generally well controlled. She has a history of similar prior episodes in the distant past.   Past Medical History  Diagnosis Date  . HIV (human immunodeficiency virus infection) 2007  . Hypertension   . GERD (gastroesophageal reflux disease)    Past Surgical History  Procedure Laterality Date  . Ectopic pregnancy surgery      s/p right salpinooophrectomy  . Bunionectomy    . Uterine fibroid surgery     Family History  Problem Relation Age of Onset  . Coronary artery disease Mother   . Hypertension Mother   . Anxiety disorder Mother   . Arthritis Mother     rheumatoid  . Cancer Father     possible prostate cancer  . Diabetes Sister    History  Substance Use Topics  . Smoking status: Never Smoker   . Smokeless tobacco: Never Used  . Alcohol Use: No   OB History   Grav Para Term Preterm Abortions TAB SAB Ect Mult Living                 Review of Systems  Constitutional:       Per HPI, otherwise negative  HENT:       Per HPI, otherwise negative  Respiratory:       Per HPI, otherwise negative  Cardiovascular:       Per HPI, otherwise negative  Gastrointestinal: Negative for vomiting.  Endocrine:       Negative aside from HPI  Genitourinary:       Neg aside from HPI   Musculoskeletal:       Per HPI, otherwise negative  Skin:  Negative.   Neurological: Positive for headaches. Negative for syncope and weakness.      Allergies  Sulfa antibiotics and Penicillins  Home Medications   Prior to Admission medications   Medication Sig Start Date End Date Taking? Authorizing Provider  abacavir (ZIAGEN) 300 MG tablet Take 600 mg by mouth at bedtime.   Yes Historical Provider, MD  famotidine (PEPCID) 40 MG tablet Take 1 tablet (40 mg total) by mouth daily. 05/18/13  Yes Michel Bickers, MD  lisinopril (PRINIVIL,ZESTRIL) 10 MG tablet Take 10 mg by mouth daily.   Yes Historical Provider, MD  ritonavir (NORVIR) 100 MG capsule Take 100 mg by mouth at bedtime.   Yes Historical Provider, MD  tenofovir (VIREAD) 300 MG tablet Take 300 mg by mouth at bedtime.   Yes Historical Provider, MD  valACYclovir (VALTREX) 500 MG tablet Take 500 mg by mouth 3 (three) times daily as needed (outbreak).   Yes Historical Provider, MD   BP 135/91  Pulse 86  Temp(Src) 98.3 F (36.8 C) (Oral)  Resp 18  SpO2 100% Physical Exam  Nursing note and vitals reviewed. Constitutional: She is oriented to person, place, and time.  She appears well-developed and well-nourished. No distress.  HENT:  Head: Normocephalic and atraumatic.  Eyes: Conjunctivae and EOM are normal.  Cardiovascular: Normal rate and regular rhythm.   Pulmonary/Chest: Effort normal and breath sounds normal. No stridor. No respiratory distress.  Abdominal: She exhibits no distension.  Musculoskeletal: She exhibits no edema.  Neurological: She is alert and oriented to person, place, and time. She displays no atrophy and no tremor. No cranial nerve deficit or sensory deficit. She exhibits normal muscle tone. She displays no seizure activity. Coordination normal.  Skin: Skin is warm and dry.  Psychiatric: She has a normal mood and affect.    ED Course  Procedures (including critical care time) Labs Review Labs Reviewed  CBC WITH DIFFERENTIAL - Abnormal; Notable for the following:     Hemoglobin 11.7 (*)    HCT 35.4 (*)    All other components within normal limits  COMPREHENSIVE METABOLIC PANEL - Abnormal; Notable for the following:    Potassium 3.6 (*)    Total Bilirubin 1.4 (*)    All other components within normal limits    I reviewed the EMR.   3:55 PM    MDM   Patient with multiple medical problems, including HIV presents with headache.  Patient is neurologically intact and in no distress, hemodynamically stable.  Patient had substantial improvement in her condition her.  Low suspicion for meningitis or other acute infectious or endocrine pathology. Patient will follow up with her infectious disease team.   Carmin Muskrat, MD 09/16/13 [phone]

## 2013-09-16 NOTE — ED Notes (Signed)
IV has been removed from rt arm, 2x2 gauze applied with tape.

## 2013-09-16 NOTE — ED Notes (Signed)
Per pt sts that she has been having right sided neck pain and scalp is sore to touch with HA for a few days. sts yesterday she had some nausea.

## 2013-09-30 ENCOUNTER — Encounter: Payer: Self-pay | Admitting: Internal Medicine

## 2013-09-30 ENCOUNTER — Telehealth: Payer: Self-pay | Admitting: *Deleted

## 2013-10-05 ENCOUNTER — Other Ambulatory Visit: Payer: Self-pay | Admitting: *Deleted

## 2013-10-05 ENCOUNTER — Telehealth: Payer: Self-pay | Admitting: *Deleted

## 2013-10-05 DIAGNOSIS — B20 Human immunodeficiency virus [HIV] disease: Secondary | ICD-10-CM

## 2013-10-05 NOTE — Telephone Encounter (Signed)
Called [name] [name] and talked to her about her elevated viral load (2000s) . She said she had not been missing any doses of her hiv meds. She is still on her old regimen and was going to switch to the new one Dr. Megan Salon had prescribed when she ran out of the original meds. She is complaining of fatigue and joint aches, especially her back and muscle spasms. She will try to come in over the next 2 weeks and get the lab redrawn but doesn't have enough money for bus fare. She does have a study visit for entry into the Reprieve study on the 10th of August and we can redraw the VL then if she doesn't come in sooner.

## 2013-10-11 ENCOUNTER — Other Ambulatory Visit (INDEPENDENT_AMBULATORY_CARE_PROVIDER_SITE_OTHER): Payer: Medicare Other

## 2013-10-11 ENCOUNTER — Other Ambulatory Visit: Payer: Self-pay | Admitting: Internal Medicine

## 2013-10-11 DIAGNOSIS — B20 Human immunodeficiency virus [HIV] disease: Secondary | ICD-10-CM

## 2013-10-11 LAB — CBC
HCT: 35.8 % — ABNORMAL LOW (ref 36.0–46.0)
Hemoglobin: 11.9 g/dL — ABNORMAL LOW (ref 12.0–15.0)
MCH: 28.9 pg (ref 26.0–34.0)
MCHC: 33.2 g/dL (ref 30.0–36.0)
MCV: 86.9 fL (ref 78.0–100.0)
Platelets: 223 10*3/uL (ref 150–400)
RBC: 4.12 MIL/uL (ref 3.87–5.11)
RDW: 14.7 % (ref 11.5–15.5)
WBC: 3.8 10*3/uL — ABNORMAL LOW (ref 4.0–10.5)

## 2013-10-12 LAB — COMPREHENSIVE METABOLIC PANEL
ALT: 8 U/L (ref 0–35)
AST: 14 U/L (ref 0–37)
Albumin: 3.9 g/dL (ref 3.5–5.2)
Alkaline Phosphatase: 62 U/L (ref 39–117)
BILIRUBIN TOTAL: 1.6 mg/dL — AB (ref 0.2–1.2)
BUN: 11 mg/dL (ref 6–23)
CO2: 28 meq/L (ref 19–32)
CREATININE: 0.67 mg/dL (ref 0.50–1.10)
Calcium: 8.8 mg/dL (ref 8.4–10.5)
Chloride: 102 mEq/L (ref 96–112)
GLUCOSE: 82 mg/dL (ref 70–99)
Potassium: 3.2 mEq/L — ABNORMAL LOW (ref 3.5–5.3)
Sodium: 140 mEq/L (ref 135–145)
Total Protein: 7.1 g/dL (ref 6.0–8.3)

## 2013-10-12 LAB — LIPID PANEL
CHOLESTEROL: 183 mg/dL (ref 0–200)
HDL: 45 mg/dL (ref >39)
LDL Cholesterol: 116 mg/dL — ABNORMAL HIGH (ref 0–99)
TRIGLYCERIDES: 110 mg/dL (ref <150)
Total CHOL/HDL Ratio: 4.1 Ratio
VLDL: 22 mg/dL (ref 0–40)

## 2013-10-12 LAB — RPR

## 2013-10-12 LAB — T-HELPER CELL (CD4) - (RCID CLINIC ONLY)
CD4 T CELL HELPER: 21 % — AB (ref 33–55)
CD4 T Cell Abs: 280 /uL — ABNORMAL LOW (ref 400–2700)

## 2013-10-13 ENCOUNTER — Encounter: Payer: Self-pay | Admitting: Internal Medicine

## 2013-10-13 LAB — HIV-1 RNA QUANT-NO REFLEX-BLD
HIV 1 RNA QUANT: 1508 {copies}/mL — AB (ref <20)
HIV 1 RNA VIRAL LOAD: 2647
HIV-1 RNA Quant, Log: 3.18 {Log} — ABNORMAL HIGH (ref <1.30)

## 2013-10-13 LAB — CD4/CD8 (T-HELPER/T-SUPPRESSOR CELL)
CD4%: 21.7
CD4: 260
CD8 T CELL SUPPRESSOR: 38.9
CD8: 467

## 2013-10-18 ENCOUNTER — Ambulatory Visit (INDEPENDENT_AMBULATORY_CARE_PROVIDER_SITE_OTHER): Payer: Self-pay | Admitting: *Deleted

## 2013-10-18 ENCOUNTER — Encounter: Payer: Self-pay | Admitting: *Deleted

## 2013-10-18 VITALS — BP 137/76 | HR 61 | Temp 98.1°F | Resp 16 | Wt 173.8 lb

## 2013-10-18 DIAGNOSIS — Z006 Encounter for examination for normal comparison and control in clinical research program: Secondary | ICD-10-CM

## 2013-10-18 DIAGNOSIS — B20 Human immunodeficiency virus [HIV] disease: Secondary | ICD-10-CM

## 2013-10-18 MED ORDER — PITAVASTATIN CALCIUM 4 MG PO TABS: 4.0000 mg | ORAL_TABLET | Freq: Every day | ORAL | Status: AC

## 2013-10-18 NOTE — Progress Notes (Signed)
[name] [name] is here for [phone] entry visit (Pitivastatin/Placebo study). [name] [name] was seen in ED on 09/16/2013 for head ache/neck pain. She received Valium and took for 5 days. Currently the headaches and neck pain have stopped. Fasting labs were drawn and vital signs are stable. Questionnaires were completed. Study medication was dispensed and we discussed the proper administration and potential side effects. She verbalized understanding. She received $50 gift card for study visit as well as 2 bus passes. Next appointment scheduled for November 18, 2013 at 9:30am [place]

## 2013-10-26 ENCOUNTER — Other Ambulatory Visit: Payer: Self-pay | Admitting: Internal Medicine

## 2013-10-26 DIAGNOSIS — B20 Human immunodeficiency virus [HIV] disease: Secondary | ICD-10-CM

## 2013-10-26 MED ORDER — DARUNAVIR-COBICISTAT 800-150 MG PO TABS: 1.0000 | ORAL_TABLET | Freq: Every day | ORAL | Status: DC

## 2013-10-26 NOTE — Addendum Note (Signed)
Addended by: Dolan Amen D on: 10/26/2013 11:14 AM   Modules accepted: Orders

## 2013-11-08 ENCOUNTER — Encounter: Payer: Self-pay | Admitting: Internal Medicine

## 2013-11-09 ENCOUNTER — Encounter: Payer: Self-pay | Admitting: Internal Medicine

## 2013-11-09 ENCOUNTER — Ambulatory Visit (INDEPENDENT_AMBULATORY_CARE_PROVIDER_SITE_OTHER): Payer: Medicare Other | Admitting: Internal Medicine

## 2013-11-09 VITALS — BP 152/91 | HR 77 | Temp 98.3°F | Wt 180.5 lb

## 2013-11-09 DIAGNOSIS — B20 Human immunodeficiency virus [HIV] disease: Secondary | ICD-10-CM

## 2013-11-09 DIAGNOSIS — Z23 Encounter for immunization: Secondary | ICD-10-CM

## 2013-11-09 LAB — HIV-1 GENOTYPR PLUS

## 2013-11-09 NOTE — Progress Notes (Signed)
Patient ID: [name] [name], female   DOB: [birth year], 58 y.o.   MRN: [id number]          Patient Active Problem List   Diagnosis Date Noted  . HIV INFECTION 08/12/2006    Priority: High  . Dyslipidemia 03/30/2013    Priority: Medium  . HYPERTENSION 03/29/2009    Priority: Medium  . Acid reflux 05/18/2013  . Rectal bleeding 06/26/2010  . LOW BACK PAIN, ACUTE 01/16/2010  . LESION, VAGINA 10/05/2009  . PAPANICOLAOU SMEAR OF VAGINA WITH LGSIL 01/25/2009  . VIRAL MENINGITIS 09/14/2008  . DEPRESSION 03/22/2008  . DENTAL CARIES 09/21/2007  . GROSS HEMATURIA 08/26/2007  . DEGENERATIVE JOINT DISEASE 08/12/2006  . GENITAL HERPES 01/13/2006  . ANEMIA 01/13/2006  . DEPENDENCE, COCAINE, UNSPECIFIED 01/13/2006  . ALLERGIC RHINITIS 01/13/2006  . GERD 01/13/2006  . PREGNANCY, ECTOPIC NOS W/O INTRAUTERINE PRG 01/13/2006  . ABORTION NOS W/PELVIC DAMAGE, UNSPECIFIED 01/13/2006  . ANGIOEDEMA 01/13/2006  . BUNIONECTOMY, HX OF 01/13/2006    Patient's Medications  New Prescriptions   No medications on file  Previous Medications   ABACAVIR (ZIAGEN) 300 MG TABLET    Take 600 mg by mouth at bedtime.   DARUNAVIR-COBICISTAT (PREZCOBIX) 800-150 MG PER TABLET    Take 1 tablet by mouth daily. Swallow whole. Do NOT crush, break or chew tablets. Take with food.   DIAZEPAM (VALIUM) 2 MG TABLET    Take 1 tablet (2 mg total) by mouth every 12 (twelve) hours as needed for muscle spasms.   FAMOTIDINE (PEPCID) 40 MG TABLET    Take 1 tablet (40 mg total) by mouth daily.   LISINOPRIL (PRINIVIL,ZESTRIL) 10 MG TABLET    Take 10 mg by mouth daily.   PITAVASTATIN CALCIUM 4 MG TABS    Take 1 tablet (4 mg total) by mouth daily.   TENOFOVIR (VIREAD) 300 MG TABLET    Take 300 mg by mouth at bedtime.   VALACYCLOVIR (VALTREX) 500 MG TABLET    Take 500 mg by mouth 3 (three) times daily as needed (outbreak).  Modified Medications   No medications on file  Discontinued Medications   No medications on file     Subjective: [name] [name] is seen on a work in basis after her recent viral loads were found to be elevated. She is still taking her previous regimen of Ziagen, Viread, Reyataz and Norvir. She wanted to continue that because she still had plenty remaining. She recalls missing only a few doses since her last visit when her mail order pharmacy was late delivering her medications. She's not having any problems tolerating her medications. Review of Systems: Pertinent items are noted in HPI.  Past Medical History  Diagnosis Date  . HIV (human immunodeficiency virus infection) 2007  . Hypertension   . GERD (gastroesophageal reflux disease)     History  Substance Use Topics  . Smoking status: Never Smoker   . Smokeless tobacco: Never Used  . Alcohol Use: No    Family History  Problem Relation Age of Onset  . Coronary artery disease Mother   . Hypertension Mother   . Anxiety disorder Mother   . Arthritis Mother     rheumatoid  . Cancer Father     possible prostate cancer  . Diabetes Sister     Allergies  Allergen Reactions  . Sulfa Antibiotics Other (See Comments)    Blood pressure drops.  . Penicillins Rash    Objective: Temp: 98.3 F (36.8 C) (09/01 0953) Temp src: Oral (09/01  0076) BP: 152/91 mmHg (09/01 1000) Pulse Rate: 77 (09/01 1000) Body mass index is 31.7 kg/(m^2).  General: She is in good spirits. Her weight is up. Oral: No oropharyngeal lesions Skin: No rash Lungs: Clear Cor: Regular S1 and S2 with no murmurs  Lab Results Lab Results  Component Value Date   WBC 3.8* 10/11/2013   HGB 11.9* 10/11/2013   HCT 35.8* 10/11/2013   MCV 86.9 10/11/2013   PLT 223 10/11/2013    Lab Results  Component Value Date   CREATININE 0.67 10/11/2013   BUN 11 10/11/2013   NA 140 10/11/2013   K 3.2* 10/11/2013   CL 102 10/11/2013   CO2 28 10/11/2013    Lab Results  Component Value Date   ALT 8 10/11/2013   AST 14 10/11/2013   ALKPHOS 62 10/11/2013   BILITOT 1.6* 10/11/2013    Lab Results   Component Value Date   CHOL 183 10/11/2013   HDL 45 10/11/2013   LDLCALC 116* 10/11/2013   TRIG 110 10/11/2013   CHOLHDL 4.1 10/11/2013    Lab Results HIV 1 RNA Quant (copies/mL)  Date Value  10/11/2013 1508*  01/26/2013 <20   08/24/2012 <20      HIV-1 RNA Viral Load (no units)  Date Value  09/15/2013 2647   09/15/2013 2647   06/15/2013 <40   04/20/2013 <40      CD4 (no units)  Date Value  09/15/2013 260   09/15/2013 260   06/15/2013 232   04/20/2013 312      CD4 T Cell Abs (/uL)  Date Value  10/11/2013 280*  08/24/2012 260*  02/13/2012 330*     Assessment: I'm worried about emerging resistance.  Plan: 1. Check HIV genotype and integrase resistance assays 2. Continue current antiretroviral regimen until next visit 3. Followup in 6 weeks   Michel Bickers, MD [facility] for [place] [phone] pager   [phone] cell 11/09/2013, 10:17 AM

## 2013-11-18 ENCOUNTER — Ambulatory Visit (INDEPENDENT_AMBULATORY_CARE_PROVIDER_SITE_OTHER): Payer: Self-pay | Admitting: *Deleted

## 2013-11-18 VITALS — BP 133/83 | HR 76 | Temp 98.3°F | Resp 16 | Wt 179.5 lb

## 2013-11-18 DIAGNOSIS — B20 Human immunodeficiency virus [HIV] disease: Secondary | ICD-10-CM

## 2013-11-18 DIAGNOSIS — Z006 Encounter for examination for normal comparison and control in clinical research program: Secondary | ICD-10-CM

## 2013-11-18 LAB — COMPREHENSIVE METABOLIC PANEL
ALT: 12 U/L (ref 0–35)
AST: 15 U/L (ref 0–37)
Albumin: 4 g/dL (ref 3.5–5.2)
Alkaline Phosphatase: 78 U/L (ref 39–117)
BUN: 10 mg/dL (ref 6–23)
CHLORIDE: 105 meq/L (ref 96–112)
CO2: 25 meq/L (ref 19–32)
Calcium: 9.2 mg/dL (ref 8.4–10.5)
Creat: 0.64 mg/dL (ref 0.50–1.10)
Glucose, Bld: 72 mg/dL (ref 70–99)
POTASSIUM: 3.8 meq/L (ref 3.5–5.3)
Sodium: 140 mEq/L (ref 135–145)
TOTAL PROTEIN: 7.6 g/dL (ref 6.0–8.3)
Total Bilirubin: 1.3 mg/dL — ABNORMAL HIGH (ref 0.2–1.2)

## 2013-11-18 LAB — HIV-1 INTEGRASE GENOTYPE

## 2013-11-18 NOTE — Addendum Note (Signed)
Addended by: Bobbie Stack on: 11/18/2013 12:15 PM   Modules accepted: Orders

## 2013-11-18 NOTE — Progress Notes (Signed)
[name] [name] is here for her 1 month Reprieve study visit. She reports some muscle spasms in her neck and legs  Since July but they are intermittent. No muscle weakness or pain noted. She says she missed 1 dose of her study med but pill count shows at least 7 were missed. She says she doesn't like to take medications and we discussed the importance of her particularly taking her ARVS every day. She has not switched to prezcobix yet, and wanted to finish up the meds she had. She will return in 3 months for the 4 month visit and the 72 week [place] study visits.

## 2013-11-23 ENCOUNTER — Ambulatory Visit: Payer: Medicare Other

## 2013-11-29 ENCOUNTER — Other Ambulatory Visit: Payer: Self-pay | Admitting: *Deleted

## 2013-11-29 DIAGNOSIS — B20 Human immunodeficiency virus [HIV] disease: Secondary | ICD-10-CM

## 2013-11-29 MED ORDER — TENOFOVIR DISOPROXIL FUMARATE 300 MG PO TABS: 300.0000 mg | ORAL_TABLET | Freq: Every day | ORAL | Status: DC

## 2013-11-29 MED ORDER — DARUNAVIR-COBICISTAT 800-150 MG PO TABS: 1.0000 | ORAL_TABLET | Freq: Every day | ORAL | Status: DC

## 2013-11-29 MED ORDER — ABACAVIR SULFATE 300 MG PO TABS: 600.0000 mg | ORAL_TABLET | Freq: Every day | ORAL | Status: DC

## 2013-11-29 NOTE — Telephone Encounter (Signed)
ADAP Application 

## 2013-12-07 ENCOUNTER — Other Ambulatory Visit: Payer: Self-pay | Admitting: Internal Medicine

## 2014-01-04 ENCOUNTER — Ambulatory Visit (INDEPENDENT_AMBULATORY_CARE_PROVIDER_SITE_OTHER): Payer: Medicare Other | Admitting: Internal Medicine

## 2014-01-04 VITALS — BP 167/95 | HR 86 | Temp 98.2°F | Wt 186.0 lb

## 2014-01-04 DIAGNOSIS — B20 Human immunodeficiency virus [HIV] disease: Secondary | ICD-10-CM

## 2014-01-04 NOTE — Addendum Note (Signed)
Addended byMarlis Edelson on: 01/04/2014 12:13 PM   Modules accepted: Orders

## 2014-01-04 NOTE — Progress Notes (Signed)
Patient ID: [name] [name], female   DOB: [birth year], 58 y.o.   MRN: [id number]          Patient Active Problem List   Diagnosis Date Noted  . HIV INFECTION 08/12/2006    Priority: High  . Dyslipidemia 03/30/2013    Priority: Medium  . HYPERTENSION 03/29/2009    Priority: Medium  . Acid reflux 05/18/2013  . Rectal bleeding 06/26/2010  . LOW BACK PAIN, ACUTE 01/16/2010  . LESION, VAGINA 10/05/2009  . PAPANICOLAOU SMEAR OF VAGINA WITH LGSIL 01/25/2009  . VIRAL MENINGITIS 09/14/2008  . DEPRESSION 03/22/2008  . DENTAL CARIES 09/21/2007  . GROSS HEMATURIA 08/26/2007  . DEGENERATIVE JOINT DISEASE 08/12/2006  . GENITAL HERPES 01/13/2006  . ANEMIA 01/13/2006  . DEPENDENCE, COCAINE, UNSPECIFIED 01/13/2006  . ALLERGIC RHINITIS 01/13/2006  . GERD 01/13/2006  . PREGNANCY, ECTOPIC NOS W/O INTRAUTERINE PRG 01/13/2006  . ABORTION NOS W/PELVIC DAMAGE, UNSPECIFIED 01/13/2006  . ANGIOEDEMA 01/13/2006  . BUNIONECTOMY, HX OF 01/13/2006    Patient's Medications  New Prescriptions   No medications on file  Previous Medications   ABACAVIR (ZIAGEN) 300 MG TABLET    Take 2 tablets (600 mg total) by mouth at bedtime.   DARUNAVIR-COBICISTAT (PREZCOBIX) 800-150 MG PER TABLET    Take 1 tablet by mouth daily. Swallow whole. Do NOT crush, break or chew tablets. Take with food.   DIAZEPAM (VALIUM) 2 MG TABLET    Take 1 tablet (2 mg total) by mouth every 12 (twelve) hours as needed for muscle spasms.   FAMOTIDINE (PEPCID) 40 MG TABLET    Take 1 tablet (40 mg total) by mouth daily.   LISINOPRIL (PRINIVIL,ZESTRIL) 10 MG TABLET    Take 10 mg by mouth daily.   PITAVASTATIN CALCIUM 4 MG TABS    Take 1 tablet (4 mg total) by mouth daily.   TENOFOVIR (VIREAD) 300 MG TABLET    Take 1 tablet (300 mg total) by mouth at bedtime.   VALACYCLOVIR (VALTREX) 500 MG TABLET    Take 500 mg by mouth 3 (three) times daily as needed (outbreak).  Modified Medications   No medications on file  Discontinued Medications   ABACAVIR (ZIAGEN) 300 MG TABLET    TAKE 1 TABLET BY MOUTH TWICE DAILY    Subjective: [name] [name] is in for her routine visit. She has missed only one dose of her HIV meds since her last visit when she was away from home at church. She takes her HIV meds at 8 pm and her famotidine in the morning. A "friend" disclosed her HIV status to some neighbors and she is worried about what might happen to her.  Review of Systems: Pertinent items are noted in HPI.  Past Medical History  Diagnosis Date  . HIV (human immunodeficiency virus infection) 2007  . Hypertension   . GERD (gastroesophageal reflux disease)     History  Substance Use Topics  . Smoking status: Never Smoker   . Smokeless tobacco: Never Used  . Alcohol Use: No    Family History  Problem Relation Age of Onset  . Coronary artery disease Mother   . Hypertension Mother   . Anxiety disorder Mother   . Arthritis Mother     rheumatoid  . Cancer Father     possible prostate cancer  . Diabetes Sister     Allergies  Allergen Reactions  . Sulfa Antibiotics Other (See Comments)    Blood pressure drops.  . Penicillins Rash    Objective: Temp: 98.2 F (  36.8 C) (10/27 1037) Temp Source: Oral (10/27 1037) BP: 167/95 mmHg (10/27 1037) Pulse Rate: 86 (10/27 1037) Body mass index is 32.67 kg/(m^2).  General: no distress initially but became tearful and admits to poor sleep and recent depression Oral: denture; no lesions Skin: no rash Lungs: clear Cor: reg S1 and S2 without murmurs  Lab Results Lab Results  Component Value Date   WBC 3.8* 10/11/2013   HGB 11.9* 10/11/2013   HCT 35.8* 10/11/2013   MCV 86.9 10/11/2013   PLT 223 10/11/2013    Lab Results  Component Value Date   CREATININE 0.64 11/18/2013   BUN 10 11/18/2013   NA 140 11/18/2013   K 3.8 11/18/2013   CL 105 11/18/2013   CO2 25 11/18/2013    Lab Results  Component Value Date   ALT 12 11/18/2013   AST 15 11/18/2013   ALKPHOS 78 11/18/2013   BILITOT 1.3* 11/18/2013     Lab Results  Component Value Date   CHOL 183 10/11/2013   HDL 45 10/11/2013   LDLCALC 116* 10/11/2013   TRIG 110 10/11/2013   CHOLHDL 4.1 10/11/2013    Lab Results HIV 1 RNA Quant (copies/mL)  Date Value  10/11/2013 1508*  01/26/2013 <20   08/24/2012 <20      HIV-1 RNA Viral Load (no units)  Date Value  09/15/2013 2647   09/15/2013 2647   06/15/2013 <40   04/20/2013 <40      CD4 (no units)  Date Value  09/15/2013 260   09/15/2013 260   06/15/2013 232   04/20/2013 312      CD4 T Cell Abs (/uL)  Date Value  10/11/2013 280*  08/24/2012 260*  02/13/2012 330*     Assessment: 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 I suspect her adherence has been spotty (perhaps improved recently). Additionally she may be having some adverse drug interactions affecting efficacy of her regimen. She is depressed and having memory and concentration issues that impact her adherence.  Plan: 1. Continue Ziagen, Viread, Reyataz and Norvir for now 2. Check viral load 3. Pillbox 4. [facility] counseling referral 5. F/U in 2 weeks   Michel Bickers, MD [facility] for [place] [phone] pager   [phone] cell 01/04/2014, 11:00 AM

## 2014-01-05 ENCOUNTER — Other Ambulatory Visit: Payer: Self-pay | Admitting: Licensed Clinical Social Worker

## 2014-01-05 ENCOUNTER — Other Ambulatory Visit: Payer: Self-pay | Admitting: Internal Medicine

## 2014-01-05 DIAGNOSIS — B20 Human immunodeficiency virus [HIV] disease: Secondary | ICD-10-CM

## 2014-01-05 LAB — HIV-1 RNA QUANT-NO REFLEX-BLD
HIV 1 RNA Quant: 39 copies/mL — ABNORMAL HIGH (ref <20)
HIV-1 RNA QUANT, LOG: 1.59 {Log} — AB (ref <1.30)

## 2014-01-05 MED ORDER — ABACAVIR SULFATE 300 MG PO TABS: 600.0000 mg | ORAL_TABLET | Freq: Every day | ORAL | Status: DC

## 2014-01-05 MED ORDER — RITONAVIR 100 MG PO TABS: 100.0000 mg | ORAL_TABLET | Freq: Every day | ORAL | Status: DC

## 2014-01-05 MED ORDER — TENOFOVIR DISOPROXIL FUMARATE 300 MG PO TABS: 300.0000 mg | ORAL_TABLET | Freq: Every day | ORAL | Status: DC

## 2014-01-05 MED ORDER — ATAZANAVIR SULFATE 300 MG PO CAPS: 300.0000 mg | ORAL_CAPSULE | Freq: Every day | ORAL | Status: DC

## 2014-01-06 ENCOUNTER — Ambulatory Visit: Payer: Medicare Other

## 2014-01-13 ENCOUNTER — Ambulatory Visit (INDEPENDENT_AMBULATORY_CARE_PROVIDER_SITE_OTHER): Payer: Self-pay | Admitting: *Deleted

## 2014-01-13 VITALS — BP 131/78 | HR 90 | Temp 98.2°F | Ht 61.0 in | Wt 181.8 lb

## 2014-01-13 DIAGNOSIS — Z131 Encounter for screening for diabetes mellitus: Secondary | ICD-10-CM

## 2014-01-13 DIAGNOSIS — E785 Hyperlipidemia, unspecified: Secondary | ICD-10-CM

## 2014-01-13 DIAGNOSIS — Z13220 Encounter for screening for lipoid disorders: Secondary | ICD-10-CM

## 2014-01-13 DIAGNOSIS — Z006 Encounter for examination for normal comparison and control in clinical research program: Secondary | ICD-10-CM

## 2014-01-13 NOTE — Progress Notes (Signed)
[name] [name] is here for her week 2 [place] study visit. She continues to c/o left foot numbness and pain and bilateral knee weakness. She says she still notices some trouble with short term memory problems and trouble focusing. She will return in [facility] for the 4 month Reprieve visit and again in April for theis study.

## 2014-01-14 LAB — CBC WITH DIFFERENTIAL/PLATELET
BASOS ABS: 0 10*3/uL (ref 0.0–0.1)
Basophils Relative: 0 % (ref 0–1)
Eosinophils Absolute: 0.1 10*3/uL (ref 0.0–0.7)
Eosinophils Relative: 3 % (ref 0–5)
HEMATOCRIT: 38.2 % (ref 36.0–46.0)
Hemoglobin: 12.8 g/dL (ref 12.0–15.0)
LYMPHS PCT: 30 % (ref 12–46)
Lymphs Abs: 1.1 10*3/uL (ref 0.7–4.0)
MCH: 29.8 pg (ref 26.0–34.0)
MCHC: 33.5 g/dL (ref 30.0–36.0)
MCV: 89 fL (ref 78.0–100.0)
MONO ABS: 0.3 10*3/uL (ref 0.1–1.0)
Monocytes Relative: 9 % (ref 3–12)
NEUTROS ABS: 2.1 10*3/uL (ref 1.7–7.7)
NEUTROS PCT: 58 % (ref 43–77)
Platelets: 243 10*3/uL (ref 150–400)
RBC: 4.29 MIL/uL (ref 3.87–5.11)
RDW: 14.2 % (ref 11.5–15.5)
WBC: 3.6 10*3/uL — AB (ref 4.0–10.5)

## 2014-01-14 LAB — CREATININE, URINE, RANDOM: Creatinine, Urine: 434 mg/dL

## 2014-01-14 LAB — COMPREHENSIVE METABOLIC PANEL
ALT: <8 U/L (ref 0–35)
AST: 13 U/L (ref 0–37)
Albumin: 4.3 g/dL (ref 3.5–5.2)
Alkaline Phosphatase: 88 U/L (ref 39–117)
BUN: 9 mg/dL (ref 6–23)
CHLORIDE: 102 meq/L (ref 96–112)
CO2: 25 meq/L (ref 19–32)
CREATININE: 0.71 mg/dL (ref 0.50–1.10)
Calcium: 9.2 mg/dL (ref 8.4–10.5)
Glucose, Bld: 88 mg/dL (ref 70–99)
POTASSIUM: 3.8 meq/L (ref 3.5–5.3)
Sodium: 138 mEq/L (ref 135–145)
Total Bilirubin: 2.1 mg/dL — ABNORMAL HIGH (ref 0.2–1.2)
Total Protein: 7.9 g/dL (ref 6.0–8.3)

## 2014-01-14 LAB — LIPID PANEL
CHOL/HDL RATIO: 2.8 ratio
Cholesterol: 173 mg/dL (ref 0–200)
HDL: 61 mg/dL (ref >39)
LDL CALC: 94 mg/dL (ref 0–99)
TRIGLYCERIDES: 91 mg/dL (ref <150)
VLDL: 18 mg/dL (ref 0–40)

## 2014-01-14 LAB — HEPATITIS C ANTIBODY: HCV Ab: NEGATIVE

## 2014-01-14 LAB — HEMOGLOBIN A1C
Hgb A1c MFr Bld: 5.6 % (ref <5.7)
Mean Plasma Glucose: 114 mg/dL (ref <117)

## 2014-01-14 LAB — PROTEIN, URINE, RANDOM: Total Protein, Urine: 25 mg/dL — ABNORMAL HIGH (ref 5–24)

## 2014-01-18 ENCOUNTER — Ambulatory Visit (INDEPENDENT_AMBULATORY_CARE_PROVIDER_SITE_OTHER): Payer: Self-pay | Admitting: Internal Medicine

## 2014-01-18 ENCOUNTER — Encounter: Payer: Self-pay | Admitting: Internal Medicine

## 2014-01-18 VITALS — BP 132/85 | HR 92 | Temp 98.1°F | Wt 183.5 lb

## 2014-01-18 DIAGNOSIS — M79672 Pain in left foot: Secondary | ICD-10-CM

## 2014-01-18 DIAGNOSIS — Z23 Encounter for immunization: Secondary | ICD-10-CM

## 2014-01-18 DIAGNOSIS — Z79899 Other long term (current) drug therapy: Secondary | ICD-10-CM

## 2014-01-18 DIAGNOSIS — B20 Human immunodeficiency virus [HIV] disease: Secondary | ICD-10-CM

## 2014-01-18 NOTE — Progress Notes (Signed)
Patient ID: [name] [name], female   DOB: 09/28/1955, 58 y.o.   MRN: [id number]          Patient Active Problem List   Diagnosis Date Noted  . Human immunodeficiency virus (HIV) disease 08/12/2006    Priority: High  . Dyslipidemia 03/30/2013    Priority: Medium  . HYPERTENSION 03/29/2009    Priority: Medium  . Left foot pain 01/18/2014  . Acid reflux 05/18/2013  . Rectal bleeding 06/26/2010  . LOW BACK PAIN, ACUTE 01/16/2010  . LESION, VAGINA 10/05/2009  . PAPANICOLAOU SMEAR OF VAGINA WITH LGSIL 01/25/2009  . VIRAL MENINGITIS 09/14/2008  . DEPRESSION 03/22/2008  . DENTAL CARIES 09/21/2007  . GROSS HEMATURIA 08/26/2007  . DEGENERATIVE JOINT DISEASE 08/12/2006  . GENITAL HERPES 01/13/2006  . ANEMIA 01/13/2006  . DEPENDENCE, COCAINE, UNSPECIFIED 01/13/2006  . ALLERGIC RHINITIS 01/13/2006  . GERD 01/13/2006  . PREGNANCY, ECTOPIC NOS W/O INTRAUTERINE PRG 01/13/2006  . ABORTION NOS W/PELVIC DAMAGE, UNSPECIFIED 01/13/2006  . ANGIOEDEMA 01/13/2006  . BUNIONECTOMY, HX OF 01/13/2006    Patient's Medications  New Prescriptions   No medications on file  Previous Medications   ABACAVIR (ZIAGEN) 300 MG TABLET    Take 2 tablets (600 mg total) by mouth at bedtime.   ATAZANAVIR (REYATAZ) 300 MG CAPSULE    Take 1 capsule (300 mg total) by mouth at bedtime.   DIAZEPAM (VALIUM) 2 MG TABLET    Take 1 tablet (2 mg total) by mouth every 12 (twelve) hours as needed for muscle spasms.   FAMOTIDINE (PEPCID) 40 MG TABLET    Take 1 tablet (40 mg total) by mouth daily.   LISINOPRIL (PRINIVIL,ZESTRIL) 10 MG TABLET    Take 10 mg by mouth daily.   PITAVASTATIN CALCIUM 4 MG TABS    Take 1 tablet (4 mg total) by mouth daily.   RITONAVIR (NORVIR) 100 MG TABS TABLET    Take 1 tablet (100 mg total) by mouth at bedtime.   TENOFOVIR (VIREAD) 300 MG TABLET    Take 1 tablet (300 mg total) by mouth at bedtime.   VALACYCLOVIR (VALTREX) 500 MG TABLET    Take 500 mg by mouth 3 (three) times daily as needed  (outbreak).  Modified Medications   No medications on file  Discontinued Medications   No medications on file    Subjective: Camryn is in for her routine visit. She is feeling better. She has been doing better taking her medications and does not feel like she has missed a dose since her last visit. She is feeling less depressed. She is having some pain in her left foot it bothers her most after she has been standing and walking for some time. She has not had any recent injury to her foot. Review of Systems: Pertinent items are noted in HPI.  Past Medical History  Diagnosis Date  . HIV (human immunodeficiency virus infection) 2007  . Hypertension   . GERD (gastroesophageal reflux disease)     History  Substance Use Topics  . Smoking status: Never Smoker   . Smokeless tobacco: Never Used  . Alcohol Use: No    Family History  Problem Relation Age of Onset  . Coronary artery disease Mother   . Hypertension Mother   . Anxiety disorder Mother   . Arthritis Mother     rheumatoid  . Cancer Father     possible prostate cancer  . Diabetes Sister     Allergies  Allergen Reactions  . Sulfa Antibiotics Other (  See Comments)    Blood pressure drops.  . Penicillins Rash    Objective: Temp: 98.1 F (36.7 C) (11/10 1127) Temp Source: Oral (11/10 1127) BP: 132/85 mmHg (11/10 1127) Pulse Rate: 92 (11/10 1127) Body mass index is 34.69 kg/(m^2).  General: she is in good spirits Oral: no oropharyngeal lesions Skin: no rash Lungs: clear Cor: regular S1 and S2 with no murmurs Joints and extremities: she has good pulses and normal sensation in her feet. There is no obvious swelling or inflammation in her left foot Mood and affect: Bright and appropriate  Lab Results Lab Results  Component Value Date   WBC 3.6* 01/13/2014   HGB 12.8 01/13/2014   HCT 38.2 01/13/2014   MCV 89.0 01/13/2014   PLT 243 01/13/2014    Lab Results  Component Value Date   CREATININE 0.71  01/13/2014   BUN 9 01/13/2014   NA 138 01/13/2014   K 3.8 01/13/2014   CL 102 01/13/2014   CO2 25 01/13/2014    Lab Results  Component Value Date   ALT <8 01/13/2014   AST 13 01/13/2014   ALKPHOS 88 01/13/2014   BILITOT 2.1* 01/13/2014    Lab Results  Component Value Date   CHOL 173 01/13/2014   HDL 61 01/13/2014   LDLCALC 94 01/13/2014   TRIG 91 01/13/2014   CHOLHDL 2.8 01/13/2014    Lab Results HIV 1 RNA QUANT (copies/mL)  Date Value  01/04/2014 39*  10/11/2013 1508*  01/26/2013 <20   HIV-1 RNA VIRAL LOAD (no units)  Date Value  09/15/2013 2647  09/15/2013 2647  06/15/2013 <40   CD4 (no units)  Date Value  09/15/2013 260  09/15/2013 260  06/15/2013 232   CD4 T CELL ABS  Date Value  10/11/2013 280 /uL*  08/24/2012 260 cmm*  02/13/2012 330 cmm*     Assessment: Her HIV infection has come back under better control as her adherence has improved. She is scheduled to meet with our mental health counselor.  Plan: 1. Continue current antiretroviral regimen (I will not switch to Prezcobix because she has a history of sulfa allergy) 2. Mental health counseling 3. Try extra strength Tylenol as needed for her left foot pain   Michel Bickers, MD [facility] for [place] [phone] pager   [phone] cell 01/18/2014, 1:28 PM

## 2014-02-17 ENCOUNTER — Ambulatory Visit (INDEPENDENT_AMBULATORY_CARE_PROVIDER_SITE_OTHER): Payer: Self-pay | Admitting: *Deleted

## 2014-02-17 VITALS — Wt 187.5 lb

## 2014-02-17 DIAGNOSIS — B20 Human immunodeficiency virus [HIV] disease: Secondary | ICD-10-CM

## 2014-02-17 DIAGNOSIS — Z006 Encounter for examination for normal comparison and control in clinical research program: Secondary | ICD-10-CM

## 2014-02-17 NOTE — Progress Notes (Signed)
[name] [name] is here for her 4 month Reprieve study followup. She denies any new problems currently and says she has been taking her study med regularly, but pill count was about 10 pills off over 4 months. She will return in April for the A5322 study visit and the Reprieve study visit.

## 2014-04-08 ENCOUNTER — Other Ambulatory Visit: Payer: Self-pay | Admitting: *Deleted

## 2014-04-08 DIAGNOSIS — B20 Human immunodeficiency virus [HIV] disease: Secondary | ICD-10-CM

## 2014-04-08 MED ORDER — RITONAVIR 100 MG PO TABS: 100.0000 mg | ORAL_TABLET | Freq: Every day | ORAL | Status: DC

## 2014-04-08 MED ORDER — ATAZANAVIR SULFATE 300 MG PO CAPS: 300.0000 mg | ORAL_CAPSULE | Freq: Every day | ORAL | Status: DC

## 2014-04-08 MED ORDER — ABACAVIR SULFATE 300 MG PO TABS: 600.0000 mg | ORAL_TABLET | Freq: Every day | ORAL | Status: DC

## 2014-04-08 MED ORDER — TENOFOVIR DISOPROXIL FUMARATE 300 MG PO TABS: 300.0000 mg | ORAL_TABLET | Freq: Every day | ORAL | Status: DC

## 2014-06-21 ENCOUNTER — Ambulatory Visit: Payer: Self-pay | Admitting: *Deleted

## 2014-06-21 VITALS — BP 116/73 | HR 80 | Temp 98.6°F | Resp 16 | Wt 181.5 lb

## 2014-06-21 DIAGNOSIS — Z006 Encounter for examination for normal comparison and control in clinical research program: Secondary | ICD-10-CM

## 2014-06-21 NOTE — Progress Notes (Signed)
Fraser Din is here for her 8 month reprieve study visit and her week 120 A5322 study visit. She denies any problems except for having a vision problem one day she woke up last month and said she felt like she had a veil over her eyes that lasted appr. 5 min. Then her vision cleared. She said that it has happenned once before. She has been walking regularly and has lost some weight and wants to continue working out. She forgot to bring her pill bottles back for Reprieve, but says she still has a few pills left in her original bottle. She says she sometimes misses doses because she forgets to take them with her when she goes out somewhere. We talked about adherence and making sure she keeps them with her. She should have finished that bottle of pills 2 months ago (6 months worth) and started on the next bottle.  She is to return in 68months for Reprieve and 6 months for A5322 study.

## 2014-06-24 ENCOUNTER — Other Ambulatory Visit: Payer: Self-pay | Admitting: Internal Medicine

## 2014-07-12 IMAGING — CT CT ORBITS W/O CM
3 of 4 series · 16 of 47 positions shown, 19 images · non-contrast
Comparison: None.

CLINICAL DATA: Facial trauma.  Left orbital trauma.

CT ORBITS WITHOUT CONTRAST
TECHNIQUE: Multidetector CT imaging of the orbits was performed
following the standard protocol without intravenous contrast.

[Series 3: orbit/facial 2.0 h30s · axial · 0.29mm/px · z∈[-139,-57]mm · 10 of 49 slices shown, 13 images]
[im 4/49  brain]
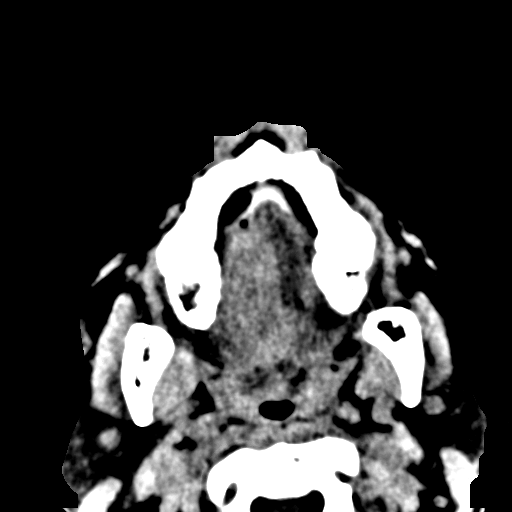
[im 4/49  bone]
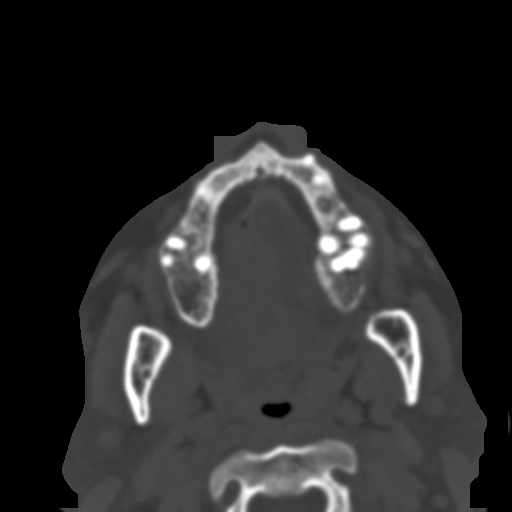
[im 9/49  bone]
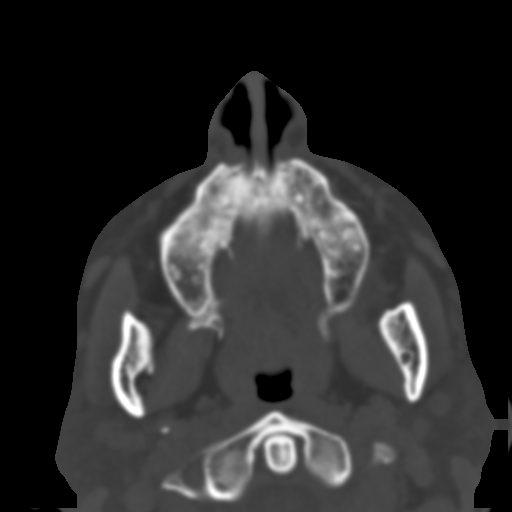
[im 14/49  bone]
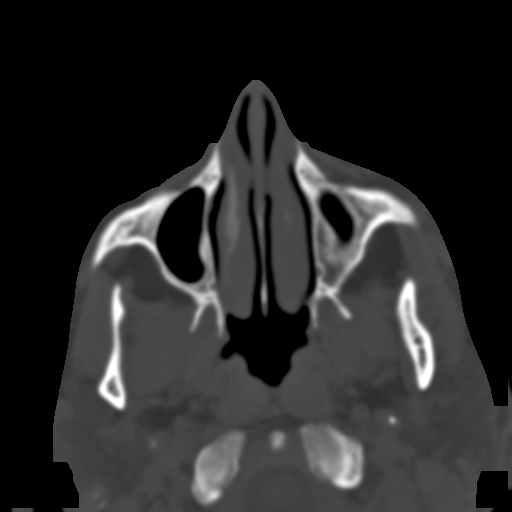
[im 17/49  bone]
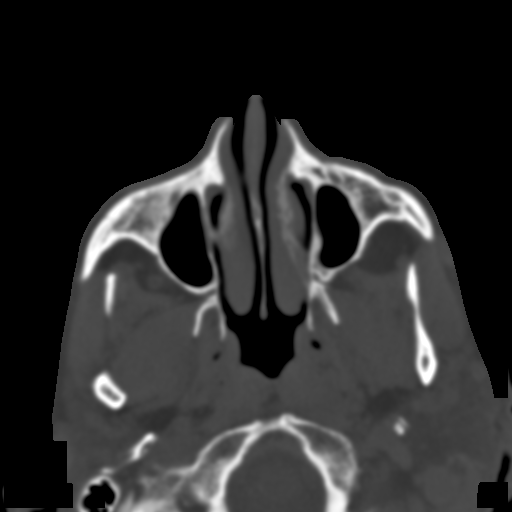
[im 22/49  brain]
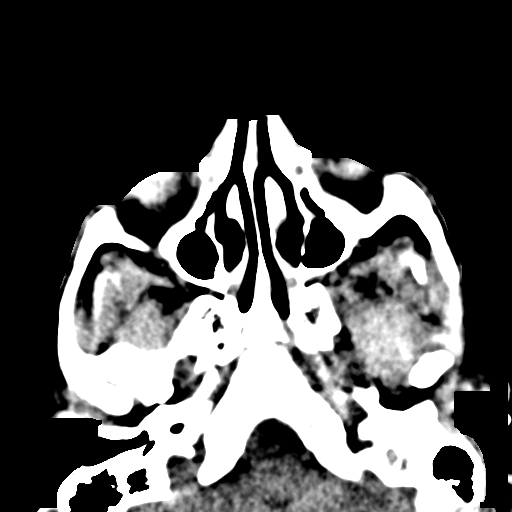
[im 22/49  bone]
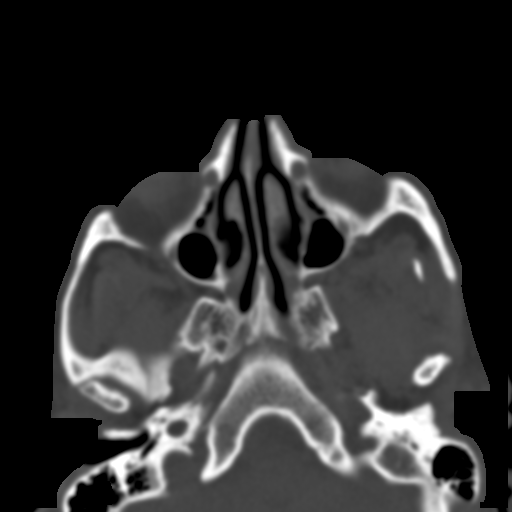
[im 27/49  bone]
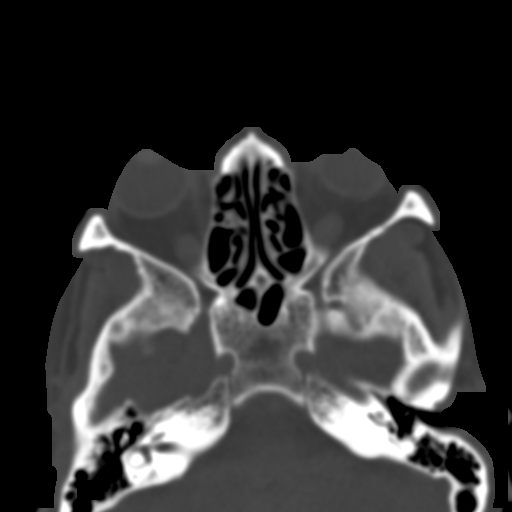
[im 32/49  bone]
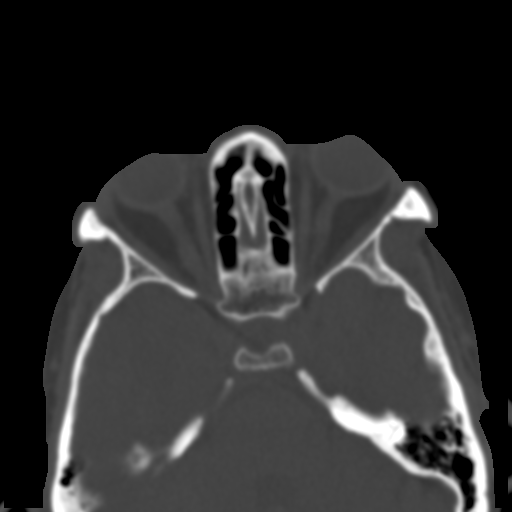
[im 37/49  bone]
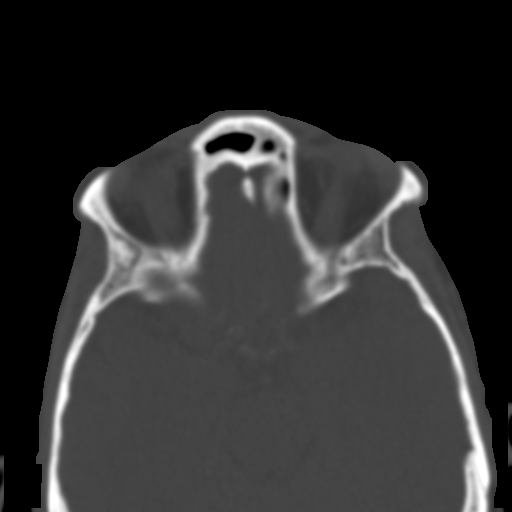
[im 40/49  brain]
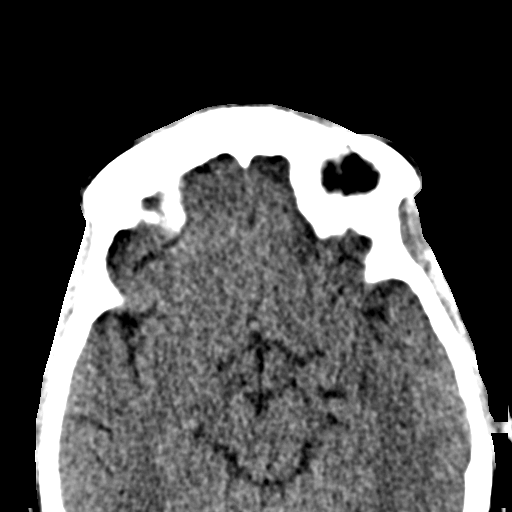
[im 40/49  bone]
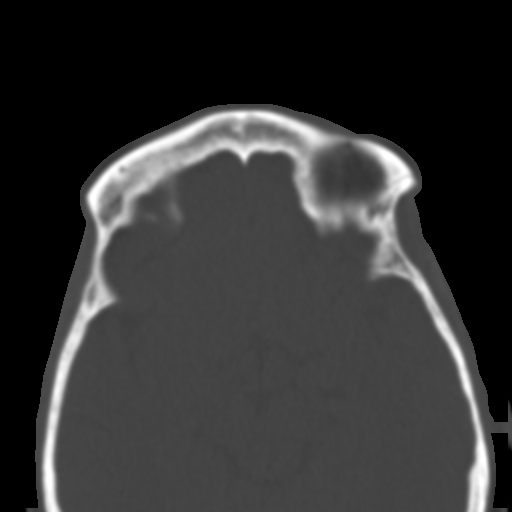
[im 45/49  bone]
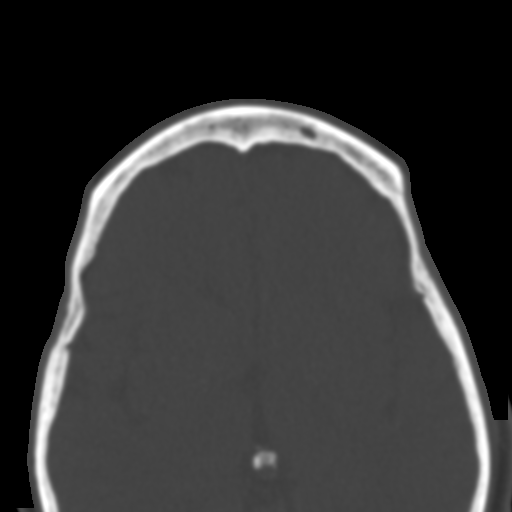

[Series 7: coronals · coronal · 0.30mm/px · 3 of 60 slices shown]
[im 15/60  bone]
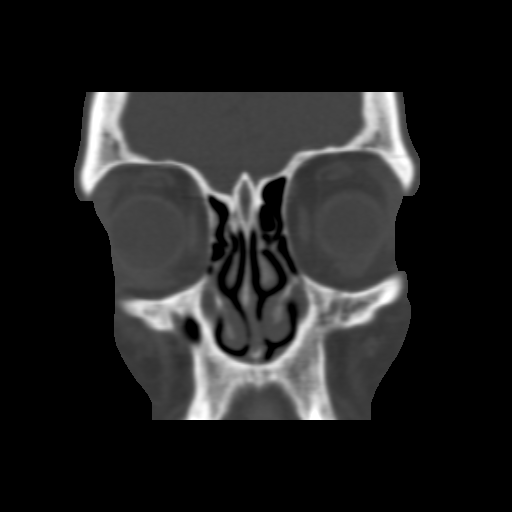
[im 30/60  bone]
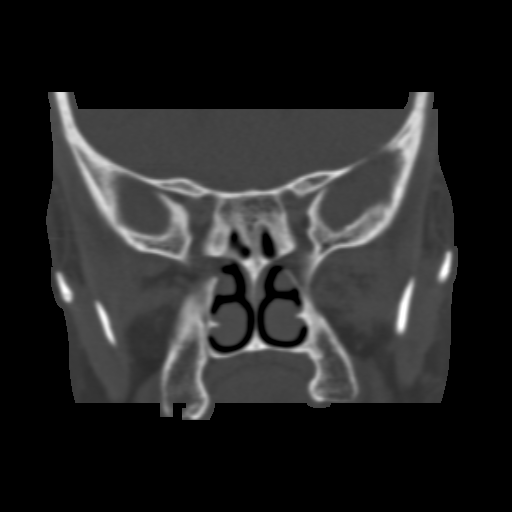
[im 45/60  bone]
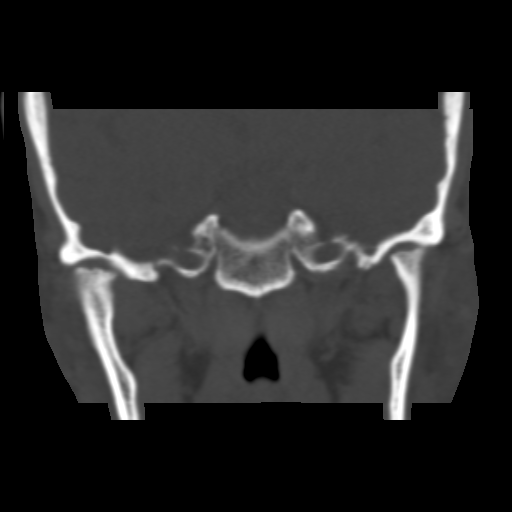

[Series 8: sagittals · sagittal · 0.27mm/px · 3 of 73 slices shown]
[im 25/73  bone]
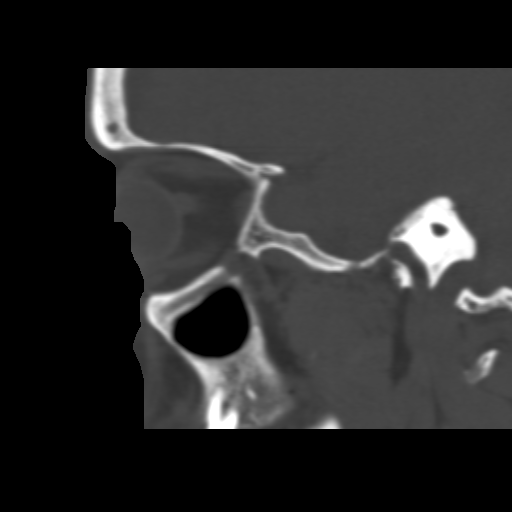
[im 37/73  bone]
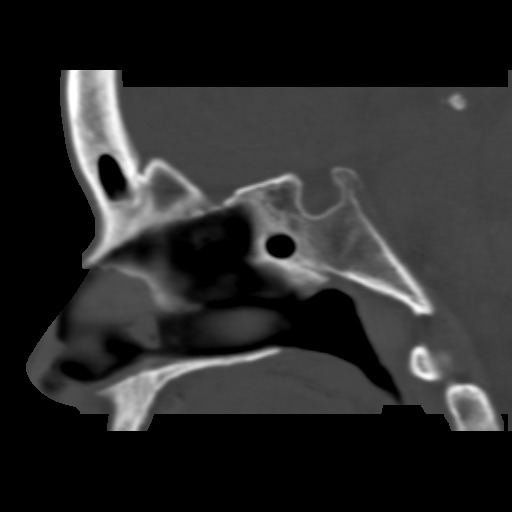
[im 49/73  bone]
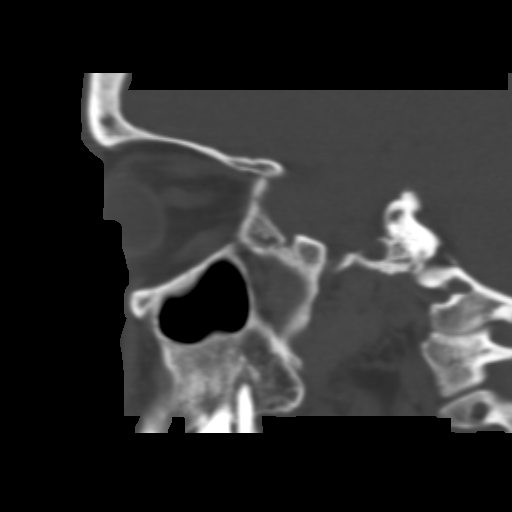

[16 of 47 positions shown; findings below may reference images not displayed]

FINDINGS: The visualized intracranial contents are within normal
limits.  The globes appear intact.  The both orbits are normal.
Maxillary sinuses and ethmoid air cells are clear.  Frontal sinuses
and sphenoid sinuses appear normal.  Pterygoid plates are intact.
Zygomatic arches intact.  Nasal bones are normal.  Both mandibular
condyles appear located.  Bilateral severe temporomandibular joint
osteoarthritis is present.

Soft tissue windows demonstrate normal appearance of the optic
nerves by CT.  No orbital hematoma.
IMPRESSION: No orbital fracture or acute abnormality.

## 2014-07-14 ENCOUNTER — Encounter: Payer: Self-pay | Admitting: Internal Medicine

## 2014-07-14 LAB — HIV-1 RNA QUANT-NO REFLEX-BLD

## 2014-07-23 ENCOUNTER — Other Ambulatory Visit: Payer: Self-pay | Admitting: Internal Medicine

## 2014-07-23 DIAGNOSIS — B20 Human immunodeficiency virus [HIV] disease: Secondary | ICD-10-CM

## 2014-08-23 ENCOUNTER — Encounter: Payer: Self-pay | Admitting: Internal Medicine

## 2014-08-23 ENCOUNTER — Ambulatory Visit (INDEPENDENT_AMBULATORY_CARE_PROVIDER_SITE_OTHER): Payer: Self-pay | Admitting: Internal Medicine

## 2014-08-23 ENCOUNTER — Other Ambulatory Visit: Payer: Self-pay | Admitting: Pharmacist Clinician (PhC)/ Clinical Pharmacy Specialist

## 2014-08-23 DIAGNOSIS — B20 Human immunodeficiency virus [HIV] disease: Secondary | ICD-10-CM

## 2014-08-23 MED ORDER — TENOFOVIR DISOPROXIL FUMARATE 300 MG PO TABS: 300.0000 mg | ORAL_TABLET | Freq: Every day | ORAL | Status: DC

## 2014-08-23 MED ORDER — ABACAVIR-DOLUTEGRAVIR-LAMIVUD 600-50-300 MG PO TABS: 1.0000 | ORAL_TABLET | Freq: Every day | ORAL | Status: DC

## 2014-08-23 MED ORDER — EMTRICITABINE-TENOFOVIR DF 200-300 MG PO TABS: 1.0000 | ORAL_TABLET | Freq: Every day | ORAL | Status: DC

## 2014-08-23 NOTE — Progress Notes (Addendum)
Patient ID: [name] [name], female   DOB: Jan 26, 1956, 59 y.o.   MRN: [id number]          Patient Active Problem List   Diagnosis Date Noted  . Human immunodeficiency virus (HIV) disease 08/12/2006    Priority: High  . Dyslipidemia 03/30/2013    Priority: Medium  . HYPERTENSION 03/29/2009    Priority: Medium  . Left foot pain 01/18/2014  . Acid reflux 05/18/2013  . Rectal bleeding 06/26/2010  . LOW BACK PAIN, ACUTE 01/16/2010  . LESION, VAGINA 10/05/2009  . PAPANICOLAOU SMEAR OF VAGINA WITH LGSIL 01/25/2009  . VIRAL MENINGITIS 09/14/2008  . DEPRESSION 03/22/2008  . DENTAL CARIES 09/21/2007  . GROSS HEMATURIA 08/26/2007  . DEGENERATIVE JOINT DISEASE 08/12/2006  . GENITAL HERPES 01/13/2006  . ANEMIA 01/13/2006  . DEPENDENCE, COCAINE, UNSPECIFIED 01/13/2006  . ALLERGIC RHINITIS 01/13/2006  . GERD 01/13/2006  . PREGNANCY, ECTOPIC NOS W/O INTRAUTERINE PRG 01/13/2006  . ABORTION NOS W/PELVIC DAMAGE, UNSPECIFIED 01/13/2006  . ANGIOEDEMA 01/13/2006  . BUNIONECTOMY, HX OF 01/13/2006    Patient's Medications  New Prescriptions   No medications on file  Previous Medications   ABACAVIR (ZIAGEN) 300 MG TABLET    TAKE 2 TABLETS BY MOUTH EVERY NIGHT AT BEDTIME   DIAZEPAM (VALIUM) 2 MG TABLET    Take 1 tablet (2 mg total) by mouth every 12 (twelve) hours as needed for muscle spasms.   FAMOTIDINE (PEPCID) 40 MG TABLET    TAKE 1 TABLET BY MOUTH EVERY DAY   LISINOPRIL (PRINIVIL,ZESTRIL) 10 MG TABLET    TAKE 1 TABLET BY MOUTH EVERY DAY   NORVIR 100 MG TABS TABLET    TAKE 1 TABLET BY MOUTH EVERY NIGHT AT BEDTIME   PITAVASTATIN CALCIUM 4 MG TABS    Take 1 tablet (4 mg total) by mouth daily.   REYATAZ 300 MG CAPSULE    TAKE ONE CAPSULE BY MOUTH EVERY NIGHT AT BEDTIME   VALACYCLOVIR (VALTREX) 500 MG TABLET    TAKE 1 TABLET BY MOUTH THREE TIMES DAILY   VIREAD 300 MG TABLET    TAKE 1 TABLET BY MOUTH EVERY NIGHT AT BEDTIME  Modified Medications   No medications on file  Discontinued  Medications   No medications on file    Subjective: Yailen is in for her routine HIV follow-up visit. She states that she has been doing better taking her HIV medications. She takes it each evening to between 8 and 9 PM. She estimates that she is only missed 3 doses since her last visit. She's been bothered by some burning pain on the bottom of her right foot. It is there every day. She takes Tylenol with some improvement. She's also had 3 episodes where her vision suddenly became blurry. Each episode lasted about 5 minutes and resolve spontaneously. She wears prescription reading glasses. She's not seen her eye doctor in many years. She recently turned 59 years old. She tells me that she wants to live a long time and be healthy. She started a walking regimen recently. Review of Systems: Constitutional: negative Eyes: as noted in history of present illness Ears, nose, mouth, throat, and face: negative Respiratory: negative Cardiovascular: negative Gastrointestinal: negative Genitourinary:negative  Past Medical History  Diagnosis Date  . HIV (human immunodeficiency virus infection) 2007  . Hypertension   . GERD (gastroesophageal reflux disease)     History  Substance Use Topics  . Smoking status: Never Smoker   . Smokeless tobacco: Never Used  . Alcohol Use: No  Family History  Problem Relation Age of Onset  . Coronary artery disease Mother   . Hypertension Mother   . Anxiety disorder Mother   . Arthritis Mother     rheumatoid  . Cancer Father     possible prostate cancer  . Diabetes Sister     Allergies  Allergen Reactions  . Sulfa Antibiotics Other (See Comments)    Blood pressure drops.  . Penicillins Rash    Objective: Temp: 98.5 F (36.9 C) (06/14 0926) Temp Source: Oral (06/14 0926) BP: 156/68 mmHg (06/14 0926) Pulse Rate: 71 (06/14 0926) Body mass index is 35.07 kg/(m^2).  General: Her weight remains stable at 185 pounds Oral: No oropharyngeal  lesions Skin: No rash Eyes: Normal external exam. Pupils equal and reactive to light. Normal extraocular movements Lungs: Clear Cor: Regular S1 and S2 no murmurs Mood: She is tearful during the exam when talking about her recent birthday and her desire to remain healthy  Lab Results Lab Results  Component Value Date   WBC 3.6* 01/13/2014   HGB 12.8 01/13/2014   HCT 38.2 01/13/2014   MCV 89.0 01/13/2014   PLT 243 01/13/2014    Lab Results  Component Value Date   CREATININE 0.71 01/13/2014   BUN 9 01/13/2014   NA 138 01/13/2014   K 3.8 01/13/2014   CL 102 01/13/2014   CO2 25 01/13/2014    Lab Results  Component Value Date   ALT <8 01/13/2014   AST 13 01/13/2014   ALKPHOS 88 01/13/2014   BILITOT 2.1* 01/13/2014    Lab Results  Component Value Date   CHOL 173 01/13/2014   HDL 61 01/13/2014   LDLCALC 94 01/13/2014   TRIG 91 01/13/2014   CHOLHDL 2.8 01/13/2014    Lab Results HIV 1 RNA QUANT (copies/mL)  Date Value  01/04/2014 39*  10/11/2013 1508*  01/26/2013 <20   HIV-1 RNA VIRAL LOAD (no units)  Date Value  06/21/2014 <40  09/15/2013 2647  09/15/2013 2647   CD4 (no units)  Date Value  09/15/2013 260  09/15/2013 260  06/15/2013 232   CD4 T CELL ABS  Date Value  10/11/2013 280 /uL*  08/24/2012 260 cmm*  02/13/2012 330 cmm*     Assessment: Her adherence has improved and as a result her viral load is now undetectable. I encouraged her to continue to try not to miss any doses of her medications.  She probably has some neuropathic pain in her right foot. I talked to her about treatment options but she does not want to start on any other medications. She already has some pill fatigue.  I encouraged her to make an appointment to see her eye doctor as soon as possible.  I encouraged her to continue her walking routine. Her BMI remains over 35.    Plan: 1. Simplify anti-retroviral regimen to Triumeq and Viread 2. Ophthalmology evaluation 3. Tylenol  as needed for neuropathic pain 4. Encouraged to continue her walking regimen 5. Follow-up after lab work in 3 months   Michel Bickers, MD [facility] [phone] pager   [phone] cell 08/23/2014, 10:21 AM

## 2014-08-23 NOTE — Progress Notes (Signed)
Patient ID: [name] [name], female   DOB: February 27, 1956, 59 y.o.   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 female presents to clinic for follow up of her HIV management.  Allergies: Allergies  Allergen Reactions  . Sulfa Antibiotics Other (See Comments)    Blood pressure drops.  . Penicillins Rash    Vitals: Temp: 98.5 F (36.9 C) (06/14 0926) Temp Source: Oral (06/14 0926) BP: 156/68 mmHg (06/14 0926) Pulse Rate: 71 (06/14 0926)  Past Medical History: Past Medical History  Diagnosis Date  . HIV (human immunodeficiency virus infection) 2007  . Hypertension   . GERD (gastroesophageal reflux disease)     Social History: History   Social History  . Marital Status: Widowed    Spouse Name: N/A  . Number of Children: 0  . Years of Education: 53 th grad   Occupational History  . distability     since 2010 for 042 status   Social History Main Topics  . Smoking status: Never Smoker   . Smokeless tobacco: Never Used  . Alcohol Use: No  . Drug Use: No     Comment: Former cocaine abuse, quit in 2000  . Sexual Activity: Not Currently     Comment: pt. declined condoms   Other Topics Concern  . None   Social History Narrative    Previous Regimen: Abacavir, dolutegravir, lamivudine, atazanvir, darunavir, emtricitabine, reyataz, tenofovir, ritonavir  Current Regimen: Abacavir, tenofovir, atazanavir/r  Labs: HIV 1 RNA QUANT (copies/mL)  Date Value  01/04/2014 39*  10/11/2013 1508*  01/26/2013 <20   HIV-1 RNA VIRAL LOAD (no units)  Date Value  06/21/2014 <40  09/15/2013 2647  09/15/2013 2647   CD4 (no units)  Date Value  09/15/2013 260  09/15/2013 260  06/15/2013 232   CD4 T CELL ABS  Date Value  10/11/2013 280 /uL*  08/24/2012 260 cmm*  02/13/2012 330 cmm*   HEP B S AB (no units)  Date Value  01/26/2013 POS*   HEPATITIS B SURFACE AG (no units)  Date Value  01/26/2013 NEGATIVE    HCV AB (no units)  Date Value  01/13/2014 NEGATIVE    HIV Genotype Composite Data Genotype Dates: 09/14/08, 10/11/13 Mutations in Rapelje impact drug susceptibility RT Mutations M184IV, K101PQ, K103N, P225H  PI Mutations None  Integrase Mutations Not analyzed   Interpretation of Genotype Data per [employer] HIV Database Nucleoside RTIs  lamivudine (3TC) High-level resistance abacavir (ABC) Low-level resistance zidovudine (AZT) Susceptible stavudine (D4T) Susceptible didanosine (DDI) Potential low-level resistance emtricitabine (FTC) High-level resistance tenofovir (TDF) Susceptible   Non-Nucleoside RTIs  efavirenz (EFV) High-level resistance etravirine (ETR) Intermediate resistance nevirapine (NVP) High-level resistance rilpivirine (RPV) High-level resistance   Protease Inhibitors     Integrase Inhibitors           CrCl: CrCl cannot be calculated (Unknown ideal weight.).  Lipids:    Component Value Date/Time   CHOL 173 01/13/2014 1041   TRIG 91 01/13/2014 1041   HDL 61 01/13/2014 1041   CHOLHDL 2.8 01/13/2014 1041   VLDL 18 01/13/2014 1041   LDLCALC 94 01/13/2014 1041    Assessment: [name] [name] has been experiencing pill fatigue and is currently taking 5 tablets a day, at this time she is virally suppressed but she expresses that she does not like taking this many medications. To prevent the patient from acquiring resistance due to noncompliance issues that may arise with this  regimen, Dr. Megan Salon consulted pharmacy for other options to decrease her pill burden.  Multiple regmiens were discussed with the patient including Triumeq/Prezcobix, Genvoya/Prezista, and Triumeq/Tenofovir. The patient is allergic to sulfa and takes famotidine once daily. With these findings her best option to avoid her allergy and drug-drug interactions is Triumeq/tenofovir. Patient was counseled that each of these medications are once a day and to take them at the same time every day. If she  is not compliant and builds further resistance, her options going forward are very limited. Patient verbalized understanding. Of note, patient is also enrolled in the reprieve study and was given her medication.  Recommendations: Stop current regimen Begin Triumeq/Viread once daily Stress compliance RTC per Dr. Glenice Bow, [place], Pharm.D., Clinical Infectious Disease [place] [facility] 08/23/2014, 11:09 AM

## 2014-10-10 ENCOUNTER — Other Ambulatory Visit: Payer: Self-pay | Admitting: Licensed Clinical Social Worker

## 2014-10-10 DIAGNOSIS — B20 Human immunodeficiency virus [HIV] disease: Secondary | ICD-10-CM

## 2014-10-10 MED ORDER — TENOFOVIR DISOPROXIL FUMARATE 300 MG PO TABS: 300.0000 mg | ORAL_TABLET | Freq: Every day | ORAL | Status: DC

## 2014-10-10 MED ORDER — ABACAVIR-DOLUTEGRAVIR-LAMIVUD 600-50-300 MG PO TABS: 1.0000 | ORAL_TABLET | Freq: Every day | ORAL | Status: DC

## 2014-10-18 ENCOUNTER — Encounter (INDEPENDENT_AMBULATORY_CARE_PROVIDER_SITE_OTHER): Payer: Self-pay | Admitting: *Deleted

## 2014-10-18 ENCOUNTER — Other Ambulatory Visit: Payer: Medicare Other

## 2014-10-18 ENCOUNTER — Encounter: Payer: Self-pay | Admitting: *Deleted

## 2014-10-18 VITALS — BP 126/84 | HR 73 | Temp 98.3°F | Resp 16 | Wt 183.5 lb

## 2014-10-18 DIAGNOSIS — Z79899 Other long term (current) drug therapy: Secondary | ICD-10-CM

## 2014-10-18 DIAGNOSIS — B20 Human immunodeficiency virus [HIV] disease: Secondary | ICD-10-CM

## 2014-10-18 DIAGNOSIS — Z006 Encounter for examination for normal comparison and control in clinical research program: Secondary | ICD-10-CM

## 2014-10-18 LAB — CBC
HEMATOCRIT: 38.1 % (ref 36.0–46.0)
HEMOGLOBIN: 12.6 g/dL (ref 12.0–15.0)
MCH: 30.1 pg (ref 26.0–34.0)
MCHC: 33.1 g/dL (ref 30.0–36.0)
MCV: 90.9 fL (ref 78.0–100.0)
MPV: 10.5 fL (ref 8.6–12.4)
Platelets: 204 10*3/uL (ref 150–400)
RBC: 4.19 MIL/uL (ref 3.87–5.11)
RDW: 13.5 % (ref 11.5–15.5)
WBC: 2.9 10*3/uL — ABNORMAL LOW (ref 4.0–10.5)

## 2014-10-18 LAB — COMPREHENSIVE METABOLIC PANEL
ALT: 12 U/L (ref 6–29)
AST: 17 U/L (ref 10–35)
Albumin: 4 g/dL (ref 3.6–5.1)
Alkaline Phosphatase: 95 U/L (ref 33–130)
BUN: 11 mg/dL (ref 7–25)
CHLORIDE: 107 mmol/L (ref 98–110)
CO2: 26 mmol/L (ref 20–31)
CREATININE: 0.68 mg/dL (ref 0.50–0.99)
Calcium: 9.1 mg/dL (ref 8.6–10.4)
GLUCOSE: 82 mg/dL (ref 65–99)
Potassium: 3.7 mmol/L (ref 3.5–5.3)
Sodium: 142 mmol/L (ref 135–146)
Total Bilirubin: 1.7 mg/dL — ABNORMAL HIGH (ref 0.2–1.2)
Total Protein: 7.8 g/dL (ref 6.1–8.1)

## 2014-10-18 LAB — LIPID PANEL
CHOL/HDL RATIO: 2.7 ratio (ref <=5.0)
CHOLESTEROL: 175 mg/dL (ref 125–200)
HDL: 66 mg/dL (ref >=46)
LDL CALC: 93 mg/dL (ref <130)
TRIGLYCERIDES: 80 mg/dL (ref <150)
VLDL: 16 mg/dL (ref <30)

## 2014-10-18 NOTE — Progress Notes (Signed)
[name] [name] is here for A5332, month 12. Still continues to have left foot numbness. She denies any muscle aches and/or weakness. She is concerned for Diabetes: states she feels as though she is urinating more and thirsty more. Her fasting glucose has been below 100 and Hgb A1c was 5.7% in 2015. We will do a fasting glucose on her today. Her weight has not changed. She states she has missed a couple of study medication. Pill count reveals #8 remain. Study med dispensed. She had a question about when she should start her new HIV medication regimen? I went over Dr. Hale Bogus notes and then asked her if she had filled the Triumeq. She did but had not started. I told her to stop taking Ziagen, Norvir, and Reyataz. Start taking Triumeq with the Viread. She verbalized understanding. She did not know how she would do with the size of Triumeq. I told her to let us know if it is a problem and maybe her regimen could be Viread, Epivir, and Tivicay instead..* No surgery found * maybe another option. She understood and states she would start taking the new regimen tonight. I went ahead and drew labs for Dr. Megan Salon since she was here. Her VL/CD4 will not reflect her new regimen but we will see her in November and could redraw this then. She received $50 gift card and 2 bus passes for visit. Next appointment is on November 23 at [place] for A5322. [name] Champagne RN

## 2014-10-19 LAB — RPR

## 2014-10-20 LAB — HIV-1 RNA QUANT-NO REFLEX-BLD
HIV 1 RNA Quant: <20 copies/mL (ref <20)
HIV-1 RNA Quant, Log: <1.3 {Log} (ref <1.30)

## 2014-11-10 ENCOUNTER — Other Ambulatory Visit: Payer: Medicare Other

## 2014-11-24 ENCOUNTER — Encounter: Payer: Self-pay | Admitting: Internal Medicine

## 2014-11-24 ENCOUNTER — Ambulatory Visit (INDEPENDENT_AMBULATORY_CARE_PROVIDER_SITE_OTHER): Payer: Self-pay | Admitting: Internal Medicine

## 2014-11-24 VITALS — BP 161/85 | HR 80 | Temp 98.2°F | Resp 16 | Ht 61.0 in | Wt 182.0 lb

## 2014-11-24 DIAGNOSIS — B20 Human immunodeficiency virus [HIV] disease: Secondary | ICD-10-CM

## 2014-11-24 NOTE — Assessment & Plan Note (Signed)
Her HIV infection is under very good control but I'm concerned that she may be having an adverse reaction to some component of Triumeq. I doubt this is abacavir hypersensitivity since she tolerated it for an extensive period of time in the past. [name] [name] met with Onnie Boer, our ID pharmacist today and we'll follow-up with him in the near future to discuss other treatment options if the itching persists.

## 2014-11-24 NOTE — Progress Notes (Signed)
Patient ID: [name] [name], female   DOB: 1956/02/24, 59 y.o.   MRN: [id number]          Patient Active Problem List   Diagnosis Date Noted  . Human immunodeficiency virus (HIV) disease 08/12/2006    Priority: High  . Dyslipidemia 03/30/2013    Priority: Medium  . HYPERTENSION 03/29/2009    Priority: Medium  . Left foot pain 01/18/2014  . Acid reflux 05/18/2013  . Rectal bleeding 06/26/2010  . LOW BACK PAIN, ACUTE 01/16/2010  . LESION, VAGINA 10/05/2009  . PAPANICOLAOU SMEAR OF VAGINA WITH LGSIL 01/25/2009  . VIRAL MENINGITIS 09/14/2008  . DEPRESSION 03/22/2008  . DENTAL CARIES 09/21/2007  . GROSS HEMATURIA 08/26/2007  . DEGENERATIVE JOINT DISEASE 08/12/2006  . GENITAL HERPES 01/13/2006  . ANEMIA 01/13/2006  . DEPENDENCE, COCAINE, UNSPECIFIED 01/13/2006  . ALLERGIC RHINITIS 01/13/2006  . GERD 01/13/2006  . PREGNANCY, ECTOPIC NOS W/O INTRAUTERINE PRG 01/13/2006  . ABORTION NOS W/PELVIC DAMAGE, UNSPECIFIED 01/13/2006  . ANGIOEDEMA 01/13/2006  . BUNIONECTOMY, HX OF 01/13/2006    Patient's Medications  New Prescriptions   No medications on file  Previous Medications   ABACAVIR-DOLUTEGRAVIR-LAMIVUD 600-50-300 MG TABS    Take 1 tablet by mouth daily.   DIAZEPAM (VALIUM) 2 MG TABLET    Take 1 tablet (2 mg total) by mouth every 12 (twelve) hours as needed for muscle spasms.   FAMOTIDINE (PEPCID) 40 MG TABLET    TAKE 1 TABLET BY MOUTH EVERY DAY   LISINOPRIL (PRINIVIL,ZESTRIL) 10 MG TABLET    TAKE 1 TABLET BY MOUTH EVERY DAY   PITAVASTATIN CALCIUM 4 MG TABS    Take 1 tablet (4 mg total) by mouth daily.   TENOFOVIR (VIREAD) 300 MG TABLET    Take 1 tablet (300 mg total) by mouth daily.   VALACYCLOVIR (VALTREX) 500 MG TABLET    TAKE 1 TABLET BY MOUTH THREE TIMES DAILY  Modified Medications   No medications on file  Discontinued Medications   No medications on file    Subjective: [name] [name] is in for her routine HIV follow-up visit. She started her simplified regimen of Triumeq  and Viread one month ago. She denies missing any doses. She began noting some itching on her palms and upper chest shortly after starting that regimen. She had been on a block of the air in the past without difficulty. She rates the itching on her palms as 7 out of 10. 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 She is not feeling depressed.   Review of Systems: Pertinent items are noted in HPI.  Past Medical History  Diagnosis Date  . HIV (human immunodeficiency virus infection) 2007  . Hypertension   . GERD (gastroesophageal reflux disease)     Social History  Substance Use Topics  . Smoking status: Never Smoker   . Smokeless tobacco: Never Used  . Alcohol Use: No    Family History  Problem Relation Age of Onset  . Coronary artery disease Mother   . Hypertension Mother   . Anxiety disorder Mother   . Arthritis Mother     rheumatoid  . Cancer Father     possible prostate cancer  . Diabetes Sister     Allergies  Allergen Reactions  . Sulfa Antibiotics Other (See Comments)    Blood pressure drops.  . Penicillins Rash    Objective:  Filed Vitals:   11/24/14 0855  BP:  161/85  Pulse: 80  Temp: 98.2 F (36.8 C)  TempSrc: Oral  Resp: 16  Height: _0  (1.549 m)  Weight: 182 lb (82.555 kg)   Body mass index is 34.41 kg/(m^2).  General: She is smiling and in good spirits Oral: No oropharyngeal lesions Skin: A few scattered erythematous papules on her chest anteriorly. There are no lesions on her palms Lungs: Clear Cor: Regular S1 and S2 with no murmurs  Lab Results Lab Results  Component Value Date   WBC 2.9* 10/18/2014   HGB 12.6 10/18/2014   HCT 38.1 10/18/2014   MCV 90.9 10/18/2014   PLT 204 10/18/2014    Lab Results  Component Value Date   CREATININE 0.68 10/18/2014   BUN 11 10/18/2014   NA 142 10/18/2014   K 3.7 10/18/2014   CL 107 10/18/2014   CO2 26 10/18/2014    Lab Results    Component Value Date   ALT 12 10/18/2014   AST 17 10/18/2014   ALKPHOS 95 10/18/2014   BILITOT 1.7* 10/18/2014    Lab Results  Component Value Date   CHOL 175 10/18/2014   HDL 66 10/18/2014   LDLCALC 93 10/18/2014   TRIG 80 10/18/2014   CHOLHDL 2.7 10/18/2014    Lab Results HIV 1 RNA QUANT (copies/mL)  Date Value  10/18/2014 <20  01/04/2014 39*  10/11/2013 1508*   HIV-1 RNA VIRAL LOAD (no units)  Date Value  06/21/2014 <40  09/15/2013 2647  09/15/2013 2647   CD4 (no units)  Date Value  09/15/2013 260  09/15/2013 260  06/15/2013 232   CD4 T CELL ABS  Date Value  10/11/2013 280 /uL*  08/24/2012 260 cmm*  02/13/2012 330 cmm*     Problem List Items Addressed This Visit      High   Human immunodeficiency virus (HIV) disease - Primary    Her HIV infection is under very good control but I'm concerned that she may be having an adverse reaction to some component of Triumeq. I doubt this is abacavir hypersensitivity since she tolerated it for an extensive period of time in the past. [name] [name] met with Onnie Boer, our ID pharmacist today and we'll follow-up with him in the near future to discuss other treatment options if the itching persists.           Michel Bickers, MD [facility] [phone] pager   [phone] cell 11/24/2014, 11:03 AM

## 2014-11-24 NOTE — Progress Notes (Signed)
Patient ID: [name] [name], female   DOB: October 31, 1955, 59 y.o.   MRN: [id number]  HPI: [name] [name] is a 59 y.o. female for her routine visit.   Allergies: Allergies  Allergen Reactions  . Sulfa Antibiotics Other (See Comments)    Blood pressure drops.  . Penicillins Rash    Vitals: Temp: 98.2 F (36.8 C) (09/15 0855) Temp Source: Oral (09/15 0855) BP: 161/85 mmHg (09/15 0855) Pulse Rate: 80 (09/15 0855)  Past Medical History: Past Medical History  Diagnosis Date  . HIV (human immunodeficiency virus infection) 2007  . Hypertension   . GERD (gastroesophageal reflux disease)     Social History: Social History   Social History  . Marital Status: Widowed    Spouse Name: N/A  . Number of Children: 0  . Years of Education: 58 th grad   Occupational History  . distability     since 2010 for 042 status   Social History Main Topics  . Smoking status: Never Smoker   . Smokeless tobacco: Never Used  . Alcohol Use: No  . Drug Use: No     Comment: Former cocaine abuse, quit in 2000  . Sexual Activity: Not Currently     Comment: pt. declined condoms   Other Topics Concern  . None   Social History Narrative    Previous Regimen: ABC, TDF, ATV/r  Current Regimen: Triumeq + TDF  Labs: HIV 1 RNA QUANT (copies/mL)  Date Value  10/18/2014 <20  01/04/2014 39*  10/11/2013 1508*   HIV-1 RNA VIRAL LOAD (no units)  Date Value  06/21/2014 <40  09/15/2013 2647  09/15/2013 2647   CD4 (no units)  Date Value  09/15/2013 260  09/15/2013 260  06/15/2013 232   CD4 T CELL ABS  Date Value  10/11/2013 280 /uL*  08/24/2012 260 cmm*  02/13/2012 330 cmm*   HEP B S AB (no units)  Date Value  01/26/2013 POS*   HEPATITIS B SURFACE AG (no units)  Date Value  01/26/2013 NEGATIVE   HCV AB (no units)  Date Value  01/13/2014 NEGATIVE    CrCl: CrCl cannot be calculated (Patient has no serum creatinine result on file.).  Lipids:    Component Value Date/Time   CHOL 175 10/18/2014 1226   TRIG 80 10/18/2014 1226   HDL 66 10/18/2014 1226   CHOLHDL 2.7 10/18/2014 1226   VLDL 16 10/18/2014 1226   LDLCALC 93 10/18/2014 1226    Assessment: Temara who is here for her routine visit for her HIV. She is well controlled on her regimen. She did complain of itching but no rash was seen. Her regimen was previously simplified to Triumeq + TDF due to her resistance. She also stated that stress can also cause her to itch. Since she has been on ABC in the past, this is probably not related to it. She has also been on TDF. Told to her to use benaryl symptomatic relief x 1 week and if symptoms return she will call here to set up an appt with me. We can prob do Genvova + ATV (due to her sulfa allergy).  Recommendations:  Cont current regimen Use benadryl and hydrocortisone for symptomatic relieve Consider Genvoya + ATV if symptoms persistent  Wilfred Lacy, PharmD Clinical Infectious [place] [facility] 11/24/2014, 9:34 AM

## 2014-12-08 NOTE — Progress Notes (Signed)
Notified [facility] via fax.  Howell, Michelle M, RN  

## 2015-01-30 ENCOUNTER — Other Ambulatory Visit: Payer: Self-pay | Admitting: Internal Medicine

## 2015-02-01 ENCOUNTER — Encounter (INDEPENDENT_AMBULATORY_CARE_PROVIDER_SITE_OTHER): Payer: Self-pay | Admitting: *Deleted

## 2015-02-01 VITALS — BP 137/92 | HR 74 | Temp 98.1°F | Resp 16 | Ht 60.0 in | Wt 183.8 lb

## 2015-02-01 DIAGNOSIS — E1110 Type 2 diabetes mellitus with ketoacidosis without coma: Secondary | ICD-10-CM

## 2015-02-01 DIAGNOSIS — R3915 Urgency of urination: Secondary | ICD-10-CM

## 2015-02-01 DIAGNOSIS — Z21 Asymptomatic human immunodeficiency virus [HIV] infection status: Secondary | ICD-10-CM

## 2015-02-01 DIAGNOSIS — R7303 Prediabetes: Secondary | ICD-10-CM

## 2015-02-01 DIAGNOSIS — Z006 Encounter for examination for normal comparison and control in clinical research program: Secondary | ICD-10-CM

## 2015-02-01 LAB — HIV-1 RNA QUANT-NO REFLEX-BLD

## 2015-02-01 LAB — LIPID PANEL
CHOLESTEROL: 191 mg/dL (ref 125–200)
HDL: 71 mg/dL (ref >=46)
LDL Cholesterol: 111 mg/dL (ref <130)
TRIGLYCERIDES: 45 mg/dL (ref <150)
Total CHOL/HDL Ratio: 2.7 Ratio (ref <=5.0)
VLDL: 9 mg/dL (ref <30)

## 2015-02-01 LAB — COMPREHENSIVE METABOLIC PANEL
ALBUMIN: 4.1 g/dL (ref 3.6–5.1)
ALK PHOS: 93 U/L (ref 33–130)
ALT: 21 U/L (ref 6–29)
AST: 21 U/L (ref 10–35)
BILIRUBIN TOTAL: 0.7 mg/dL (ref 0.2–1.2)
BUN: 11 mg/dL (ref 7–25)
CALCIUM: 9.3 mg/dL (ref 8.6–10.4)
CO2: 27 mmol/L (ref 20–31)
Chloride: 103 mmol/L (ref 98–110)
Creat: 0.76 mg/dL (ref 0.50–0.99)
GLUCOSE: 72 mg/dL (ref 65–99)
Potassium: 3.7 mmol/L (ref 3.5–5.3)
Sodium: 140 mmol/L (ref 135–146)
Total Protein: 8 g/dL (ref 6.1–8.1)

## 2015-02-01 LAB — POCT URINALYSIS DIPSTICK
GLUCOSE UA: NEGATIVE
NITRITE UA: NEGATIVE
Protein, UA: 30
Spec Grav, UA: 1.025
Urobilinogen, UA: 0.2
pH, UA: 5.5

## 2015-02-01 LAB — HEMOGLOBIN A1C
HEMOGLOBIN A1C: 5.8 % — AB (ref <5.7)
Mean Plasma Glucose: 120 mg/dL — ABNORMAL HIGH (ref <117)

## 2015-02-01 LAB — CD4/CD8 (T-HELPER/T-SUPPRESSOR CELL)
CD4 % Helper T Cell: 25.5
CD4: 332
CD8 % Suppressor T Cell: 38.8
CD8: 504

## 2015-02-01 NOTE — Addendum Note (Signed)
Addended by: Bobbie Stack on: 02/01/2015 12:43 PM   Modules accepted: Orders

## 2015-02-01 NOTE — Progress Notes (Signed)
[name] [name] is here for her week 47 visit for The HAILO Study: A Long Term follow-up of Older HIV-Infected Adults in the ACTG, an observational study addressing the issues of aging, HIV infection and Inflammation and month 16 visit for Reprieve, A Randomized Trial to Prevent Vascular Events in HIV (study drug is Pitavastatin 4mg  or placebo). She says she has noticed an increase in urinary frequency over the past month.Her urine was very dark and looked like it had some blood in it when collected for study. We decided to go ahead and do a dipstick on it to check for infection. She says she drinks very little water and does not get thirsty. I encouraged her to make a habit of sipping on water throughout the day. She also said she hadn't eaten since yesterday at 10am and says that she often forgets to eat and doesn't get hungry. When asked why she hadn't eaten she said her brother is living with her now and had made her upset. She is talking about moving to [place] this coming year to help establish a rehab there. We will transfer her to research clinical site there if she does move, otherwise her next research appt is in April.

## 2015-02-02 LAB — CREATININE, URINE, RANDOM: Creatinine, Urine: 457 mg/dL — ABNORMAL HIGH (ref 20–320)

## 2015-02-02 LAB — PROTEIN, URINE, RANDOM: Total Protein, Urine: 23 mg/dL (ref 5–24)

## 2015-02-15 ENCOUNTER — Encounter: Payer: Self-pay | Admitting: Infectious Disease

## 2015-02-21 ENCOUNTER — Encounter: Payer: Self-pay | Admitting: Internal Medicine

## 2015-02-21 ENCOUNTER — Ambulatory Visit (INDEPENDENT_AMBULATORY_CARE_PROVIDER_SITE_OTHER): Payer: Self-pay | Admitting: Internal Medicine

## 2015-02-21 VITALS — BP 170/98 | HR 74 | Temp 98.1°F | Wt 190.0 lb

## 2015-02-21 DIAGNOSIS — B20 Human immunodeficiency virus [HIV] disease: Secondary | ICD-10-CM

## 2015-02-21 NOTE — Assessment & Plan Note (Signed)
Her HIV infection is under excellent control. Her viral load remains undetectable and her most recent CD4 count in November was 332. I will change Viread to the new, safer preparation called Vemlidy as soon as it is available and continue it with Triumeq. She will follow-up in 6 months before her move. She is already received influenza vaccination.

## 2015-02-21 NOTE — Progress Notes (Signed)
Patient Active Problem List   Diagnosis Date Noted  . Human immunodeficiency virus (HIV) disease ([place]) 08/12/2006    Priority: High  . Dyslipidemia 03/30/2013    Priority: Medium  . HYPERTENSION 03/29/2009    Priority: Medium  . Left foot pain 01/18/2014  . Acid reflux 05/18/2013  . Rectal bleeding 06/26/2010  . LOW BACK PAIN, ACUTE 01/16/2010  . LESION, VAGINA 10/05/2009  . PAPANICOLAOU SMEAR OF VAGINA WITH LGSIL 01/25/2009  . VIRAL MENINGITIS 09/14/2008  . DEPRESSION 03/22/2008  . DENTAL CARIES 09/21/2007  . GROSS HEMATURIA 08/26/2007  . DEGENERATIVE JOINT DISEASE 08/12/2006  . GENITAL HERPES 01/13/2006  . ANEMIA 01/13/2006  . DEPENDENCE, COCAINE, UNSPECIFIED 01/13/2006  . ALLERGIC RHINITIS 01/13/2006  . GERD 01/13/2006  . PREGNANCY, ECTOPIC NOS W/O INTRAUTERINE PRG 01/13/2006  . ABORTION NOS W/PELVIC DAMAGE, UNSPECIFIED 01/13/2006  . ANGIOEDEMA 01/13/2006  . BUNIONECTOMY, HX OF 01/13/2006    Patient's Medications  New Prescriptions   No medications on file  Previous Medications   ABACAVIR-DOLUTEGRAVIR-LAMIVUD 600-50-300 MG TABS    Take 1 tablet by mouth daily.   FAMOTIDINE (PEPCID) 40 MG TABLET    TAKE 1 TABLET BY MOUTH EVERY DAY   LISINOPRIL (PRINIVIL,ZESTRIL) 10 MG TABLET    TAKE 1 TABLET BY MOUTH EVERY DAY   PITAVASTATIN CALCIUM 4 MG TABS    Take 1 tablet (4 mg total) by mouth daily.   VALACYCLOVIR (VALTREX) 500 MG TABLET    TAKE 1 TABLET BY MOUTH THREE TIMES DAILY  Modified Medications   No medications on file  Discontinued Medications   DIAZEPAM (VALIUM) 2 MG TABLET    Take 1 tablet (2 mg total) by mouth every 12 (twelve) hours as needed for muscle spasms.   TENOFOVIR (VIREAD) 300 MG TABLET    Take 1 tablet (300 mg total) by mouth daily.    Subjective: [name] [name] is in for her routine HIV follow-up visit. Her itching resolved shortly after her last visit and she states that she is taking and tolerating her Triumeq and Viread well. She recalls  missing only one dose when she was away from home in the evening and did not have her medication. She usually takes her medications around 8 PM. She recently had a period where her urine was very dark. She states that she does not drink much water during the day. Her urine is now a normal color. She's not had any dysuria. She states that she will be moving to [facility] next July. Her church is going to help her start a drug treatment program there.   Review of Systems: Review of Systems  Constitutional: Negative for fever, chills, weight loss, malaise/fatigue and diaphoresis.  HENT: Negative for sore throat.   Respiratory: Negative for cough, sputum production and shortness of breath.   Cardiovascular: Negative for chest pain.  Gastrointestinal: Negative for nausea, vomiting and diarrhea.  Genitourinary: Negative for dysuria and frequency.  Musculoskeletal: Negative for myalgias and joint pain.  Skin: Negative for rash.  Neurological: Negative for focal weakness.  Psychiatric/Behavioral: Negative for depression and substance abuse. The patient is not nervous/anxious.     Past Medical History  Diagnosis Date  . HIV (human immunodeficiency virus infection) ([place]) 2007  . Hypertension   . GERD (gastroesophageal reflux disease)     Social History  Substance Use Topics  . Smoking status: Never Smoker   . Smokeless tobacco: Never Used  . Alcohol Use: No    Family History  Problem Relation Age of Onset  . Coronary artery disease Mother   . Hypertension Mother   . Anxiety disorder Mother   . Arthritis Mother     rheumatoid  . Cancer Father     possible prostate cancer  . Diabetes Sister     Allergies  Allergen Reactions  . Sulfa Antibiotics Other (See Comments)    Blood pressure drops.  . Penicillins Rash    Objective:  Filed Vitals:   02/21/15 0846  BP: 170/98  Pulse: 74  Temp: 98.1 F (36.7 C)  TempSrc: Oral  Weight: 190 lb (86.183 kg)   Body mass index is 37.11  kg/(m^2).  Physical Exam  Constitutional: She is oriented to person, place, and time.  She is smiling and in good spirits  HENT:  Mouth/Throat: No oropharyngeal exudate.  Eyes: Conjunctivae are normal.  Cardiovascular: Normal rate and regular rhythm.   No murmur heard. Pulmonary/Chest: Breath sounds normal.  Abdominal: Soft.  Neurological: She is alert and oriented to person, place, and time.  Skin: No rash noted.  Psychiatric: Mood and affect normal.    Lab Results Lab Results  Component Value Date   WBC 2.9* 10/18/2014   HGB 12.6 10/18/2014   HCT 38.1 10/18/2014   MCV 90.9 10/18/2014   PLT 204 10/18/2014    Lab Results  Component Value Date   CREATININE 0.76 02/01/2015   BUN 11 02/01/2015   NA 140 02/01/2015   K 3.7 02/01/2015   CL 103 02/01/2015   CO2 27 02/01/2015    Lab Results  Component Value Date   ALT 21 02/01/2015   AST 21 02/01/2015   ALKPHOS 93 02/01/2015   BILITOT 0.7 02/01/2015    Lab Results  Component Value Date   CHOL 191 02/01/2015   HDL 71 02/01/2015   LDLCALC 111 02/01/2015   TRIG 45 02/01/2015   CHOLHDL 2.7 02/01/2015    Lab Results HIV 1 RNA QUANT (copies/mL)  Date Value  10/18/2014 <20  01/04/2014 39*  10/11/2013 1508*   HIV-1 RNA VIRAL LOAD (no units)  Date Value  02/01/2015 <40  06/21/2014 <40  09/15/2013 2647  09/15/2013 2647   CD4 (no units)  Date Value  09/15/2013 260  09/15/2013 260  06/15/2013 232   CD4 T CELL ABS  Date Value  10/11/2013 280 /uL*  08/24/2012 260 cmm*  02/13/2012 330 cmm*      Problem List Items Addressed This Visit      High   Human immunodeficiency virus (HIV) disease ([place]) - Primary    Her HIV infection is under excellent control. Her viral load remains undetectable and her most recent CD4 count in November was 332. I will change Viread to the new, safer preparation called Vemlidy as soon as it is available and continue it with Triumeq. She will follow-up in 6 months before her move. She  is already received influenza vaccination.           Michel Bickers, MD [facility] [phone] pager   [phone] cell 02/21/2015, 9:04 AM

## 2015-05-19 ENCOUNTER — Encounter (HOSPITAL_COMMUNITY): Payer: Self-pay | Admitting: Emergency Medicine

## 2015-05-19 ENCOUNTER — Emergency Department (HOSPITAL_COMMUNITY)
Admission: EM | Admit: 2015-05-19 | Discharge: 2015-05-19 | Disposition: A | Discharge status: Home / Self Care | Payer: Medicare Other | Source: Home / Self Care | Attending: Emergency Medicine | Admitting: Emergency Medicine

## 2015-05-19 ENCOUNTER — Emergency Department (HOSPITAL_COMMUNITY): Payer: Medicare Other

## 2015-05-19 DIAGNOSIS — Z79899 Other long term (current) drug therapy: Secondary | ICD-10-CM

## 2015-05-19 DIAGNOSIS — B9789 Other viral agents as the cause of diseases classified elsewhere: Principal | ICD-10-CM

## 2015-05-19 DIAGNOSIS — B20 Human immunodeficiency virus [HIV] disease: Secondary | ICD-10-CM | POA: Insufficient documentation

## 2015-05-19 DIAGNOSIS — Z880 Allergy status to penicillin: Secondary | ICD-10-CM

## 2015-05-19 DIAGNOSIS — K219 Gastro-esophageal reflux disease without esophagitis: Secondary | ICD-10-CM

## 2015-05-19 DIAGNOSIS — I10 Essential (primary) hypertension: Secondary | ICD-10-CM | POA: Insufficient documentation

## 2015-05-19 DIAGNOSIS — R062 Wheezing: Secondary | ICD-10-CM

## 2015-05-19 DIAGNOSIS — J069 Acute upper respiratory infection, unspecified: Secondary | ICD-10-CM

## 2015-05-19 LAB — BASIC METABOLIC PANEL
Anion gap: 12 (ref 5–15)
BUN: <5 mg/dL — ABNORMAL LOW (ref 6–20)
CALCIUM: 8.9 mg/dL (ref 8.9–10.3)
CHLORIDE: 107 mmol/L (ref 101–111)
CO2: 24 mmol/L (ref 22–32)
CREATININE: 0.81 mg/dL (ref 0.44–1.00)
Glucose, Bld: 101 mg/dL — ABNORMAL HIGH (ref 65–99)
Potassium: 3.4 mmol/L — ABNORMAL LOW (ref 3.5–5.1)
SODIUM: 143 mmol/L (ref 135–145)

## 2015-05-19 LAB — URINE MICROSCOPIC-ADD ON

## 2015-05-19 LAB — I-STAT TROPONIN, ED: TROPONIN I, POC: 0 ng/mL (ref 0.00–0.08)

## 2015-05-19 LAB — CBC
HCT: 36.3 % (ref 36.0–46.0)
Hemoglobin: 11.8 g/dL — ABNORMAL LOW (ref 12.0–15.0)
MCH: 30.8 pg (ref 26.0–34.0)
MCHC: 32.5 g/dL (ref 30.0–36.0)
MCV: 94.8 fL (ref 78.0–100.0)
PLATELETS: 189 10*3/uL (ref 150–400)
RBC: 3.83 MIL/uL — AB (ref 3.87–5.11)
RDW: 13.3 % (ref 11.5–15.5)
WBC: 4.1 10*3/uL (ref 4.0–10.5)

## 2015-05-19 LAB — URINALYSIS, ROUTINE W REFLEX MICROSCOPIC
BILIRUBIN URINE: NEGATIVE
Glucose, UA: NEGATIVE mg/dL
KETONES UR: NEGATIVE mg/dL
NITRITE: NEGATIVE
Protein, ur: NEGATIVE mg/dL
Specific Gravity, Urine: 1.012 (ref 1.005–1.030)
pH: 7.5 (ref 5.0–8.0)

## 2015-05-19 LAB — BRAIN NATRIURETIC PEPTIDE: B NATRIURETIC PEPTIDE 5: 39.7 pg/mL (ref 0.0–100.0)

## 2015-05-19 MED ORDER — DEXAMETHASONE 4 MG PO TABS
10.0000 mg | ORAL_TABLET | Freq: Once | ORAL | Status: AC
Start: 2015-05-19 — End: 2015-05-19
  Administered 2015-05-19: 10 mg via ORAL
  Filled 2015-05-19: qty 3

## 2015-05-19 MED ORDER — IPRATROPIUM-ALBUTEROL 0.5-2.5 (3) MG/3ML IN SOLN
3.0000 mL | Freq: Once | RESPIRATORY_TRACT | Status: AC
  Administered 2015-05-19: 3 mL via RESPIRATORY_TRACT
  Filled 2015-05-19: qty 3

## 2015-05-19 MED ORDER — ALBUTEROL SULFATE HFA 108 (90 BASE) MCG/ACT IN AERS
2.0000 | INHALATION_SPRAY | RESPIRATORY_TRACT | Status: DC | PRN
  Administered 2015-05-19: 2 via RESPIRATORY_TRACT
  Filled 2015-05-19: qty 6.7

## 2015-05-19 MED ORDER — BENZONATATE 100 MG PO CAPS: 100.0000 mg | ORAL_CAPSULE | Freq: Three times a day (TID) | ORAL | Status: DC | PRN

## 2015-05-19 NOTE — ED Notes (Signed)
Patient transported to X-ray 

## 2015-05-19 NOTE — ED Notes (Signed)
Per EMS, pt from home with c/o shortness of breath and chest tightness. Pt noticed productive cough with yellow sputum yesterday. Per pt, she has a hx of meningitis and these were the same symptoms she had upon diagnosis. BP-159/94, HR-80, 98% ra

## 2015-05-19 NOTE — ED Provider Notes (Signed)
CSN: [id number]     Arrival date & time 05/19/15  [id number] History   First MD Initiated Contact with Patient 05/19/15 0710     Chief Complaint  Patient presents with  . Shortness of Breath     (Consider location/radiation/quality/duration/timing/severity/associated sxs/prior Treatment) HPI Comments: 60yo F w/ HIV on HAART, HTN, GERD who p/w shortness of breath and cough. Patient states that for the past 2-3 weeks, she has noticed wheezing at night. She has a history of asthma as a child but states that she "grew out of it" and has not required any treatments as an adult. Yesterday, she began having cough productive of white-yellow sputum. She also began noticing some shortness of breath while she was walking to her car associated with chest tightness. The shortness of breath and chest tightness resolved at rest. She endorses runny nose but denies any sore throat, vomiting, or diarrhea. She denies any history of smoking.  FH- Mother died of heart disease at 62yo  Patient is a 60 y.o. female presenting with shortness of breath. The history is provided by the patient.  Shortness of Breath   Past Medical History  Diagnosis Date  . HIV (human immunodeficiency virus infection) ([place]) 2007  . Hypertension   . GERD (gastroesophageal reflux disease)    Past Surgical History  Procedure Laterality Date  . Ectopic pregnancy surgery      s/p right salpinooophrectomy  . Bunionectomy    . Uterine fibroid surgery     Family History  Problem Relation Age of Onset  . Coronary artery disease Mother   . Hypertension Mother   . Anxiety disorder Mother   . Arthritis Mother     rheumatoid  . Cancer Father     possible prostate cancer  . Diabetes Sister    Social History  Substance Use Topics  . Smoking status: Never Smoker   . Smokeless tobacco: Never Used  . Alcohol Use: No   OB History    No data available     Review of Systems  Respiratory: Positive for shortness of breath.    10  Systems reviewed and are negative for acute change except as noted in the HPI.    Allergies  Sulfa antibiotics and Penicillins  Home Medications   Prior to Admission medications   Medication Sig Start Date End Date Taking? Authorizing Provider  Abacavir-Dolutegravir-Lamivud [id number] MG TABS Take 1 tablet by mouth daily. 10/10/14   Thayer Headings, MD  famotidine (PEPCID) 40 MG tablet TAKE 1 TABLET BY MOUTH EVERY DAY 01/30/15   Michel Bickers, MD  lisinopril (PRINIVIL,ZESTRIL) 10 MG tablet TAKE 1 TABLET BY MOUTH EVERY DAY 01/30/15   Michel Bickers, MD  Pitavastatin Calcium 4 MG TABS Take 1 tablet (4 mg total) by mouth daily. 10/18/13   Truman Hayward, MD  valACYclovir (VALTREX) 500 MG tablet TAKE 1 TABLET BY MOUTH THREE TIMES DAILY Patient not taking: Reported on 02/21/2015 07/25/14   Michel Bickers, MD   BP 147/93 mmHg  Pulse 97  Temp(Src) 98.1 F (36.7 C) (Oral)  Resp 19  Ht 5\' 1"  (1.549 m)  Wt 205 lb (92.987 kg)  BMI 38.75 kg/m2  SpO2 100% Physical Exam  Constitutional: She is oriented to person, place, and time. She appears well-developed and well-nourished. No distress.  Frequent cough  HENT:  Head: Normocephalic and atraumatic.  Mouth/Throat: Oropharynx is clear and moist.  Moist mucous membranes  Eyes: Conjunctivae are normal. Pupils are equal, round, and reactive to light.  Neck: Neck supple.  Cardiovascular: Normal rate, regular rhythm and normal heart sounds.   No murmur heard. Pulmonary/Chest: Effort normal. No respiratory distress. She has wheezes.  Diffuse Faint end-expiratory wheezes b/l; coarse BS b/l; mildly diminished b/l bases  Abdominal: Soft. Bowel sounds are normal. She exhibits no distension. There is no tenderness.  Musculoskeletal: She exhibits no edema.  Neurological: She is alert and oriented to person, place, and time.  Fluent speech  Skin: Skin is warm and dry.  Psychiatric: She has a normal mood and affect. Judgment normal.  Nursing note and  vitals reviewed.   ED Course  Procedures (including critical care time) Labs Review Labs Reviewed  BASIC METABOLIC PANEL - Abnormal; Notable for the following:    Potassium 3.4 (*)    Glucose, Bld 101 (*)    BUN <5 (*)    All other components within normal limits  CBC - Abnormal; Notable for the following:    RBC 3.83 (*)    Hemoglobin 11.8 (*)    All other components within normal limits  URINALYSIS, ROUTINE W REFLEX MICROSCOPIC (NOT AT [facility]) - Abnormal; Notable for the following:    APPearance CLOUDY (*)    Hgb urine dipstick TRACE (*)    Leukocytes, UA SMALL (*)    All other components within normal limits  URINE MICROSCOPIC-ADD ON - Abnormal; Notable for the following:    Squamous Epithelial / LPF 0-5 (*)    Bacteria, UA FEW (*)    All other components within normal limits  BRAIN NATRIURETIC PEPTIDE  I-STAT TROPOININ, ED    Imaging Review Dg Chest 2 View  05/19/2015  CLINICAL DATA:  Cough chest tightness for 1 day, history of HIV EXAM: CHEST  2 VIEW COMPARISON:  06/25/2012 FINDINGS: The heart size and mediastinal contours are within normal limits. Both lungs are clear. The visualized skeletal structures are unremarkable. IMPRESSION: No active cardiopulmonary disease. Electronically Signed   By: Skipper Cliche M.D.   On: 05/19/2015 08:11   I have personally reviewed and evaluated these lab results as part of my medical decision-making.   EKG Interpretation   Date/Time:  Friday May 19 2015 06:42:24 EST Ventricular Rate:  88 PR Interval:  138 QRS Duration: 79 QT Interval:  377 QTC Calculation: 456 R Axis:   24 Text Interpretation:  Sinus rhythm Low voltage, precordial leads No  significant change since last tracing Confirmed by LITTLE MD, RACHEL  [id number]) on 05/19/2015 7:11:48 AM Also confirmed by LITTLE MD, RACHEL  [phone]), editor Gilford Rile, CCT, SANDRA ([id number])  on 05/19/2015 8:20:47 AM     Medications  albuterol (PROVENTIL HFA;VENTOLIN HFA) 108 (90 Base) MCG/ACT  inhaler 2 puff (2 puffs Inhalation Given 05/19/15 0848)  ipratropium-albuterol (DUONEB) 0.5-2.5 (3) MG/3ML nebulizer solution 3 mL (3 mLs Nebulization Given 05/19/15 0754)  dexamethasone (DECADRON) tablet 10 mg (10 mg Oral Given 05/19/15 0847)    MDM   Final diagnoses:  Viral upper respiratory tract infection with cough  Wheezing   Pt w/ h/o HIV well-controlled on HAART, viral load undetectable, p/w 1 day of productive cough and runny nose in setting of 2-3 weeks of wheezing at night and exertional SOB and chest tightness yesterday. On exam, she was coughing but in NAD. VS notable for mild HTN, O2 sat 100% on RA. Normal WOB, faint end-exp wheezes b/l. Gave duoneb and obtained above labs and CXR. EKG unchanged from previous, sinus rhythm.   Above lab work including BNP and troponin unremarkable. Chest x-ray normal. No  risk factors for blood clot therefore PE very unlikely to be causing SOB. On reexamination, the patient was still coughing but stated that her breathing had improved after the DuoNeb. Wheezes improved on re-auscultation. B/c of her reactive airways component of sx, gave decadron and albuterol to use at home; instructed on use. Her symptoms of productive cough and runny nose are consistent with a viral syndrome. Her chest tightness yesterday was in the setting of exertion and she has not had any chest pain or chest tightness symptoms at rest. Her HEART score is 2 and I feel her chest tightness was more related to SOB, less likely cardiac etiology. I have instructed on supportive care and provided with Tessalon. Emphasized the importance of return precautions including any chest pain at rest or any worsening symptoms. Patient voiced understanding and was discharged in satisfactory condition.  Sharlett Iles, MD 05/19/15 [phone]

## 2015-05-20 ENCOUNTER — Encounter (HOSPITAL_COMMUNITY): Payer: Self-pay

## 2015-05-20 DIAGNOSIS — Z818 Family history of other mental and behavioral disorders: Secondary | ICD-10-CM

## 2015-05-20 DIAGNOSIS — J209 Acute bronchitis, unspecified: Secondary | ICD-10-CM | POA: Diagnosis present

## 2015-05-20 DIAGNOSIS — E876 Hypokalemia: Secondary | ICD-10-CM | POA: Diagnosis present

## 2015-05-20 DIAGNOSIS — Z21 Asymptomatic human immunodeficiency virus [HIV] infection status: Secondary | ICD-10-CM | POA: Diagnosis not present

## 2015-05-20 DIAGNOSIS — B20 Human immunodeficiency virus [HIV] disease: Secondary | ICD-10-CM | POA: Diagnosis present

## 2015-05-20 DIAGNOSIS — I10 Essential (primary) hypertension: Secondary | ICD-10-CM | POA: Diagnosis present

## 2015-05-20 DIAGNOSIS — J45902 Unspecified asthma with status asthmaticus: Principal | ICD-10-CM | POA: Diagnosis present

## 2015-05-20 DIAGNOSIS — Z833 Family history of diabetes mellitus: Secondary | ICD-10-CM

## 2015-05-20 DIAGNOSIS — Z8261 Family history of arthritis: Secondary | ICD-10-CM

## 2015-05-20 DIAGNOSIS — K219 Gastro-esophageal reflux disease without esophagitis: Secondary | ICD-10-CM | POA: Diagnosis present

## 2015-05-20 MED ORDER — ALBUTEROL SULFATE (2.5 MG/3ML) 0.083% IN NEBU
INHALATION_SOLUTION | RESPIRATORY_TRACT | Status: AC
  Filled 2015-05-20: qty 6

## 2015-05-20 MED ORDER — ALBUTEROL SULFATE (2.5 MG/3ML) 0.083% IN NEBU
5.0000 mg | INHALATION_SOLUTION | Freq: Once | RESPIRATORY_TRACT | Status: AC
  Administered 2015-05-20: 5 mg via RESPIRATORY_TRACT

## 2015-05-20 NOTE — ED Notes (Signed)
Pt reports was at ED last night for cough-productive, white thin, chills, runny nose,.  Talking in complete sentences.  Pt was d/c with inhaler, using q 4-5 hours with no relief.

## 2015-05-21 ENCOUNTER — Encounter (HOSPITAL_COMMUNITY): Payer: Self-pay | Admitting: Emergency Medicine

## 2015-05-21 ENCOUNTER — Inpatient Hospital Stay (HOSPITAL_COMMUNITY)
Admission: EM | Admit: 2015-05-21 | Discharge: 2015-05-24 | DRG: 202 | Disposition: A | Discharge status: Home / Self Care | Payer: Medicare Other | Attending: Internal Medicine | Admitting: Internal Medicine

## 2015-05-21 ENCOUNTER — Emergency Department (HOSPITAL_COMMUNITY): Payer: Medicare Other

## 2015-05-21 DIAGNOSIS — Z818 Family history of other mental and behavioral disorders: Secondary | ICD-10-CM | POA: Diagnosis not present

## 2015-05-21 DIAGNOSIS — J45902 Unspecified asthma with status asthmaticus: Secondary | ICD-10-CM | POA: Diagnosis present

## 2015-05-21 DIAGNOSIS — K219 Gastro-esophageal reflux disease without esophagitis: Secondary | ICD-10-CM | POA: Diagnosis present

## 2015-05-21 DIAGNOSIS — J209 Acute bronchitis, unspecified: Secondary | ICD-10-CM | POA: Diagnosis present

## 2015-05-21 DIAGNOSIS — E876 Hypokalemia: Secondary | ICD-10-CM | POA: Diagnosis present

## 2015-05-21 DIAGNOSIS — J4522 Mild intermittent asthma with status asthmaticus: Secondary | ICD-10-CM

## 2015-05-21 DIAGNOSIS — B20 Human immunodeficiency virus [HIV] disease: Secondary | ICD-10-CM

## 2015-05-21 DIAGNOSIS — Z8261 Family history of arthritis: Secondary | ICD-10-CM | POA: Diagnosis not present

## 2015-05-21 DIAGNOSIS — Z833 Family history of diabetes mellitus: Secondary | ICD-10-CM | POA: Diagnosis not present

## 2015-05-21 DIAGNOSIS — Z21 Asymptomatic human immunodeficiency virus [HIV] infection status: Secondary | ICD-10-CM | POA: Diagnosis present

## 2015-05-21 DIAGNOSIS — J45909 Unspecified asthma, uncomplicated: Secondary | ICD-10-CM | POA: Diagnosis present

## 2015-05-21 DIAGNOSIS — I10 Essential (primary) hypertension: Secondary | ICD-10-CM | POA: Diagnosis present

## 2015-05-21 LAB — CBC
HEMATOCRIT: 33.4 % — AB (ref 36.0–46.0)
HEMOGLOBIN: 10.6 g/dL — AB (ref 12.0–15.0)
MCH: 30.2 pg (ref 26.0–34.0)
MCHC: 31.7 g/dL (ref 30.0–36.0)
MCV: 95.2 fL (ref 78.0–100.0)
Platelets: 169 10*3/uL (ref 150–400)
RBC: 3.51 MIL/uL — AB (ref 3.87–5.11)
RDW: 13.6 % (ref 11.5–15.5)
WBC: 2.9 10*3/uL — AB (ref 4.0–10.5)

## 2015-05-21 LAB — CBC WITH DIFFERENTIAL/PLATELET
BASOS ABS: 0 10*3/uL (ref 0.0–0.1)
BASOS PCT: 0 %
Eosinophils Absolute: 0 10*3/uL (ref 0.0–0.7)
Eosinophils Relative: 1 %
HEMATOCRIT: 33.1 % — AB (ref 36.0–46.0)
HEMOGLOBIN: 10.8 g/dL — AB (ref 12.0–15.0)
LYMPHS PCT: 24 %
Lymphs Abs: 1 10*3/uL (ref 0.7–4.0)
MCH: 30.6 pg (ref 26.0–34.0)
MCHC: 32.6 g/dL (ref 30.0–36.0)
MCV: 93.8 fL (ref 78.0–100.0)
MONO ABS: 0.7 10*3/uL (ref 0.1–1.0)
MONOS PCT: 18 %
NEUTROS ABS: 2.2 10*3/uL (ref 1.7–7.7)
NEUTROS PCT: 57 %
Platelets: 169 10*3/uL (ref 150–400)
RBC: 3.53 MIL/uL — ABNORMAL LOW (ref 3.87–5.11)
RDW: 13.4 % (ref 11.5–15.5)
WBC: 3.9 10*3/uL — ABNORMAL LOW (ref 4.0–10.5)

## 2015-05-21 LAB — I-STAT CHEM 8, ED
BUN: 5 mg/dL — AB (ref 6–20)
CREATININE: 0.8 mg/dL (ref 0.44–1.00)
Calcium, Ion: 1.05 mmol/L — ABNORMAL LOW (ref 1.13–1.30)
Chloride: 102 mmol/L (ref 101–111)
GLUCOSE: 96 mg/dL (ref 65–99)
HEMATOCRIT: 35 % — AB (ref 36.0–46.0)
Hemoglobin: 11.9 g/dL — ABNORMAL LOW (ref 12.0–15.0)
POTASSIUM: 2.9 mmol/L — AB (ref 3.5–5.1)
Sodium: 143 mmol/L (ref 135–145)
TCO2: 27 mmol/L (ref 0–100)

## 2015-05-21 LAB — POTASSIUM: Potassium: 3 mmol/L — ABNORMAL LOW (ref 3.5–5.1)

## 2015-05-21 LAB — CREATININE, SERUM: CREATININE: 0.99 mg/dL (ref 0.44–1.00)

## 2015-05-21 LAB — MAGNESIUM: Magnesium: 1.9 mg/dL (ref 1.7–2.4)

## 2015-05-21 MED ORDER — LISINOPRIL 10 MG PO TABS
10.0000 mg | ORAL_TABLET | Freq: Every day | ORAL | Status: DC
  Administered 2015-05-21 – 2015-05-24 (×4): 10 mg via ORAL
  Filled 2015-05-21 (×4): qty 1

## 2015-05-21 MED ORDER — ALBUTEROL SULFATE (2.5 MG/3ML) 0.083% IN NEBU
2.5000 mg | INHALATION_SOLUTION | Freq: Four times a day (QID) | RESPIRATORY_TRACT | Status: DC
  Administered 2015-05-21 – 2015-05-22 (×5): 2.5 mg via RESPIRATORY_TRACT
  Filled 2015-05-21 (×5): qty 3

## 2015-05-21 MED ORDER — MAGNESIUM SULFATE 2 GM/50ML IV SOLN
2.0000 g | Freq: Once | INTRAVENOUS | Status: AC
  Administered 2015-05-21: 2 g via INTRAVENOUS
  Filled 2015-05-21: qty 50

## 2015-05-21 MED ORDER — ENOXAPARIN SODIUM 40 MG/0.4ML ~~LOC~~ SOLN
40.0000 mg | SUBCUTANEOUS | Status: DC
  Filled 2015-05-21 (×3): qty 0.4

## 2015-05-21 MED ORDER — POTASSIUM CHLORIDE CRYS ER 20 MEQ PO TBCR
60.0000 meq | EXTENDED_RELEASE_TABLET | Freq: Once | ORAL | Status: AC
  Administered 2015-05-21: 60 meq via ORAL
  Filled 2015-05-21: qty 3

## 2015-05-21 MED ORDER — METHYLPREDNISOLONE SODIUM SUCC 40 MG IJ SOLR
40.0000 mg | Freq: Four times a day (QID) | INTRAMUSCULAR | Status: DC
  Administered 2015-05-21 – 2015-05-24 (×13): 40 mg via INTRAVENOUS
  Filled 2015-05-21 (×13): qty 1

## 2015-05-21 MED ORDER — SODIUM CHLORIDE 0.9 % IV BOLUS (SEPSIS)
500.0000 mL | Freq: Once | INTRAVENOUS | Status: AC
  Administered 2015-05-21: 500 mL via INTRAVENOUS

## 2015-05-21 MED ORDER — DEXTROSE 5 % IV SOLN
500.0000 mg | INTRAVENOUS | Status: DC
  Administered 2015-05-22 – 2015-05-24 (×3): 500 mg via INTRAVENOUS
  Filled 2015-05-21 (×3): qty 500

## 2015-05-21 MED ORDER — GUAIFENESIN-CODEINE 100-10 MG/5ML PO SOLN
10.0000 mL | ORAL | Status: DC | PRN
  Administered 2015-05-21 – 2015-05-24 (×2): 10 mL via ORAL
  Filled 2015-05-21 (×3): qty 10

## 2015-05-21 MED ORDER — DEXTROSE 5 % IV SOLN
500.0000 mg | Freq: Once | INTRAVENOUS | Status: AC
  Administered 2015-05-21: 500 mg via INTRAVENOUS
  Filled 2015-05-21: qty 500

## 2015-05-21 MED ORDER — PRAVASTATIN SODIUM 40 MG PO TABS
80.0000 mg | ORAL_TABLET | Freq: Every day | ORAL | Status: DC
  Administered 2015-05-21 – 2015-05-23 (×3): 80 mg via ORAL
  Filled 2015-05-21 (×3): qty 2

## 2015-05-21 MED ORDER — FAMOTIDINE 20 MG PO TABS
40.0000 mg | ORAL_TABLET | Freq: Every day | ORAL | Status: DC
  Administered 2015-05-21 – 2015-05-24 (×4): 40 mg via ORAL
  Filled 2015-05-21 (×4): qty 2

## 2015-05-21 MED ORDER — ONDANSETRON HCL 4 MG/2ML IJ SOLN: 4.0000 mg | Freq: Four times a day (QID) | INTRAMUSCULAR | Status: DC | PRN

## 2015-05-21 MED ORDER — IPRATROPIUM BROMIDE 0.02 % IN SOLN
0.5000 mg | Freq: Once | RESPIRATORY_TRACT | Status: AC
  Administered 2015-05-21: 0.5 mg via RESPIRATORY_TRACT
  Filled 2015-05-21: qty 2.5

## 2015-05-21 MED ORDER — ABACAVIR-DOLUTEGRAVIR-LAMIVUD 600-50-300 MG PO TABS
1.0000 | ORAL_TABLET | Freq: Every day | ORAL | Status: DC
Start: 2015-05-21 — End: 2015-05-24
  Administered 2015-05-21 – 2015-05-23 (×3): 1 via ORAL
  Filled 2015-05-21 (×6): qty 1

## 2015-05-21 MED ORDER — METHYLPREDNISOLONE SODIUM SUCC 125 MG IJ SOLR
125.0000 mg | Freq: Once | INTRAMUSCULAR | Status: AC
  Administered 2015-05-21: 125 mg via INTRAVENOUS
  Filled 2015-05-21: qty 2

## 2015-05-21 MED ORDER — VALACYCLOVIR HCL 500 MG PO TABS
500.0000 mg | ORAL_TABLET | Freq: Three times a day (TID) | ORAL | Status: DC
  Administered 2015-05-21 – 2015-05-24 (×10): 500 mg via ORAL
  Filled 2015-05-21 (×12): qty 1

## 2015-05-21 MED ORDER — POTASSIUM CHLORIDE CRYS ER 20 MEQ PO TBCR
40.0000 meq | EXTENDED_RELEASE_TABLET | Freq: Every day | ORAL | Status: DC
  Administered 2015-05-22 – 2015-05-24 (×3): 40 meq via ORAL
  Filled 2015-05-21 (×5): qty 2

## 2015-05-21 MED ORDER — ALBUTEROL (5 MG/ML) CONTINUOUS INHALATION SOLN
10.0000 mg/h | INHALATION_SOLUTION | Freq: Once | RESPIRATORY_TRACT | Status: AC
  Administered 2015-05-21: 10 mg/h via RESPIRATORY_TRACT

## 2015-05-21 MED ORDER — KETOROLAC TROMETHAMINE 30 MG/ML IJ SOLN
30.0000 mg | Freq: Once | INTRAMUSCULAR | Status: AC
  Administered 2015-05-21: 30 mg via INTRAVENOUS
  Filled 2015-05-21: qty 1

## 2015-05-21 MED ORDER — ONDANSETRON HCL 4 MG PO TABS: 4.0000 mg | ORAL_TABLET | Freq: Four times a day (QID) | ORAL | Status: DC | PRN

## 2015-05-21 MED ORDER — TENOFOVIR DISOPROXIL FUMARATE 300 MG PO TABS
300.0000 mg | ORAL_TABLET | Freq: Every day | ORAL | Status: DC
  Administered 2015-05-21 – 2015-05-23 (×3): 300 mg via ORAL
  Filled 2015-05-21 (×5): qty 1

## 2015-05-21 MED ORDER — ALBUTEROL SULFATE (2.5 MG/3ML) 0.083% IN NEBU
2.5000 mg | INHALATION_SOLUTION | RESPIRATORY_TRACT | Status: DC | PRN
  Administered 2015-05-23: 2.5 mg via RESPIRATORY_TRACT
  Filled 2015-05-21: qty 3

## 2015-05-21 MED ORDER — ALBUTEROL SULFATE (2.5 MG/3ML) 0.083% IN NEBU
10.0000 mg | INHALATION_SOLUTION | Freq: Once | RESPIRATORY_TRACT | Status: DC
  Filled 2015-05-21: qty 12

## 2015-05-21 MED ORDER — ALBUTEROL SULFATE (2.5 MG/3ML) 0.083% IN NEBU
5.0000 mg | INHALATION_SOLUTION | Freq: Once | RESPIRATORY_TRACT | Status: AC
  Administered 2015-05-21: 5 mg via RESPIRATORY_TRACT
  Filled 2015-05-21: qty 6

## 2015-05-21 NOTE — H&P (Signed)
[facility] History and Physical  [name] [name] [id number] DOB: 11/07/1955    PCP:   No primary care provider on file.   Chief Complaint: SOB, coughs.   HPI: [name] [name] is an 60 y.o. female with hx of HIV under care of Dr Megan Salon, last CDcount 200's, hx of asthma, HTN, GERD, presented to the ER with 2 weeks Hx of coughing, wheezing, SOB, and malaise.  She denied CP, fever, chills, N/V, distant travel or ill contatct.  She was found to have wheezing in the ER, and K was slightly low at 3.0 mE/L.  CXR showed no infiltrate.  She was given K supplement, IV Zithromax, IV steroids and Nebs.   Persistent wheezing was found, so hospitalist was asked to admit her for asthma exacerbation and hypokelemia.   Rewiew of Systems:  Constitutional: Negative for malaise, fever and chills. No significant weight loss or weight gain Eyes: Negative for eye pain, redness and discharge, diplopia, visual changes, or flashes of light. ENMT: Negative for ear pain, hoarseness, nasal congestion, sinus pressure and sore throat. No headaches; tinnitus, drooling, or problem swallowing. Cardiovascular: Negative for chest pain, palpitations, diaphoresis,and peripheral edema. ; No orthopnea, PND Respiratory: Negative for hemoptysis, wheezing and stridor. No pleuritic chestpain. Gastrointestinal: Negative for nausea, vomiting, diarrhea, constipation, abdominal pain, melena, blood in stool, hematemesis, jaundice and rectal bleeding.    Genitourinary: Negative for frequency, dysuria, incontinence,flank pain and hematuria; Musculoskeletal: Negative for back pain and neck pain. Negative for swelling and trauma.;  Skin: . Negative for pruritus, rash, abrasions, bruising and skin lesion.; ulcerations Neuro: Negative for headache, lightheadedness and neck stiffness. Negative for weakness, altered level of consciousness , altered mental status, extremity weakness, burning feet, involuntary movement, seizure and  syncope.  Psych: negative for anxiety, depression, insomnia, tearfulness, panic attacks, hallucinations, paranoia, suicidal or homicidal ideation    Past Medical History  Diagnosis Date  . HIV (human immunodeficiency virus infection) ([place]) 2007  . Hypertension   . GERD (gastroesophageal reflux disease)     Past Surgical History  Procedure Laterality Date  . Ectopic pregnancy surgery      s/p right salpinooophrectomy  . Bunionectomy    . Uterine fibroid surgery      Medications:  HOME MEDS: Prior to Admission medications   Medication Sig Start Date End Date Taking? Authorizing Provider  Abacavir-Dolutegravir-Lamivud [id number] MG TABS Take 1 tablet by mouth daily. 10/10/14  Yes Thayer Headings, MD  benzonatate (TESSALON) 100 MG capsule Take 1 capsule (100 mg total) by mouth 3 (three) times daily as needed for cough. 05/19/15  Yes Sharlett Iles, MD  famotidine (PEPCID) 40 MG tablet TAKE 1 TABLET BY MOUTH EVERY DAY 01/30/15  Yes Michel Bickers, MD  Investigational - Study Medication Take 1 tablet by mouth daily. Additional Study Details: "Statin" Study Drug   Yes Historical Provider, MD  lisinopril (PRINIVIL,ZESTRIL) 10 MG tablet TAKE 1 TABLET BY MOUTH EVERY DAY 01/30/15  Yes Michel Bickers, MD  VIREAD 300 MG tablet Take 300 mg by mouth daily. 05/15/15  Yes Historical Provider, MD  Pitavastatin Calcium 4 MG TABS Take 1 tablet (4 mg total) by mouth daily. Patient not taking: Reported on 05/21/2015 10/18/13   Truman Hayward, MD  valACYclovir (VALTREX) 500 MG tablet TAKE 1 TABLET BY MOUTH THREE TIMES DAILY Patient not taking: Reported on 02/21/2015 07/25/14   Michel Bickers, MD     Allergies:  Allergies  Allergen Reactions  . Sulfa Antibiotics Other (See Comments)  Blood pressure drops.  . Penicillins Rash    Social History:   reports that she has never smoked. She has never used smokeless tobacco. She reports that she does not drink alcohol or use illicit drugs.  Family  History: Family History  Problem Relation Age of Onset  . Coronary artery disease Mother   . Hypertension Mother   . Anxiety disorder Mother   . Arthritis Mother     rheumatoid  . Cancer Father     possible prostate cancer  . Diabetes Sister      Physical Exam: Filed Vitals:   05/21/15 0715 05/21/15 0745 05/21/15 0800 05/21/15 0815  BP: 135/61 118/61 126/60 113/93  Pulse: 121 120 113 121  Temp:      TempSrc:      Resp: 22 23 22 19   Height:      Weight:      SpO2: 95% 100% 96% 88%   Blood pressure 113/93, pulse 121, temperature 99.5 F (37.5 C), temperature source Oral, resp. rate 19, height 5\' 1"  (1.549 m), weight 91.173 kg (201 lb), SpO2 88 %.  GEN:  Pleasant  patient lying in the stretcher in no acute distress; cooperative with exam. PSYCH:  alert and oriented x4; does not appear anxious or depressed; affect is appropriate. HEENT: Mucous membranes pink and anicteric; PERRLA; EOM intact; no cervical lymphadenopathy nor thyromegaly or carotid bruit; no JVD; There were no stridor. Neck is very supple. Breasts:: Not examined CHEST WALL: No tenderness CHEST: Both insp and expiratory wheezing found.  No rales.   HEART: Regular rate and rhythm.  There are no murmur, rub, or gallops.   BACK: No kyphosis or scoliosis; no CVA tenderness ABDOMEN: soft and non-tender; no masses, no organomegaly, normal abdominal bowel sounds; no pannus; no intertriginous candida. There is no rebound and no distention. Rectal Exam: Not done EXTREMITIES: No bone or joint deformity; age-appropriate arthropathy of the hands and knees; no edema; no ulcerations.  There is no calf tenderness. Genitalia: not examined PULSES: 2+ and symmetric SKIN: Normal hydration no rash or ulceration CNS: Cranial nerves 2-12 grossly intact no focal lateralizing neurologic deficit.  Speech is fluent; uvula elevated with phonation, facial symmetry and tongue midline. DTR are normal bilaterally, cerebella exam is intact,  barbinski is negative and strengths are equaled bilaterally.  No sensory loss.   Labs on Admission:  Basic Metabolic Panel:  Recent Labs Lab 05/19/15 0749 05/21/15 0301 05/21/15 0356  NA 143 143  --   K 3.4* 2.9* 3.0*  CL 107 102  --   CO2 24  --   --   GLUCOSE 101* 96  --   BUN <5* 5*  --   CREATININE 0.81 0.80  --   CALCIUM 8.9  --   --   MG  --   --  1.9   CBC:  Recent Labs Lab 05/19/15 0749 05/21/15 0155 05/21/15 0301  WBC 4.1 3.9*  --   NEUTROABS  --  2.2  --   HGB 11.8* 10.8* 11.9*  HCT 36.3 33.1* 35.0*  MCV 94.8 93.8  --   PLT 189 169  --    Radiological Exams on Admission: Dg Chest Portable 1 View  05/21/2015  CLINICAL DATA:  Wheezing.  Asthma. EXAM: PORTABLE CHEST 1 VIEW COMPARISON:  05/19/2015 FINDINGS: Prominent heart size attributed to technique. Stable aortic tortuosity. Negative hila. There is no edema, consolidation, effusion, or pneumothorax. IMPRESSION: Stable exam.  No acute finding. Electronically Signed   By:  Monte Fantasia M.D.   On: 05/21/2015 01:57    EKG: Independently reviewed.   Assessment/Plan Present on Admission:  . Status asthmaticus . Asthmatic bronchitis . Acid reflux . GERD . Human immunodeficiency virus (HIV) disease ([place]) . Hypokalemia  PLAN:  Asthmatic Bronchitits:  Will continue with IV steroid, nebs, and IV Zithromax.  Will give her some cough medicine.  Hypokalemia:  Normal Mag. Could be from albuterol Tx.  Will replete orally.  GERD:  Continue PPI, especially in setting of acute asthma exacerbation.  HIV:  Continue with HART meds.  I don't think this is PCP infection.  She is not hypoxic.   Other plans as per orders. Code Status: FULL Haskel Khan, MD. FACP [facility] Pager [phone] 7pm to 7am.  05/21/2015, 8:28 AM

## 2015-05-21 NOTE — Progress Notes (Signed)
New Admission Note:  Arrival Method: Ed stretcher Mental Orientation: alert and oriented Telemetry: none Assessment: Completed Skin: no issues IV: L ac Pain: none Tubes: none Safety Measures: Safety Fall Prevention Plan was given, discussed and signed. Admission: Completed [address] Orientation: Patient has been orientated to the room, unit and the staff. Family: not present  Orders have been reviewed and implemented. Will continue to monitor the patient. Call light has been placed within reach and bed alarm has been activated.   Fabian Sharp, RN  Phone Number: [phone]

## 2015-05-21 NOTE — ED Notes (Signed)
Reported heart rate in 130s to Dr. Randal Buba, MD acknowledges, order for 528mL for management.

## 2015-05-21 NOTE — ED Notes (Signed)
Admitting MD at the bedside.  

## 2015-05-21 NOTE — Progress Notes (Signed)
Attempted report. RN unavailable at this time.

## 2015-05-21 NOTE — ED Provider Notes (Addendum)
CSN: [id number]     Arrival date & time 05/20/15  1901 History  By signing my name below, I, Rowan Blase, attest that this documentation has been prepared under the direction and in the presence of Brittinie Wherley, MD . Electronically Signed: Rowan Blase, Scribe. 05/21/2015. 1:58 AM.   Chief Complaint  Patient presents with  . Cough   Patient is a 60 y.o. female presenting with wheezing. The history is provided by the patient. No language interpreter was used.  Wheezing Severity:  Severe Onset quality:  Gradual Timing:  Constant Progression:  Worsening Chronicity:  Recurrent Context: not animal exposure   Relieved by:  Nothing Worsened by:  Nothing tried Ineffective treatments: tessalon. Associated symptoms: cough   Associated symptoms: no fever and no rhinorrhea   Risk factors: no smoke inhalation    HPI Comments:  [name] [name] is a 59 y.o. female with PMHx of HIV and HTN who presents to the Emergency Department complaining of persistent, worsening productive cough for the past few days. Pt reports associated chills, generalized body aches, and wheezing. She reports some wheezing a few weeks ago. Pt was seen in the ED yesterday night for similar symptoms; she was treated with albuterol in the ED and given a tablet for cough. She states her symptoms have been worsening since discharge. Pt notes h/o ashtma as child. Pt denies h/o smoking or allergy to peanuts.   Past Medical History  Diagnosis Date  . HIV (human immunodeficiency virus infection) ([place]) 2007  . Hypertension   . GERD (gastroesophageal reflux disease)    Past Surgical History  Procedure Laterality Date  . Ectopic pregnancy surgery      s/p right salpinooophrectomy  . Bunionectomy    . Uterine fibroid surgery     Family History  Problem Relation Age of Onset  . Coronary artery disease Mother   . Hypertension Mother   . Anxiety disorder Mother   . Arthritis Mother     rheumatoid  . Cancer Father      possible prostate cancer  . Diabetes Sister    Social History  Substance Use Topics  . Smoking status: Never Smoker   . Smokeless tobacco: Never Used  . Alcohol Use: No   OB History    No data available     Review of Systems  Constitutional: Positive for chills. Negative for fever.  HENT: Negative for rhinorrhea.   Respiratory: Positive for cough and wheezing.   Musculoskeletal: Positive for myalgias.  All other systems reviewed and are negative.  Allergies  Sulfa antibiotics and Penicillins  Home Medications   Prior to Admission medications   Medication Sig Start Date End Date Taking? Authorizing Provider  Abacavir-Dolutegravir-Lamivud [id number] MG TABS Take 1 tablet by mouth daily. 10/10/14  Yes Thayer Headings, MD  benzonatate (TESSALON) 100 MG capsule Take 1 capsule (100 mg total) by mouth 3 (three) times daily as needed for cough. 05/19/15  Yes Sharlett Iles, MD  famotidine (PEPCID) 40 MG tablet TAKE 1 TABLET BY MOUTH EVERY DAY 01/30/15  Yes Michel Bickers, MD  Investigational - Study Medication Take 1 tablet by mouth daily. Additional Study Details: "Statin" Study Drug   Yes Historical Provider, MD  lisinopril (PRINIVIL,ZESTRIL) 10 MG tablet TAKE 1 TABLET BY MOUTH EVERY DAY 01/30/15  Yes Michel Bickers, MD  VIREAD 300 MG tablet Take 300 mg by mouth daily. 05/15/15  Yes Historical Provider, MD  Pitavastatin Calcium 4 MG TABS Take 1 tablet (4 mg total) by  mouth daily. Patient not taking: Reported on 05/21/2015 10/18/13   Truman Hayward, MD  valACYclovir (VALTREX) 500 MG tablet TAKE 1 TABLET BY MOUTH THREE TIMES DAILY Patient not taking: Reported on 02/21/2015 07/25/14   Michel Bickers, MD   BP 144/80 mmHg  Pulse 98  Temp(Src) 99.6 F (37.6 C) (Oral)  Resp 22  Ht 5\' 1"  (1.549 m)  Wt 201 lb (91.173 kg)  BMI 38.00 kg/m2  SpO2 96% Physical Exam  Constitutional: She is oriented to person, place, and time. She appears well-developed and well-nourished.  HENT:  Head:  Normocephalic and atraumatic.  Mouth/Throat: Oropharynx is clear and moist and mucous membranes are normal. No oropharyngeal exudate.  Eyes: Pupils are equal, round, and reactive to light.  Neck: Normal range of motion. Neck supple. Carotid bruit is not present.  Cardiovascular: Regular rhythm.  Tachycardia present.   Pulmonary/Chest: No stridor. She has wheezes. She has no rales.  diminished but wheezing apically  Abdominal: Soft. Bowel sounds are normal. She exhibits no mass. There is no tenderness. There is no rebound and no guarding.  Musculoskeletal: Normal range of motion. She exhibits no edema.  Neurological: She is alert and oriented to person, place, and time. She has normal reflexes.  Skin: Skin is warm and dry. No lesion noted. She is not diaphoretic.  Psychiatric: She has a normal mood and affect.   ED Course  Procedures  DIAGNOSTIC STUDIES:  Oxygen Saturation is 95% on RA, adequate by my interpretation.    COORDINATION OF CARE:  1:36 AM Will order nebulizer treatment and CBC. Discussed treatment plan with pt at bedside and pt agreed to plan.  Labs Review Labs Reviewed  CBC WITH DIFFERENTIAL/PLATELET - Abnormal; Notable for the following:    WBC 3.9 (*)    RBC 3.53 (*)    Hemoglobin 10.8 (*)    HCT 33.1 (*)    All other components within normal limits  I-STAT CHEM 8, ED - Abnormal; Notable for the following:    Potassium 2.9 (*)    BUN 5 (*)    Calcium, Ion 1.05 (*)    Hemoglobin 11.9 (*)    HCT 35.0 (*)    All other components within normal limits  POTASSIUM  MAGNESIUM    Imaging Review Dg Chest 2 View  05/19/2015  CLINICAL DATA:  Cough chest tightness for 1 day, history of HIV EXAM: CHEST  2 VIEW COMPARISON:  06/25/2012 FINDINGS: The heart size and mediastinal contours are within normal limits. Both lungs are clear. The visualized skeletal structures are unremarkable. IMPRESSION: No active cardiopulmonary disease. Electronically Signed   By: Skipper Cliche  M.D.   On: 05/19/2015 08:11   Dg Chest Portable 1 View  05/21/2015  CLINICAL DATA:  Wheezing.  Asthma. EXAM: PORTABLE CHEST 1 VIEW COMPARISON:  05/19/2015 FINDINGS: Prominent heart size attributed to technique. Stable aortic tortuosity. Negative hila. There is no edema, consolidation, effusion, or pneumothorax. IMPRESSION: Stable exam.  No acute finding. Electronically Signed   By: Monte Fantasia M.D.   On: 05/21/2015 01:57   I have personally reviewed and evaluated these images and lab results as part of my medical decision-making.   EKG Interpretation None      MDM   Final diagnoses:  None    Medications  albuterol (PROVENTIL) (2.5 MG/3ML) 0.083% nebulizer solution (  Not Given 05/20/15 1930)  albuterol (PROVENTIL) (2.5 MG/3ML) 0.083% nebulizer solution 10 mg (10 mg Nebulization Not Given 05/21/15 0523)  azithromycin (ZITHROMAX) 500  mg in dextrose 5 % 250 mL IVPB (500 mg Intravenous New Bag/Given 05/21/15 0616)  magnesium sulfate IVPB 2 g 50 mL (2 g Intravenous New Bag/Given 05/21/15 0617)  albuterol (PROVENTIL) (2.5 MG/3ML) 0.083% nebulizer solution 5 mg (5 mg Nebulization Given 05/20/15 1922)  albuterol (PROVENTIL) (2.5 MG/3ML) 0.083% nebulizer solution 5 mg (5 mg Nebulization Given 05/21/15 0355)  ipratropium (ATROVENT) nebulizer solution 0.5 mg (0.5 mg Nebulization Given 05/21/15 0354)  methylPREDNISolone sodium succinate (SOLU-MEDROL) 125 mg/2 mL injection 125 mg (125 mg Intravenous Given 05/21/15 0355)  potassium chloride SA (K-DUR,KLOR-CON) CR tablet 60 mEq (60 mEq Oral Given 05/21/15 0620)  albuterol (PROVENTIL,VENTOLIN) solution continuous neb (10 mg/hr Nebulization Given 05/21/15 0523)    Failed outpatient management is not breaking in the ED will need admit.  Per Dr. Arnoldo Morale inpt stepdown  I personally performed the services described in this documentation, which was scribed in my presence. The recorded information has been reviewed and is accurate.     Veatrice Kells,  MD 05/21/15 [id number]  Jamonica Schoff, MD 05/21/15 [id number]

## 2015-05-21 NOTE — ED Notes (Signed)
Regular diet tray ordered for breakfast. 

## 2015-05-22 DIAGNOSIS — I10 Essential (primary) hypertension: Secondary | ICD-10-CM

## 2015-05-22 DIAGNOSIS — J45901 Unspecified asthma with (acute) exacerbation: Secondary | ICD-10-CM

## 2015-05-22 LAB — COMPREHENSIVE METABOLIC PANEL
ALT: 29 U/L (ref 14–54)
AST: 29 U/L (ref 15–41)
Albumin: 3.1 g/dL — ABNORMAL LOW (ref 3.5–5.0)
Alkaline Phosphatase: 71 U/L (ref 38–126)
Anion gap: 9 (ref 5–15)
BUN: 7 mg/dL (ref 6–20)
CHLORIDE: 110 mmol/L (ref 101–111)
CO2: 25 mmol/L (ref 22–32)
CREATININE: 0.74 mg/dL (ref 0.44–1.00)
Calcium: 9 mg/dL (ref 8.9–10.3)
GFR calc Af Amer: >60 mL/min (ref >60)
GFR calc non Af Amer: >60 mL/min (ref >60)
Glucose, Bld: 144 mg/dL — ABNORMAL HIGH (ref 65–99)
POTASSIUM: 4.4 mmol/L (ref 3.5–5.1)
SODIUM: 144 mmol/L (ref 135–145)
Total Bilirubin: 0.4 mg/dL (ref 0.3–1.2)
Total Protein: 7 g/dL (ref 6.5–8.1)

## 2015-05-22 LAB — CBC
HEMATOCRIT: 35 % — AB (ref 36.0–46.0)
Hemoglobin: 11.2 g/dL — ABNORMAL LOW (ref 12.0–15.0)
MCH: 30.3 pg (ref 26.0–34.0)
MCHC: 32 g/dL (ref 30.0–36.0)
MCV: 94.6 fL (ref 78.0–100.0)
Platelets: 171 10*3/uL (ref 150–400)
RBC: 3.7 MIL/uL — AB (ref 3.87–5.11)
RDW: 13.5 % (ref 11.5–15.5)
WBC: 7 10*3/uL (ref 4.0–10.5)

## 2015-05-22 MED ORDER — ALBUTEROL SULFATE (2.5 MG/3ML) 0.083% IN NEBU
2.5000 mg | INHALATION_SOLUTION | Freq: Three times a day (TID) | RESPIRATORY_TRACT | Status: DC
  Filled 2015-05-22 (×2): qty 3

## 2015-05-22 NOTE — Progress Notes (Signed)
[facility] PROGRESS NOTE  [name] [name] [id number] DOB: 01/16/1956 DOA: 05/21/2015  PCP: No primary care provider on file.  Brief HPI: 60 year old African-American female with a past medical history of HIV with last CD4 count in the 200s on antiretroviral treatment, history of asthma, hypertension, GERD, presented with a two-week history of worsening cough, wheezing, shortness of breath. She was admitted for management of acute bronchitis.  Past medical history:  Past Medical History  Diagnosis Date  . HIV (human immunodeficiency virus infection) ([place]) 2007  . Hypertension   . GERD (gastroesophageal reflux disease)     Consultants: None  Procedures: None  Antibiotics: Azithromycin  Subjective: Patient feels slightly better this morning. However, she continues to have a cough with yellowish expectoration. Some wheezing is present. Shortness of breath mainly with exertion. Denies any chest pain. No nausea or vomiting.  Objective: Vital Signs  Filed Vitals:   05/21/15 1919 05/22/15 0513 05/22/15 0805 05/22/15 1142  BP:  155/89    Pulse:  98    Temp:  98.8 F (37.1 C)    TempSrc:  Oral    Resp:  16    Height:      Weight:      SpO2: 97% 96% 98% 96%    Intake/Output Summary (Last 24 hours) at 05/22/15 1221 Last data filed at 05/22/15 0523  Gross per 24 hour  Intake    480 ml  Output   1600 ml  Net  -1120 ml   Filed Weights   05/20/15 1914  Weight: 91.173 kg (201 lb)    General appearance: alert, cooperative, appears stated age and no distress Resp: End expiratory wheezing bilaterally. No definite crackles or rhonchi. Cardio: regular rate and rhythm, S1, S2 normal, no murmur, click, rub or gallop GI: soft, non-tender; bowel sounds normal; no masses,  no organomegaly Extremities: extremities normal, atraumatic, no cyanosis or edema Neurologic: Awake and alert. Oriented 3. No focal neurological deficits are present.  Lab Results:  Basic  Metabolic Panel:  Recent Labs Lab 05/19/15 0749 05/21/15 0301 05/21/15 0356 05/21/15 1159 05/22/15 0443  NA 143 143  --   --  144  K 3.4* 2.9* 3.0*  --  4.4  CL 107 102  --   --  110  CO2 24  --   --   --  25  GLUCOSE 101* 96  --   --  144*  BUN <5* 5*  --   --  7  CREATININE 0.81 0.80  --  0.99 0.74  CALCIUM 8.9  --   --   --  9.0  MG  --   --  1.9  --   --    Liver Function Tests:  Recent Labs Lab 05/22/15 0443  AST 29  ALT 29  ALKPHOS 71  BILITOT 0.4  PROT 7.0  ALBUMIN 3.1*   CBC:  Recent Labs Lab 05/19/15 0749 05/21/15 0155 05/21/15 0301 05/21/15 1159 05/22/15 0443  WBC 4.1 3.9*  --  2.9* 7.0  NEUTROABS  --  2.2  --   --   --   HGB 11.8* 10.8* 11.9* 10.6* 11.2*  HCT 36.3 33.1* 35.0* 33.4* 35.0*  MCV 94.8 93.8  --  95.2 94.6  PLT 189 169  --  169 171   BNP (last 3 results)  Recent Labs  05/19/15 0749  BNP 39.7     Studies/Results: Dg Chest Portable 1 View  05/21/2015  CLINICAL DATA:  Wheezing.  Asthma. EXAM:  PORTABLE CHEST 1 VIEW COMPARISON:  05/19/2015 FINDINGS: Prominent heart size attributed to technique. Stable aortic tortuosity. Negative hila. There is no edema, consolidation, effusion, or pneumothorax. IMPRESSION: Stable exam.  No acute finding. Electronically Signed   By: Monte Fantasia M.D.   On: 05/21/2015 01:57    Medications:  Scheduled: . abacavir-dolutegravir-lamiVUDine  1 tablet Oral Daily  . albuterol  2.5 mg Nebulization Q6H  . azithromycin  500 mg Intravenous Q24H  . enoxaparin (LOVENOX) injection  40 mg Subcutaneous Q24H  . famotidine  40 mg Oral Daily  . lisinopril  10 mg Oral Daily  . methylPREDNISolone (SOLU-MEDROL) injection  40 mg Intravenous Q6H  . potassium chloride  40 mEq Oral Daily  . pravastatin  80 mg Oral q1800  . tenofovir  300 mg Oral Daily  . valACYclovir  500 mg Oral TID   Continuous:  [id number], guaiFENesin-codeine, ondansetron **OR** ondansetron (ZOFRAN) IV  Assessment/Plan:  Principal  Problem:   Asthmatic bronchitis Active Problems:   Human immunodeficiency virus (HIV) disease (H[facility])   GERD   Acid reflux   Status asthmaticus   Hypokalemia    Acute bronchitis No infiltrates noted on chest x-ray. Patient feels slightly better this morning. She continues to wheeze. Continue with antibiotics, steroids as well as nebulizer treatment.  Hypokalemia Was repleted. Levels are normal this morning.  History of HIV Last CD4 count was 332 back in November. She is on antiretroviral treatments which will be continued.  History of essential hypertension. Monitor blood pressures closely. Continue lisinopril.  History of GERD. Pepcid  DVT Prophylaxis: Lovenox    Code Status: Full code  Family Communication: Discussed with the patient  Disposition Plan: Continue current management for now. Anticipate discharge in 1-2 days.    LOS: 1 day   [facility] Pager [phone] 05/22/2015, 12:21 PM  If 7PM-7AM, please contact night-coverage at [email or web address], password [facility]

## 2015-05-22 NOTE — Care Management Note (Signed)
Case Management Note  Patient Details  Name: [name] [name] MRN: [id number] Date of Birth: 1955-04-12  Subjective/Objective:                 Spoke with patient at the bedside. Independent patient from home, lives with her fiance. She states that she goes to Dr. Megan Salon at infectious [facility] (+042) for her primary care. 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 However the sup insurance not on file, CM LM with [employer]. Financial counselor to speak w/ patient about insurance and medicaid app. Patient states she receives her meds through the ADAP program. Patient denies any difficulties getting or paying for medications.    Action/Plan:  No CM needs identified at this time. Referral made to financial counselor.  Expected Discharge Date:                  Expected Discharge Plan:  Home/Self Care  In-House Referral:  Financial Counselor  Discharge planning Services  CM Consult  Post Acute Care Choice:  NA Choice offered to:     DME Arranged:    DME Agency:     HH Arranged:    [place] Agency:     Status of Service:  Completed, signed off  Medicare Important Message Given:    Date Medicare IM Given:    Medicare IM give by:    Date Additional Medicare IM Given:    Additional Medicare Important Message give by:     If discussed at [place] of Stay Meetings, dates discussed:    Additional Comments:  Carles Collet, RN 05/22/2015, 1:16 PM

## 2015-05-23 LAB — BASIC METABOLIC PANEL
Anion gap: 12 (ref 5–15)
BUN: 10 mg/dL (ref 6–20)
CHLORIDE: 105 mmol/L (ref 101–111)
CO2: 24 mmol/L (ref 22–32)
CREATININE: 0.75 mg/dL (ref 0.44–1.00)
Calcium: 9.1 mg/dL (ref 8.9–10.3)
GFR calc Af Amer: >60 mL/min (ref >60)
Glucose, Bld: 156 mg/dL — ABNORMAL HIGH (ref 65–99)
Potassium: 4 mmol/L (ref 3.5–5.1)
SODIUM: 141 mmol/L (ref 135–145)

## 2015-05-23 MED ORDER — ALBUTEROL SULFATE (2.5 MG/3ML) 0.083% IN NEBU
2.5000 mg | INHALATION_SOLUTION | Freq: Four times a day (QID) | RESPIRATORY_TRACT | Status: DC
Start: 2015-05-23 — End: 2015-05-24
  Administered 2015-05-23 – 2015-05-24 (×5): 2.5 mg via RESPIRATORY_TRACT
  Filled 2015-05-23 (×4): qty 3

## 2015-05-23 MED ORDER — BUDESONIDE 0.25 MG/2ML IN SUSP
0.2500 mg | Freq: Two times a day (BID) | RESPIRATORY_TRACT | Status: DC
  Administered 2015-05-23 – 2015-05-24 (×3): 0.25 mg via RESPIRATORY_TRACT
  Filled 2015-05-23 (×3): qty 2

## 2015-05-23 NOTE — Progress Notes (Signed)
[facility] PROGRESS NOTE  DANNIA EWIG [id number] DOB: 1955/11/08 DOA: 05/21/2015  PCP: No primary care provider on file.  Brief HPI: 60 year old African-American female with a past medical history of HIV with last CD4 count in the 200s on antiretroviral treatment, history of asthma, hypertension, GERD, presented with a two-week history of worsening cough, wheezing, shortness of breath. She was admitted for management of acute bronchitis.   Past medical history:  Past Medical History  Diagnosis Date  . HIV (human immunodeficiency virus infection) ([place]) 2007  . Hypertension   . GERD (gastroesophageal reflux disease)     Consultants: None  Procedures: None  Antibiotics: Azithromycin  Subjective: Patient states that she was feeling better till last night when her wheezing became worse. She started having multiple coughing spells. Still producing yellowish sputum at times, but mostly clear. Denies any chest pain.   Objective: Vital Signs  Filed Vitals:   05/22/15 1454 05/22/15 2106 05/22/15 2116 05/23/15 0511  BP: 148/87  160/83 145/99  Pulse: 106  106 99  Temp: 99.4 F (37.4 C)  98.1 F (36.7 C) 98.3 F (36.8 C)  TempSrc: Oral  Oral Oral  Resp: 16  18   Height:      Weight:      SpO2: 94% 96% 96% 95%    Intake/Output Summary (Last 24 hours) at 05/23/15 0900 Last data filed at 05/23/15 0514  Gross per 24 hour  Intake    690 ml  Output    650 ml  Net     40 ml   Filed Weights   05/20/15 1914  Weight: 91.173 kg (201 lb)    General appearance: alert, cooperative, appears stated age and no distress Resp: Diffuse End expiratory wheezing bilaterally. No definite crackles or rhonchi. Cardio: regular rate and rhythm, S1, S2 normal, no murmur, click, rub or gallop GI: soft, non-tender; bowel sounds normal; no masses,  no organomegaly Extremities: extremities normal, atraumatic, no cyanosis or edema Neurologic: Awake and alert. Oriented 3. No focal  neurological deficits are present.  Lab Results:  Basic Metabolic Panel:  Recent Labs Lab 05/19/15 0749 05/21/15 0301 05/21/15 0356 05/21/15 1159 05/22/15 0443 05/23/15 0517  NA 143 143  --   --  144 141  K 3.4* 2.9* 3.0*  --  4.4 4.0  CL 107 102  --   --  110 105  CO2 24  --   --   --  25 24  GLUCOSE 101* 96  --   --  144* 156*  BUN <5* 5*  --   --  7 10  CREATININE 0.81 0.80  --  0.99 0.74 0.75  CALCIUM 8.9  --   --   --  9.0 9.1  MG  --   --  1.9  --   --   --    Liver Function Tests:  Recent Labs Lab 05/22/15 0443  AST 29  ALT 29  ALKPHOS 71  BILITOT 0.4  PROT 7.0  ALBUMIN 3.1*   CBC:  Recent Labs Lab 05/19/15 0749 05/21/15 0155 05/21/15 0301 05/21/15 1159 05/22/15 0443  WBC 4.1 3.9*  --  2.9* 7.0  NEUTROABS  --  2.2  --   --   --   HGB 11.8* 10.8* 11.9* 10.6* 11.2*  HCT 36.3 33.1* 35.0* 33.4* 35.0*  MCV 94.8 93.8  --  95.2 94.6  PLT 189 169  --  169 171   BNP (last 3 results)  Recent Labs  05/19/15 0749  BNP 39.7     Studies/Results: No results found.  Medications:  Scheduled: . abacavir-dolutegravir-lamiVUDine  1 tablet Oral Daily  . albuterol  2.5 mg Nebulization TID  . azithromycin  500 mg Intravenous Q24H  . enoxaparin (LOVENOX) injection  40 mg Subcutaneous Q24H  . famotidine  40 mg Oral Daily  . lisinopril  10 mg Oral Daily  . methylPREDNISolone (SOLU-MEDROL) injection  40 mg Intravenous Q6H  . potassium chloride  40 mEq Oral Daily  . pravastatin  80 mg Oral q1800  . tenofovir  300 mg Oral Daily  . valACYclovir  500 mg Oral TID   Continuous:  [id number], guaiFENesin-codeine, ondansetron **OR** ondansetron (ZOFRAN) IV  Assessment/Plan:  Principal Problem:   Asthmatic bronchitis Active Problems:   Human immunodeficiency virus (HIV) disease (HCC)   GERD   Acid reflux   Status asthmaticus   Hypokalemia    Acute bronchitis No infiltrates noted on chest x-ray. Patient appears to have had a mild setback overnight.  She does have diffuse wheezing on examination today. She will need 1 more day of intensive nebulizer treatment and steroids. Continue with antibiotics, steroids as well as nebulizer treatment.  Hypokalemia Potassium level is corrected.  History of HIV Last CD4 count was 332 back in November. She is on antiretroviral and prophylactic treatments which will be continued.  History of essential hypertension. Monitor blood pressures closely. Continue lisinopril.  History of GERD. Pepcid  DVT Prophylaxis: Lovenox    Code Status: Full code  Family Communication: Discussed with the patient  Disposition Plan: Continue current management for one more day. Hopefully she will improve by tomorrow.     LOS: 2 days   [place] [facility] Pager [phone] 05/23/2015, 9:00 AM  If 7PM-7AM, please contact night-coverage at [email or web address], password [facility]

## 2015-05-24 ENCOUNTER — Inpatient Hospital Stay (HOSPITAL_COMMUNITY): Payer: Medicare Other

## 2015-05-24 DIAGNOSIS — Z21 Asymptomatic human immunodeficiency virus [HIV] infection status: Secondary | ICD-10-CM

## 2015-05-24 DIAGNOSIS — K219 Gastro-esophageal reflux disease without esophagitis: Secondary | ICD-10-CM

## 2015-05-24 DIAGNOSIS — J4520 Mild intermittent asthma, uncomplicated: Secondary | ICD-10-CM

## 2015-05-24 DIAGNOSIS — B20 Human immunodeficiency virus [HIV] disease: Secondary | ICD-10-CM | POA: Insufficient documentation

## 2015-05-24 DIAGNOSIS — R0600 Dyspnea, unspecified: Secondary | ICD-10-CM

## 2015-05-24 LAB — ECHOCARDIOGRAM COMPLETE

## 2015-05-24 MED ORDER — PREDNISONE 20 MG PO TABS: 40.0000 mg | ORAL_TABLET | Freq: Every day | ORAL | Status: DC

## 2015-05-24 MED ORDER — POTASSIUM CHLORIDE CRYS ER 20 MEQ PO TBCR: 40.0000 meq | EXTENDED_RELEASE_TABLET | Freq: Every day | ORAL | Status: DC

## 2015-05-24 MED ORDER — AZITHROMYCIN 500 MG PO TABS: 500.0000 mg | ORAL_TABLET | Freq: Every day | ORAL | Status: DC

## 2015-05-24 MED ORDER — ALBUTEROL SULFATE HFA 108 (90 BASE) MCG/ACT IN AERS: 2.0000 | INHALATION_SPRAY | Freq: Four times a day (QID) | RESPIRATORY_TRACT | Status: AC | PRN

## 2015-05-24 MED ORDER — MOMETASONE FURO-FORMOTEROL FUM 100-5 MCG/ACT IN AERO: 2.0000 | INHALATION_SPRAY | Freq: Two times a day (BID) | RESPIRATORY_TRACT | Status: AC

## 2015-05-24 NOTE — Progress Notes (Signed)
  Echocardiogram 2D Echocardiogram has been performed.  Darlina Sicilian M 05/24/2015, 2:22 PM

## 2015-05-24 NOTE — Care Management Note (Signed)
Case Management Note  Patient Details  Name: [name] [name] MRN: [id number] Date of Birth: August 26, 1955  Subjective/Objective:                  3-13-17Spoke with patient at the bedside. Independent patient from home, lives with her fiance. She states that she goes to Dr. Megan Salon at infectious disease (+042) for her primary care. 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 However the sup insurance not on file, CM LM with [employer]. Financial counselor to speak w/ patient about insurance and medicaid app. Patient states she receives her meds through the ADAP program. Patient denies any difficulties getting or paying for medications.   Action/Plan:  05-24-15. DC today. Spoke with Dr Megan Salon, he states he is willing to follow up with patient as PCP (and sometimes does with other patients) but clarified he does not have a lot of office time for appointments, if patient wiling to go to other clinic that would be ok. CM clarified with Dr. Megan Salon if [facility] would appropriate vs Internal medicine clinic. MD stated either is ok. CM spoke with patient about [place]. She agreed to go. UC making appointment in 3-5 days for apt and blood work. Patient received pamphlet from [place]. CM explained to patient that they may use the on site pharmacy to fill prescriptions given to them at discharge. Patient uses [facility] program. Expected Discharge Date:                  Expected Discharge Plan:  Home/Self Care  In-House Referral:  Financial Counselor  Discharge planning Services  CM Consult, [facility]  Post Acute Care Choice:  NA Choice offered to:     DME Arranged:    DME Agency:     HH Arranged:    [place] Agency:     Status of Service:  Completed, signed off  Medicare Important Message Given:  Yes Date Medicare IM Given:    Medicare IM give by:    Date Additional Medicare IM Given:    Additional Medicare Important Message give by:     If discussed at [place] of Stay Meetings, dates discussed:    Additional Comments:  Carles Collet, RN 05/24/2015, 2:26 PM

## 2015-05-24 NOTE — Discharge Summary (Addendum)
Physician Discharge Summary  JADIA CAPERS MRN: [id number] DOB/AGE: [birth year] 60 y.o.  PCP: No primary care provider on file.   Admit date: 05/21/2015 Discharge date: 05/24/2015  Discharge Diagnoses:     Principal Problem:   Asthmatic bronchitis Active Problems:   Human immunodeficiency virus (HIV) disease ([place])   GERD   Acid reflux   Status asthmaticus   Hypokalemia   HIV (human immunodeficiency virus infection) ([place])    Follow-up recommendations Follow-up with PCP in 3-5 days , including all  additional recommended appointments as below Follow-up CBC, CMP in 3-5 days please check results of ECHO on PCP follow up   Discharge Condition: Stable   Discharge Instructions    Current Discharge Medication List    START taking these medications   Details  albuterol (PROVENTIL HFA;VENTOLIN HFA) 108 (90 Base) MCG/ACT inhaler Inhale 2 puffs into the lungs every 6 (six) hours as needed for wheezing or shortness of breath. Qty: 1 Inhaler, Refills: 2    azithromycin (ZITHROMAX) 500 MG tablet Take 1 tablet (500 mg total) by mouth daily. Qty: 3 tablet, Refills: 0    mometasone-formoterol (DULERA) 100-5 MCG/ACT AERO Inhale 2 puffs into the lungs 2 (two) times daily. Qty: 1 Inhaler, Refills: 0    potassium chloride SA (K-DUR,KLOR-CON) 20 MEQ tablet Take 2 tablets (40 mEq total) by mouth daily. Qty: 20 tablet, Refills: 0    predniSONE (DELTASONE) 20 MG tablet Take 2 tablets (40 mg total) by mouth daily with breakfast. Qty: 10 tablet, Refills: 0      CONTINUE these medications which have NOT CHANGED   Details  Abacavir-Dolutegravir-Lamivud 600-50-300 MG TABS Take 1 tablet by mouth daily. Qty: 30 tablet, Refills: 11   Associated Diagnoses: Human immunodeficiency virus (HIV) disease ([facility])    benzonatate (TESSALON) 100 MG capsule Take 1 capsule (100 mg total) by mouth 3 (three) times daily as needed for cough. Qty: 15 capsule, Refills: 0    famotidine (PEPCID) 40 MG  tablet TAKE 1 TABLET BY MOUTH EVERY DAY Qty: 30 tablet, Refills: 4    Investigational - Study Medication Take 1 tablet by mouth daily. Additional Study Details: "Statin" Study Drug    lisinopril (PRINIVIL,ZESTRIL) 10 MG tablet TAKE 1 TABLET BY MOUTH EVERY DAY Qty: 30 tablet, Refills: 4    VIREAD 300 MG tablet Take 300 mg by mouth daily.    Pitavastatin Calcium 4 MG TABS Take 1 tablet (4 mg total) by mouth daily. Qty: 180 tablet, Refills: 0   Associated Diagnoses: Research study patient    valACYclovir (VALTREX) 500 MG tablet TAKE 1 TABLET BY MOUTH THREE TIMES DAILY Qty: 30 tablet, Refills: 5       Allergies  Allergen Reactions  . Sulfa Antibiotics Other (See Comments)    Blood pressure drops.  . Penicillins Rash      Disposition: 01-Home or Self Care   Consults:  None     Significant Diagnostic Studies:  Dg Chest 2 View  05/19/2015  CLINICAL DATA:  Cough chest tightness for 1 day, history of HIV EXAM: CHEST  2 VIEW COMPARISON:  06/25/2012 FINDINGS: The heart size and mediastinal contours are within normal limits. Both lungs are clear. The visualized skeletal structures are unremarkable. IMPRESSION: No active cardiopulmonary disease. Electronically Signed   By: Skipper Cliche M.D.   On: 05/19/2015 08:11   Dg Chest Portable 1 View  05/21/2015  CLINICAL DATA:  Wheezing.  Asthma. EXAM: PORTABLE CHEST 1 VIEW COMPARISON:  05/19/2015 FINDINGS: Prominent heart size attributed  to technique. Stable aortic tortuosity. Negative hila. There is no edema, consolidation, effusion, or pneumothorax. IMPRESSION: Stable exam.  No acute finding. Electronically Signed   By: Monte Fantasia M.D.   On: 05/21/2015 01:57        Filed Weights   05/20/15 1914  Weight: 91.173 kg (201 lb)     Microbiology: No results found for this or any previous visit (from the past 240 hour(s)).     Blood Culture    Component Value Date/Time   SDES URINE, CLEAN CATCH 02/10/2011 1411    SPECREQUEST NONE 02/10/2011 1411   CULT NO GROWTH 02/10/2011 1411   REPTSTATUS 02/11/2011 FINAL 02/10/2011 1411      Labs: Results for orders placed or performed during the hospital encounter of 05/21/15 (from the past 48 hour(s))  Basic metabolic panel     Status: Abnormal   Collection Time: 05/23/15  5:17 AM  Result Value Ref Range   Sodium 141 135 - 145 mmol/L   Potassium 4.0 3.5 - 5.1 mmol/L   Chloride 105 101 - 111 mmol/L   CO2 24 22 - 32 mmol/L   Glucose, Bld 156 (H) 65 - 99 mg/dL   BUN 10 6 - 20 mg/dL   Creatinine, Ser 0.75 0.44 - 1.00 mg/dL   Calcium 9.1 8.9 - 10.3 mg/dL   GFR calc non Af Amer >60 >60 mL/min   GFR calc Af Amer >60 >60 mL/min    Comment: (NOTE) The eGFR has been calculated using the CKD EPI equation. This calculation has not been validated in all clinical situations. eGFR's persistently <60 mL/min signify possible Chronic Kidney Disease.    Anion gap 12 5 - 15      Lab Results  Component Value Date   HGBA1C 5.8* 02/01/2015   HGBA1C 5.6 01/13/2014   HGBA1C 5.9* 06/15/2013     Lab Results  Component Value Date   LDLCALC 111 02/01/2015   CREATININE 0.75 05/23/2015     HPI :[name] [name] is an 60 y.o. female with hx of HIV under care of Dr Megan Salon, last CDcount 200's, hx of asthma, HTN, GERD, presented to the ER with 2 weeks Hx of coughing, wheezing, SOB, and malaise. She denied CP, fever, chills, N/V, distant travel or ill contatct. She was found to have wheezing in the ER, and K was slightly low at 3.0 mE/L. CXR showed no infiltrate. She was given K supplement, IV Zithromax, IV steroids and Nebs. Persistent wheezing was found, so hospitalist was asked to admit her for asthma exacerbation and hypokelemia.    HOSPITAL COURSE:   Acute bronchitis No infiltrates noted on chest x-ray. Patient received intensive nebulizer treatment, antibiotic and steroids. Continue with antibiotics 3 days, steroids and 5 days , also started on albuterol  rescue inhaler as well as Dulera Patient also complains of significant orthopnea for the last couple of weeks. 2-D echo ordered and results pending at this time PCP requested to follow-up on the results of the 2-D echo  Hypokalemia Potassium level is corrected. Recheck in the outpatient setting  History of HIV Last CD4 count was 332 back in November. She is on antiretroviral and prophylactic treatments which will be continued.  History of essential hypertension. Monitor blood pressures closely. Continue lisinopril.  History of GERD. Pepcid   Discharge Exam:    Blood pressure 164/97, pulse 100, temperature 98.2 F (36.8 C), temperature source Oral, resp. rate 16, height '5\' 1"'  (1.549 m), weight 91.173 kg (201 lb), SpO2 98 %.  CHEST: Both insp and expiratory wheezing found. No rales.  HEART: Regular rate and rhythm. There are no murmur, rub, or gallops.  BACK: No kyphosis or scoliosis; no CVA tenderness ABDOMEN: soft and non-tender; no masses, no organomegaly, normal abdominal bowel sounds; no pannus; no intertriginous candida. There is no rebound and no distention. Rectal Exam: Not done EXTREMITIES: No bone or joint deformity; age-appropriate arthropathy of the hands and knees; no edema; no ulcerations. There is no calf tenderness.        Follow-up Information    Follow up with PCP. Schedule an appointment as soon as possible for a visit in 3 days.   Why:  Hospital follow-up      Signed: Reyne Dumas 05/24/2015, 7:51 AM        Time spent >45 mins

## 2015-05-24 NOTE — Care Management Important Message (Signed)
Important Message  Patient Details  Name: [name] [name] MRN: [id number] Date of Birth: 1955-10-16   Medicare Important Message Given:  Yes    Nathen May 05/24/2015, 9:55 AM

## 2015-05-24 NOTE — Progress Notes (Signed)
PT Cancellation Note  Patient Details Name: [name] [name] MRN: [id number] DOB: 1956/01/04   Cancelled Treatment:    Reason Eval/Treat Not Completed: PT screened, no needs identified, will sign off [name] No PT needs identified. Thank you for this referral. PT signing-off at this time.  Ellouise Newer 05/24/2015, 3:52 PM  Camille Bal [place], [place]

## 2015-05-24 NOTE — Progress Notes (Signed)
Waiting for ride 

## 2015-05-24 NOTE — Progress Notes (Signed)
D/c to home via w/c all d/c instructions and belongings given voices no c/o

## 2015-06-02 ENCOUNTER — Ambulatory Visit: Payer: Self-pay | Attending: Physician Assistant | Admitting: Physician Assistant

## 2015-06-02 ENCOUNTER — Encounter: Payer: Self-pay | Admitting: Physician Assistant

## 2015-06-02 VITALS — BP 139/80 | HR 93 | Temp 98.1°F | Resp 15 | Ht 61.0 in | Wt 198.4 lb

## 2015-06-02 DIAGNOSIS — B20 Human immunodeficiency virus [HIV] disease: Secondary | ICD-10-CM | POA: Insufficient documentation

## 2015-06-02 DIAGNOSIS — J4520 Mild intermittent asthma, uncomplicated: Secondary | ICD-10-CM

## 2015-06-02 DIAGNOSIS — Z880 Allergy status to penicillin: Secondary | ICD-10-CM | POA: Insufficient documentation

## 2015-06-02 DIAGNOSIS — D649 Anemia, unspecified: Secondary | ICD-10-CM

## 2015-06-02 DIAGNOSIS — Z888 Allergy status to other drugs, medicaments and biological substances status: Secondary | ICD-10-CM | POA: Insufficient documentation

## 2015-06-02 DIAGNOSIS — K219 Gastro-esophageal reflux disease without esophagitis: Secondary | ICD-10-CM | POA: Insufficient documentation

## 2015-06-02 DIAGNOSIS — E876 Hypokalemia: Secondary | ICD-10-CM | POA: Insufficient documentation

## 2015-06-02 DIAGNOSIS — R739 Hyperglycemia, unspecified: Secondary | ICD-10-CM | POA: Insufficient documentation

## 2015-06-02 DIAGNOSIS — J45909 Unspecified asthma, uncomplicated: Secondary | ICD-10-CM | POA: Insufficient documentation

## 2015-06-02 DIAGNOSIS — Z79899 Other long term (current) drug therapy: Secondary | ICD-10-CM | POA: Insufficient documentation

## 2015-06-02 DIAGNOSIS — I10 Essential (primary) hypertension: Secondary | ICD-10-CM | POA: Insufficient documentation

## 2015-06-02 LAB — BASIC METABOLIC PANEL
BUN: 13 mg/dL (ref 7–25)
CALCIUM: 9.4 mg/dL (ref 8.6–10.4)
CHLORIDE: 106 mmol/L (ref 98–110)
CO2: 26 mmol/L (ref 20–31)
CREATININE: 0.97 mg/dL (ref 0.50–0.99)
Glucose, Bld: 91 mg/dL (ref 65–99)
Potassium: 3.7 mmol/L (ref 3.5–5.3)
Sodium: 142 mmol/L (ref 135–146)

## 2015-06-02 LAB — CBC WITH DIFFERENTIAL/PLATELET
BASOS ABS: 0 10*3/uL (ref 0.0–0.1)
Basophils Relative: 0 % (ref 0–1)
EOS ABS: 0.1 10*3/uL (ref 0.0–0.7)
EOS PCT: 2 % (ref 0–5)
HCT: 38.5 % (ref 36.0–46.0)
Hemoglobin: 13 g/dL (ref 12.0–15.0)
LYMPHS ABS: 1.4 10*3/uL (ref 0.7–4.0)
LYMPHS PCT: 31 % (ref 12–46)
MCH: 31.6 pg (ref 26.0–34.0)
MCHC: 33.8 g/dL (ref 30.0–36.0)
MCV: 93.7 fL (ref 78.0–100.0)
MONO ABS: 0.6 10*3/uL (ref 0.1–1.0)
MONOS PCT: 13 % — AB (ref 3–12)
MPV: 10.3 fL (ref 8.6–12.4)
Neutro Abs: 2.4 10*3/uL (ref 1.7–7.7)
Neutrophils Relative %: 54 % (ref 43–77)
PLATELETS: 228 10*3/uL (ref 150–400)
RBC: 4.11 MIL/uL (ref 3.87–5.11)
RDW: 13.5 % (ref 11.5–15.5)
WBC: 4.4 10*3/uL (ref 4.0–10.5)

## 2015-06-02 NOTE — Patient Instructions (Signed)
Asthma, Adult Asthma is a recurring condition in which the airways tighten and narrow. Asthma can make it difficult to breathe. It can cause coughing, wheezing, and shortness of breath. Asthma episodes, also called asthma attacks, range from minor to life-threatening. Asthma cannot be cured, but medicines and lifestyle changes can help control it. CAUSES Asthma is believed to be caused by inherited (genetic) and environmental factors, but its exact cause is unknown. Asthma may be triggered by allergens, lung infections, or irritants in the air. Asthma triggers are different for each person. Common triggers include:   Animal dander.  Dust mites.  Cockroaches.  Pollen from trees or grass.  Mold.  Smoke.  Air pollutants such as dust, household cleaners, hair sprays, aerosol sprays, paint fumes, strong chemicals, or strong odors.  Cold air, weather changes, and winds (which increase molds and pollens in the air).  Strong emotional expressions such as crying or laughing hard.  Stress.  Certain medicines (such as aspirin) or types of drugs (such as beta-blockers).  Sulfites in foods and drinks. Foods and drinks that may contain sulfites include dried fruit, potato chips, and sparkling grape juice.  Infections or inflammatory conditions such as the flu, a cold, or an inflammation of the nasal membranes (rhinitis).  Gastroesophageal reflux disease (GERD).  Exercise or strenuous activity. SYMPTOMS Symptoms may occur immediately after asthma is triggered or many hours later. Symptoms include:  Wheezing.  Excessive nighttime or early morning coughing.  Frequent or severe coughing with a common cold.  Chest tightness.  Shortness of breath. DIAGNOSIS  The diagnosis of asthma is made by a review of your medical history and a physical exam. Tests may also be performed. These may include:  Lung function studies. These tests show how much air you breathe in and out.  Allergy  tests.  Imaging tests such as X-rays. TREATMENT  Asthma cannot be cured, but it can usually be controlled. Treatment involves identifying and avoiding your asthma triggers. It also involves medicines. There are 2 classes of medicine used for asthma treatment:   Controller medicines. These prevent asthma symptoms from occurring. They are usually taken every day.  Reliever or rescue medicines. These quickly relieve asthma symptoms. They are used as needed and provide short-term relief. Your health care provider will help you create an asthma action plan. An asthma action plan is a written plan for managing and treating your asthma attacks. It includes a list of your asthma triggers and how they may be avoided. It also includes information on when medicines should be taken and when their dosage should be changed. An action plan may also involve the use of a device called a peak flow meter. A peak flow meter measures how well the lungs are working. It helps you monitor your condition. HOME CARE INSTRUCTIONS   Take medicines only as directed by your health care provider. Speak with your health care provider if you have questions about how or when to take the medicines.  Use a peak flow meter as directed by your health care provider. Record and keep track of readings.  Understand and use the action plan to help minimize or stop an asthma attack without needing to seek medical care.  Control your home environment in the following ways to help prevent asthma attacks:  Do not smoke. Avoid being exposed to secondhand smoke.  Change your heating and air conditioning filter regularly.  Limit your use of fireplaces and wood stoves.  Get rid of pests (such as roaches   and mice) and their droppings.  Throw away plants if you see mold on them.  Clean your floors and dust regularly. Use unscented cleaning products.  Try to have someone else vacuum for you regularly. Stay out of rooms while they are  being vacuumed and for a short while afterward. If you vacuum, use a dust mask from a hardware store, a double-layered or microfilter vacuum cleaner bag, or a vacuum cleaner with a HEPA filter.  Replace carpet with wood, tile, or vinyl flooring. Carpet can trap dander and dust.  Use allergy-proof pillows, mattress covers, and box spring covers.  Wash bed sheets and blankets every week in hot water and dry them in a dryer.  Use blankets that are made of polyester or cotton.  Clean bathrooms and kitchens with bleach. If possible, have someone repaint the walls in these rooms with mold-resistant paint. Keep out of the rooms that are being cleaned and painted.  Wash hands frequently. SEEK MEDICAL CARE IF:   You have wheezing, shortness of breath, or a cough even if taking medicine to prevent attacks.  The colored mucus you cough up (sputum) is thicker than usual.  Your sputum changes from clear or white to yellow, green, gray, or bloody.  You have any problems that may be related to the medicines you are taking (such as a rash, itching, swelling, or trouble breathing).  You are using a reliever medicine more than 2-3 times per week.  Your peak flow is still at 50-79% of your personal best after following your action plan for 1 hour.  You have a fever. SEEK IMMEDIATE MEDICAL CARE IF:   You seem to be getting worse and are unresponsive to treatment during an asthma attack.  You are short of breath even at rest.  You get short of breath when doing very little physical activity.  You have difficulty eating, drinking, or talking due to asthma symptoms.  You develop chest pain.  You develop a fast heartbeat.  You have a bluish color to your lips or fingernails.  You are light-headed, dizzy, or faint.  Your peak flow is less than 50% of your personal best.   This information is not intended to replace advice given to you by your health care provider. Make sure you discuss any  questions you have with your health care provider.   Document Released: 02/25/2005 Document Revised: 11/16/2014 Document Reviewed: 09/24/2012 Elsevier Interactive Patient Education 2016 [employer]  

## 2015-06-02 NOTE — Progress Notes (Signed)
Follow up on bronchitis and asthma Reports feeling better Not taking K pills (she says they are too big to swallow)

## 2015-06-02 NOTE — Progress Notes (Signed)
[name] [name], is a 60 y.o. female  [id number]  [id number]  DOB - 09-26-1955  Chief Complaint  Patient presents with  . Hospitalization Follow-up        Subjective:   [name] [name] is a 59 y.o. female here today to establish care and for a follow up visit from a recent hospitalization for asthmatic bronchitis.  She is HIV + with CD4 counts above 200 and sees Dr. Megan Salon (ID)on a regular basis.  She has a f/up appt with Dr. Megan Salon on 05/30/2015.  She was found to be hypokalemic in the hospital and was put on K+ supplements, but she has not been taking these.  She denies any new problems or complaints today.   She also had an echo done due to some recent orthopnea.  Results: Overall left ventricular systolic   function was normal. There were    no left ventricular regional wall motion abnormalities.    Borderline septal hypertrophy. - The left atrium was mildly dilated.  She feels the orthopnea has resolved since discharge from the hospital.   Reviewed hospital notes/ labs and recent A1C.   Patient has No headache, No chest pain, no palpitations. No abdominal pain - No Nausea, No new weakness tingling or numbness, No Cough - SOB or wheezing currently.   Problem  Essential Hypertension   Qualifier: Diagnosis of  By: Megan Salon MD, John       ALLERGIES: Allergies  Allergen Reactions  . Sulfa Antibiotics Other (See Comments)    Blood pressure drops.  . Penicillins Rash    PAST MEDICAL HISTORY: Past Medical History  Diagnosis Date  . HIV (human immunodeficiency virus infection) ([place]) 2007  . Hypertension   . GERD (gastroesophageal reflux disease)     MEDICATIONS AT HOME: Prior to Admission medications   Medication Sig Start Date End Date Taking? Authorizing Provider  Abacavir-Dolutegravir-Lamivud [id number] MG TABS Take 1 tablet by mouth daily. 10/10/14  Yes Thayer Headings, MD  albuterol (PROVENTIL HFA;VENTOLIN HFA) 108 (90 Base) MCG/ACT inhaler  Inhale 2 puffs into the lungs every 6 (six) hours as needed for wheezing or shortness of breath. 05/24/15  Yes Reyne Dumas, MD  azithromycin (ZITHROMAX) 500 MG tablet Take 1 tablet (500 mg total) by mouth daily. 05/24/15  Yes Reyne Dumas, MD  famotidine (PEPCID) 40 MG tablet TAKE 1 TABLET BY MOUTH EVERY DAY 01/30/15  Yes Michel Bickers, MD  Investigational - Study Medication Take 1 tablet by mouth daily. Reported on 06/02/2015   Yes Historical Provider, MD  lisinopril (PRINIVIL,ZESTRIL) 10 MG tablet TAKE 1 TABLET BY MOUTH EVERY DAY 01/30/15  Yes Michel Bickers, MD  mometasone-formoterol (DULERA) 100-5 MCG/ACT AERO Inhale 2 puffs into the lungs 2 (two) times daily. 05/24/15  Yes Reyne Dumas, MD  Pitavastatin Calcium 4 MG TABS Take 1 tablet (4 mg total) by mouth daily. 10/18/13  Yes Truman Hayward, MD  valACYclovir (VALTREX) 500 MG tablet TAKE 1 TABLET BY MOUTH THREE TIMES DAILY 07/25/14  Yes Michel Bickers, MD  VIREAD 300 MG tablet Take 300 mg by mouth daily. 05/15/15  Yes Historical Provider, MD     Objective:   Filed Vitals:   06/02/15 1105  BP: 139/80  Pulse: 93  Temp: 98.1 F (36.7 C)  Resp: 15  Height: 5\' 1"  (1.549 m)  Weight: 198 lb 6.4 oz (89.994 kg)  SpO2: 98%    Exam General appearance : Awake, alert, not in any distress. Speech Clear. Not toxic looking HEENT: Atraumatic and  Normocephalic, pupils equally reactive to light and accomodation Neck: supple, no JVD. No cervical lymphadenopathy.  Chest:Good air entry bilaterally, no added sounds.  Minimal wheeze RUL posteriorly.  Otherwise clear.   CVS: S1 S2 regular, no murmurs.  Extremities: B/L Lower Ext shows no edema, both legs are warm to touch Neurology: Awake alert, and oriented X 3, CN II-XII intact, Non focal Skin:No Rash  Data Review Lab Results  Component Value Date   HGBA1C 5.8* 02/01/2015   HGBA1C 5.6 01/13/2014   HGBA1C 5.9* 06/15/2013     Assessment & Plan   1. Asthmatic bronchitis, mild intermittent,  uncomplicated Continue inhalers  2. Hypokalemia She is not taking the K+.  WE will assess the need for further replacement - Basic metabolic panel  3. Essential hypertension Sub optimal control; Advised improved compliance with meds and check outside of office and bring in to next visit.  - Basic metabolic panel  4. Human immunodeficiency virus (HIV) disease ([place]) Continue f/up with Dr. Megan Salon - CBC with Differential/Platelet  5.  Hyperglycemia With normal A1C.  Counseled to decrease sugar/carb intake.  Will continue to monitor.  She drinks a lot of sodas and juice and has agreed to cut these out and replace with water.   Patient have been counseled extensively about nutrition and exercise.  She is agreeing to work on these.   Return in about 3 months (around 09/02/2015).  The patient was given clear instructions to go to ER or return to medical center if symptoms don't improve, worsen or new problems develop. The patient verbalized understanding. The patient was told to call to get lab results if they haven't heard anything in the next week.     Freeman Caldron, PA-C [facility] [place], [place]   06/02/2015, 1:41 PMsub

## 2015-06-07 ENCOUNTER — Telehealth: Payer: Self-pay

## 2015-06-07 ENCOUNTER — Encounter: Payer: Self-pay | Admitting: Internal Medicine

## 2015-06-07 ENCOUNTER — Ambulatory Visit (INDEPENDENT_AMBULATORY_CARE_PROVIDER_SITE_OTHER): Payer: Self-pay | Admitting: Internal Medicine

## 2015-06-07 VITALS — BP 118/81 | HR 98 | Temp 98.1°F | Wt 197.0 lb

## 2015-06-07 DIAGNOSIS — K2100 Gastro-esophageal reflux disease with esophagitis, without bleeding: Secondary | ICD-10-CM

## 2015-06-07 DIAGNOSIS — R1310 Dysphagia, unspecified: Secondary | ICD-10-CM

## 2015-06-07 DIAGNOSIS — B20 Human immunodeficiency virus [HIV] disease: Secondary | ICD-10-CM

## 2015-06-07 MED ORDER — OMEPRAZOLE 20 MG PO CPDR: 20.0000 mg | DELAYED_RELEASE_CAPSULE | Freq: Every day | ORAL | Status: AC

## 2015-06-07 NOTE — Assessment & Plan Note (Signed)
Her acid reflux is flaring up and she is having recurrent dysphagia with solid food. I will change the famotidine to omeprazole and see her back in one month.

## 2015-06-07 NOTE — Telephone Encounter (Signed)
-----   Message from Argentina Donovan, [place] sent at 06/06/2015  4:30 PM EDT ----- Please call Ms. Myre- her blood work is looking much improved since her hospitalization.

## 2015-06-07 NOTE — Telephone Encounter (Deleted)
-----   Message from [place] Donovan, [place] sent at 06/06/2015  4:30 PM EDT ----- Please call [name] [name]- her blood work is looking much improved since her hospitalization.

## 2015-06-07 NOTE — Assessment & Plan Note (Signed)
Her HIV infection remains under excellent control. She has had consistent viral suppression. I will have her continue her current antiretroviral regimen.

## 2015-06-07 NOTE — Assessment & Plan Note (Signed)
Her asthmatic bronchitis has improved. I asked her to use her Dulera inhaler on a daily basis and reserve the albuterol as her rescue inhaler. We will help her arrange a new primary care provider.

## 2015-06-07 NOTE — Progress Notes (Signed)
Patient Active Problem List   Diagnosis Date Noted  . Human immunodeficiency virus (HIV) disease ([place]) 08/12/2006    Priority: High  . Dysphagia 06/07/2015    Priority: Medium  . Dyslipidemia 03/30/2013    Priority: Medium  . Essential hypertension 03/29/2009    Priority: Medium  . GERD 01/13/2006    Priority: Medium  . HIV (human immunodeficiency virus infection) ([place])   . Status asthmaticus 05/21/2015  . Asthmatic bronchitis 05/21/2015  . Hypokalemia 05/21/2015  . Left foot pain 01/18/2014  . Rectal bleeding 06/26/2010  . LOW BACK PAIN, ACUTE 01/16/2010  . LESION, VAGINA 10/05/2009  . PAPANICOLAOU SMEAR OF VAGINA WITH LGSIL 01/25/2009  . VIRAL MENINGITIS 09/14/2008  . DEPRESSION 03/22/2008  . DENTAL CARIES 09/21/2007  . GROSS HEMATURIA 08/26/2007  . DEGENERATIVE JOINT DISEASE 08/12/2006  . GENITAL HERPES 01/13/2006  . ANEMIA 01/13/2006  . DEPENDENCE, COCAINE, UNSPECIFIED 01/13/2006  . ALLERGIC RHINITIS 01/13/2006  . PREGNANCY, ECTOPIC NOS W/O INTRAUTERINE PRG 01/13/2006  . ABORTION NOS W/PELVIC DAMAGE, UNSPECIFIED 01/13/2006  . ANGIOEDEMA 01/13/2006  . BUNIONECTOMY, HX OF 01/13/2006    Patient's Medications  New Prescriptions   OMEPRAZOLE (PRILOSEC) 20 MG CAPSULE    Take 1 capsule (20 mg total) by mouth daily.  Previous Medications   ABACAVIR-DOLUTEGRAVIR-LAMIVUD 600-50-300 MG TABS    Take 1 tablet by mouth daily.   ALBUTEROL (PROVENTIL HFA;VENTOLIN HFA) 108 (90 BASE) MCG/ACT INHALER    Inhale 2 puffs into the lungs every 6 (six) hours as needed for wheezing or shortness of breath.   AZITHROMYCIN (ZITHROMAX) 500 MG TABLET    Take 1 tablet (500 mg total) by mouth daily.   INVESTIGATIONAL - STUDY MEDICATION    Take 1 tablet by mouth daily. Reported on 06/02/2015   LISINOPRIL (PRINIVIL,ZESTRIL) 10 MG TABLET    TAKE 1 TABLET BY MOUTH EVERY DAY   MOMETASONE-FORMOTEROL (DULERA) 100-5 MCG/ACT AERO    Inhale 2 puffs into the lungs 2 (two) times daily.   PITAVASTATIN CALCIUM 4 MG TABS    Take 1 tablet (4 mg total) by mouth daily.   VALACYCLOVIR (VALTREX) 500 MG TABLET    TAKE 1 TABLET BY MOUTH THREE TIMES DAILY   VIREAD 300 MG TABLET    Take 300 mg by mouth daily.  Modified Medications   No medications on file  Discontinued Medications   FAMOTIDINE (PEPCID) 40 MG TABLET    TAKE 1 TABLET BY MOUTH EVERY DAY    Subjective: [name] [name] is in for her routine HIV follow-up visit. She was recently hospitalized with asthmatic bronchitis. She is feeling better now. She states that she is only needed to use her albuterol inhaler 2 or 3 times in the past few weeks. She tells me that she is only using her [facility] inhaler as needed. She has gained weight recently and has noted that she is having more problems with acid reflux. She continues to take famotidine on a daily basis. She used to take Prilosec but stopped taking that when her symptoms improved. She had esophageal dilatation many years ago before moving to [facility] because of dysphagia to solids. She has missed 3 or 4 doses of her Triumeq and Viread in the past month. This is generally occurred when she has been away from home in the evening when she normally takes her medications between 8 and 9 PM. She was not sure if it was okay to take her medications a little bit late when she  returned home. She does not have a primary care provider.  Review of Systems: Review of Systems  Constitutional: Negative for fever, chills, weight loss, malaise/fatigue and diaphoresis.  HENT: Negative for sore throat.   Respiratory: Positive for wheezing. Negative for cough, sputum production and shortness of breath.   Cardiovascular: Negative for chest pain.  Gastrointestinal: Positive for heartburn. Negative for nausea, vomiting, abdominal pain and diarrhea.  Genitourinary: Negative for dysuria and frequency.  Musculoskeletal: Negative for myalgias and joint pain.  Skin: Negative for itching and rash.  Neurological:  Negative for dizziness and headaches.  Psychiatric/Behavioral: Negative for depression and substance abuse. The patient is not nervous/anxious.     Past Medical History  Diagnosis Date  . HIV (human immunodeficiency virus infection) ([place]) 2007  . Hypertension   . GERD (gastroesophageal reflux disease)     Social History  Substance Use Topics  . Smoking status: Never Smoker   . Smokeless tobacco: Never Used  . Alcohol Use: No    Family History  Problem Relation Age of Onset  . Coronary artery disease Mother   . Hypertension Mother   . Anxiety disorder Mother   . Arthritis Mother     rheumatoid  . Cancer Father     possible prostate cancer  . Diabetes Sister     Allergies  Allergen Reactions  . Sulfa Antibiotics Other (See Comments)    Blood pressure drops.  . Penicillins Rash    Objective:  Filed Vitals:   06/07/15 1418  BP: 118/81  Pulse: 98  Temp: 98.1 F (36.7 C)  TempSrc: Oral  Weight: 197 lb (89.359 kg)   Body mass index is 37.24 kg/(m^2).  Physical Exam  Constitutional: She is oriented to person, place, and time.  She is in good spirits. She is accompanied by her boyfriend.  HENT:  Mouth/Throat: No oropharyngeal exudate.  Eyes: Conjunctivae are normal.  Cardiovascular: Normal rate and regular rhythm.   No murmur heard. Pulmonary/Chest: Effort normal and breath sounds normal. She has no wheezes. She has no rales.  Abdominal: Soft. She exhibits no mass. There is no tenderness.  Musculoskeletal: Normal range of motion.  Neurological: She is alert and oriented to person, place, and time.  Skin: No rash noted.  Psychiatric: Mood and affect normal.    Lab Results Lab Results  Component Value Date   WBC 4.4 06/02/2015   HGB 13.0 06/02/2015   HCT 38.5 06/02/2015   MCV 93.7 06/02/2015   PLT 228 06/02/2015    Lab Results  Component Value Date   CREATININE 0.97 06/02/2015   BUN 13 06/02/2015   NA 142 06/02/2015   K 3.7 06/02/2015   CL 106  06/02/2015   CO2 26 06/02/2015    Lab Results  Component Value Date   ALT 29 05/22/2015   AST 29 05/22/2015   ALKPHOS 71 05/22/2015   BILITOT 0.4 05/22/2015    Lab Results  Component Value Date   CHOL 191 02/01/2015   HDL 71 02/01/2015   LDLCALC 111 02/01/2015   TRIG 45 02/01/2015   CHOLHDL 2.7 02/01/2015    Lab Results HIV 1 RNA QUANT (copies/mL)  Date Value  10/18/2014 <20  01/04/2014 39*  10/11/2013 1508*   HIV-1 RNA VIRAL LOAD (no units)  Date Value  02/01/2015 <40  06/21/2014 <40  09/15/2013 2647  09/15/2013 2647   CD4 (no units)  Date Value  02/01/2015 332  09/15/2013 260  09/15/2013 260   CD4 T CELL ABS  Date  Value  10/11/2013 280 /uL*  08/24/2012 260 cmm*  02/13/2012 330 cmm*      Problem List Items Addressed This Visit      Medium   Dysphagia - Primary     Unprioritized   RESOLVED: Acid reflux   Relevant Medications   omeprazole (PRILOSEC) 20 MG capsule        Michel Bickers, MD [facility] for [facility] [place] [facility] [phone] pager   [phone] cell 06/07/2015, 4:38 PM

## 2015-06-07 NOTE — Telephone Encounter (Signed)
CMA called patient, patient didn't answer. Patient was left a message to return my call.

## 2015-06-09 ENCOUNTER — Other Ambulatory Visit: Payer: Self-pay | Admitting: Internal Medicine

## 2015-06-09 NOTE — Telephone Encounter (Signed)
-----   Message from [place] Donovan, [place] sent at 06/06/2015  4:30 PM EDT ----- Please call Ms. Carl- her blood work is looking much improved since her hospitalization.

## 2015-06-09 NOTE — Telephone Encounter (Addendum)
CMA called patient, patient verified name and DOB. Patient was given lab results, verbalized she understood, with no further questions.

## 2015-06-20 ENCOUNTER — Encounter (INDEPENDENT_AMBULATORY_CARE_PROVIDER_SITE_OTHER): Payer: Self-pay | Admitting: *Deleted

## 2015-06-20 VITALS — BP 145/85 | HR 82 | Temp 97.5°F | Resp 16 | Wt 196.0 lb

## 2015-06-20 DIAGNOSIS — Z006 Encounter for examination for normal comparison and control in clinical research program: Secondary | ICD-10-CM

## 2015-06-20 LAB — HIV-1 RNA QUANT-NO REFLEX-BLD: HIV-1 RNA Viral Load: <40

## 2015-06-20 NOTE — Progress Notes (Signed)
Krystn here for her week 168 visit for [place] Study: A Long Term follow-up of Older HIV-Infected Adults in the ACTG, an observational study addressing the issues of aging, HIV infection and Inflammation and month 20 for Reprieve A Randomized Trial to Prevent Vascular Events in HIV. Hospitalized in March for bronchitis. Denies any SOB, chest tightness or congestion at this time. Breath sounds clear to ascultation. No other complaints verbalized. Next Reprieve study visit is scheduled for August.

## 2015-07-04 ENCOUNTER — Ambulatory Visit (INDEPENDENT_AMBULATORY_CARE_PROVIDER_SITE_OTHER): Payer: Self-pay | Admitting: Internal Medicine

## 2015-07-04 VITALS — BP 132/83 | HR 98 | Temp 97.9°F | Wt 196.0 lb

## 2015-07-04 DIAGNOSIS — J4530 Mild persistent asthma, uncomplicated: Secondary | ICD-10-CM

## 2015-07-04 DIAGNOSIS — B20 Human immunodeficiency virus [HIV] disease: Secondary | ICD-10-CM

## 2015-07-04 DIAGNOSIS — K219 Gastro-esophageal reflux disease without esophagitis: Secondary | ICD-10-CM

## 2015-07-04 NOTE — Assessment & Plan Note (Signed)
I instructed her to use her Dulera inhaler twice daily as instructed. She will contact the [facility] to get a primary care provider.

## 2015-07-04 NOTE — Progress Notes (Signed)
Patient Active Problem List   Diagnosis Date Noted  . Human immunodeficiency virus (HIV) disease ([place]) 08/12/2006    Priority: High  . Dysphagia 06/07/2015    Priority: Medium  . Asthmatic bronchitis 05/21/2015    Priority: Medium  . Dyslipidemia 03/30/2013    Priority: Medium  . Essential hypertension 03/29/2009    Priority: Medium  . GERD 01/13/2006    Priority: Medium  . HIV (human immunodeficiency virus infection) ([place])   . Status asthmaticus 05/21/2015  . Hypokalemia 05/21/2015  . Left foot pain 01/18/2014  . Rectal bleeding 06/26/2010  . LOW BACK PAIN, ACUTE 01/16/2010  . LESION, VAGINA 10/05/2009  . PAPANICOLAOU SMEAR OF VAGINA WITH LGSIL 01/25/2009  . VIRAL MENINGITIS 09/14/2008  . DEPRESSION 03/22/2008  . DENTAL CARIES 09/21/2007  . GROSS HEMATURIA 08/26/2007  . DEGENERATIVE JOINT DISEASE 08/12/2006  . GENITAL HERPES 01/13/2006  . ANEMIA 01/13/2006  . DEPENDENCE, COCAINE, UNSPECIFIED 01/13/2006  . ALLERGIC RHINITIS 01/13/2006  . PREGNANCY, ECTOPIC NOS W/O INTRAUTERINE PRG 01/13/2006  . ABORTION NOS W/PELVIC DAMAGE, UNSPECIFIED 01/13/2006  . ANGIOEDEMA 01/13/2006  . BUNIONECTOMY, HX OF 01/13/2006    Patient's Medications  New Prescriptions   No medications on file  Previous Medications   ABACAVIR-DOLUTEGRAVIR-LAMIVUD 600-50-300 MG TABS    Take 1 tablet by mouth daily.   ALBUTEROL (PROVENTIL HFA;VENTOLIN HFA) 108 (90 BASE) MCG/ACT INHALER    Inhale 2 puffs into the lungs every 6 (six) hours as needed for wheezing or shortness of breath.   INVESTIGATIONAL - STUDY MEDICATION    Take 1 tablet by mouth daily. Reported on 06/02/2015   LISINOPRIL (PRINIVIL,ZESTRIL) 10 MG TABLET    TAKE 1 TABLET BY MOUTH EVERY DAY   MOMETASONE-FORMOTEROL (DULERA) 100-5 MCG/ACT AERO    Inhale 2 puffs into the lungs 2 (two) times daily.   OMEPRAZOLE (PRILOSEC) 20 MG CAPSULE    Take 1 capsule (20 mg total) by mouth daily.   PITAVASTATIN CALCIUM 4 MG TABS    Take 1 tablet (4  mg total) by mouth daily.   VALACYCLOVIR (VALTREX) 500 MG TABLET    TAKE 1 TABLET BY MOUTH THREE TIMES DAILY   VIREAD 300 MG TABLET    Take 300 mg by mouth daily.  Modified Medications   No medications on file  Discontinued Medications   AZITHROMYCIN (ZITHROMAX) 500 MG TABLET    Take 1 tablet (500 mg total) by mouth daily.   FAMOTIDINE (PEPCID) 40 MG TABLET    TAKE 1 TABLET BY MOUTH EVERY DAY    Subjective: [name] [name] is in with her boyfriend for routine HIV follow-up visit. She states that she is feeling a little bit better. After her visit one month ago she did start taking her Dulera inhaler. She has not needed her albuterol inhaler in the past month. However she is only using Dulera once daily. She states that she did not get her omeprazole from the pharmacy although it was sent to [facility]. She continues to take famotidine and states that her acid indigestion is a little bit better. She is trying to cut out drinking sodas which makes her acid reflux worse. She denies missing any doses of her Triumeq or viral read since her last visit. She has not called to get a primary care provider yet.   Review of Systems: Review of Systems  Constitutional: Negative for fever, chills, weight loss, malaise/fatigue and diaphoresis.  HENT: Negative for sore throat.  Respiratory: Negative for cough, sputum production and shortness of breath.   Cardiovascular: Negative for chest pain.  Gastrointestinal: Negative for nausea, vomiting, abdominal pain and diarrhea.  Genitourinary: Negative for dysuria and frequency.  Musculoskeletal: Negative for myalgias and joint pain.  Skin: Negative for rash.  Neurological: Negative for dizziness and headaches.  Psychiatric/Behavioral: Negative for depression and substance abuse. The patient is not nervous/anxious.     Past Medical History  Diagnosis Date  . HIV (human immunodeficiency virus infection) ([place]) 2007  . Hypertension   . GERD  (gastroesophageal reflux disease)     Social History  Substance Use Topics  . Smoking status: Never Smoker   . Smokeless tobacco: Never Used  . Alcohol Use: No    Family History  Problem Relation Age of Onset  . Coronary artery disease Mother   . Hypertension Mother   . Anxiety disorder Mother   . Arthritis Mother     rheumatoid  . Cancer Father     possible prostate cancer  . Diabetes Sister     Allergies  Allergen Reactions  . Sulfa Antibiotics Other (See Comments)    Blood pressure drops.  . Penicillins Rash    Objective:  Filed Vitals:   07/04/15 1053  BP: 132/83  Pulse: 98  Temp: 97.9 F (36.6 C)  TempSrc: Oral  Weight: 196 lb (88.905 kg)   Body mass index is 37.05 kg/(m^2).  Physical Exam  Constitutional: She is oriented to person, place, and time.  She is in good spirits today.  HENT:  Mouth/Throat: No oropharyngeal exudate.  Eyes: Conjunctivae are normal.  Cardiovascular: Normal rate and regular rhythm.   No murmur heard. Pulmonary/Chest: Breath sounds normal. She has no wheezes. She has no rales.  Abdominal: Soft. There is no tenderness.  Musculoskeletal: Normal range of motion.  Neurological: She is alert and oriented to person, place, and time.  Skin: No rash noted.  Psychiatric: Mood and affect normal.    Lab Results Lab Results  Component Value Date   WBC 4.4 06/02/2015   HGB 13.0 06/02/2015   HCT 38.5 06/02/2015   MCV 93.7 06/02/2015   PLT 228 06/02/2015    Lab Results  Component Value Date   CREATININE 0.97 06/02/2015   BUN 13 06/02/2015   NA 142 06/02/2015   K 3.7 06/02/2015   CL 106 06/02/2015   CO2 26 06/02/2015    Lab Results  Component Value Date   ALT 29 05/22/2015   AST 29 05/22/2015   ALKPHOS 71 05/22/2015   BILITOT 0.4 05/22/2015    Lab Results  Component Value Date   CHOL 191 02/01/2015   HDL 71 02/01/2015   LDLCALC 111 02/01/2015   TRIG 45 02/01/2015   CHOLHDL 2.7 02/01/2015    Lab Results HIV 1 RNA  QUANT (copies/mL)  Date Value  10/18/2014 <20  01/04/2014 39*  10/11/2013 1508*   HIV-1 RNA VIRAL LOAD (no units)  Date Value  02/01/2015 <40  06/21/2014 <40  09/15/2013 2647  09/15/2013 2647   CD4 (no units)  Date Value  02/01/2015 332  09/15/2013 260  09/15/2013 260   CD4 T CELL ABS  Date Value  10/11/2013 280 /uL*  08/24/2012 260 cmm*  02/13/2012 330 cmm*      Problem List Items Addressed This Visit      High   Human immunodeficiency virus (HIV) disease ([place]) - Primary    Her infection remains under very good control since restarting therapy in 2015. She  will continue her current antiretroviral regimen and follow-up in 6 months.        Medium   GERD    I asked her to call her [place] and request her omeprazole. I instructed her to take the omeprazole each morning and stopped taking famotidine.           Michel Bickers, MD [facility] for [facility] [place] [facility] [phone] pager   [phone] cell 07/04/2015, 11:30 AM

## 2015-07-04 NOTE — Assessment & Plan Note (Signed)
Her infection remains under very good control since restarting therapy in 2015. She will continue her current antiretroviral regimen and follow-up in 6 months.

## 2015-07-04 NOTE — Assessment & Plan Note (Signed)
I asked her to call her [place] and request her omeprazole. I instructed her to take the omeprazole each morning and stopped taking famotidine.

## 2015-07-14 ENCOUNTER — Encounter: Payer: Self-pay | Admitting: Internal Medicine

## 2015-08-10 ENCOUNTER — Ambulatory Visit: Payer: Medicare Other

## 2015-08-22 ENCOUNTER — Ambulatory Visit: Payer: Medicare Other | Admitting: Internal Medicine

## 2015-10-12 ENCOUNTER — Other Ambulatory Visit: Payer: Self-pay | Admitting: Internal Medicine

## 2015-10-12 DIAGNOSIS — B20 Human immunodeficiency virus [HIV] disease: Secondary | ICD-10-CM

## 2015-10-12 NOTE — Telephone Encounter (Signed)
Message left.  Pt needs to make follow-up appt with Dr. Megan Salon.

## 2016-01-09 ENCOUNTER — Ambulatory Visit: Payer: Medicare Other | Admitting: Internal Medicine

## 2016-01-09 ENCOUNTER — Telehealth: Payer: Self-pay | Admitting: *Deleted

## 2016-01-09 NOTE — Telephone Encounter (Signed)
Patient has moved permanently to [place].  Notified [facility].
